# Patient Record
Sex: Female | Born: 1945
Health system: Southern US, Community
[De-identification: ages and names within clinical notes are randomized; demographics above are authoritative.]

## PROBLEM LIST (undated history)

## (undated) DIAGNOSIS — IMO0001 Reserved for inherently not codable concepts without codable children: Secondary | ICD-10-CM

## (undated) DIAGNOSIS — M1990 Unspecified osteoarthritis, unspecified site: Secondary | ICD-10-CM

## (undated) DIAGNOSIS — N83209 Unspecified ovarian cyst, unspecified side: Secondary | ICD-10-CM

## (undated) DIAGNOSIS — K922 Gastrointestinal hemorrhage, unspecified: Secondary | ICD-10-CM

## (undated) DIAGNOSIS — K219 Gastro-esophageal reflux disease without esophagitis: Secondary | ICD-10-CM

## (undated) DIAGNOSIS — Z8711 Personal history of peptic ulcer disease: Secondary | ICD-10-CM

## (undated) DIAGNOSIS — E119 Type 2 diabetes mellitus without complications: Secondary | ICD-10-CM

## (undated) DIAGNOSIS — H269 Unspecified cataract: Secondary | ICD-10-CM

## (undated) DIAGNOSIS — T7840XA Allergy, unspecified, initial encounter: Secondary | ICD-10-CM

## (undated) DIAGNOSIS — Z972 Presence of dental prosthetic device (complete) (partial): Secondary | ICD-10-CM

## (undated) DIAGNOSIS — Z8719 Personal history of other diseases of the digestive system: Secondary | ICD-10-CM

## (undated) DIAGNOSIS — E039 Hypothyroidism, unspecified: Secondary | ICD-10-CM

## (undated) DIAGNOSIS — Z974 Presence of external hearing-aid: Secondary | ICD-10-CM

## (undated) DIAGNOSIS — D509 Iron deficiency anemia, unspecified: Secondary | ICD-10-CM

## (undated) DIAGNOSIS — K08109 Complete loss of teeth, unspecified cause, unspecified class: Secondary | ICD-10-CM

## (undated) DIAGNOSIS — N83201 Unspecified ovarian cyst, right side: Secondary | ICD-10-CM

## (undated) DIAGNOSIS — E785 Hyperlipidemia, unspecified: Secondary | ICD-10-CM

## (undated) DIAGNOSIS — N9489 Other specified conditions associated with female genital organs and menstrual cycle: Secondary | ICD-10-CM

## (undated) DIAGNOSIS — Z531 Procedure and treatment not carried out because of patient's decision for reasons of belief and group pressure: Secondary | ICD-10-CM

## (undated) DIAGNOSIS — I10 Essential (primary) hypertension: Secondary | ICD-10-CM

## (undated) DIAGNOSIS — F329 Major depressive disorder, single episode, unspecified: Secondary | ICD-10-CM

## (undated) DIAGNOSIS — R011 Cardiac murmur, unspecified: Secondary | ICD-10-CM

## (undated) HISTORY — DX: Gastrointestinal hemorrhage, unspecified: K92.2

## (undated) HISTORY — DX: Unspecified ovarian cyst, unspecified side: N83.209

## (undated) HISTORY — PX: OTHER SURGICAL HISTORY: SHX169

## (undated) HISTORY — DX: Type 2 diabetes mellitus without complications: E11.9

## (undated) HISTORY — PX: BREAST BIOPSY: SHX20

## (undated) HISTORY — DX: Essential (primary) hypertension: I10

## (undated) HISTORY — DX: Hyperlipidemia, unspecified: E78.5

## (undated) HISTORY — DX: Allergy, unspecified, initial encounter: T78.40XA

## (undated) HISTORY — PX: TONSILLECTOMY: SUR1361

## (undated) HISTORY — PX: WRIST SURGERY: SHX841

## (undated) HISTORY — DX: Unspecified cataract: H26.9

## (undated) HISTORY — PX: WISDOM TOOTH EXTRACTION: SHX21

## (undated) HISTORY — DX: Cardiac murmur, unspecified: R01.1

## (undated) HISTORY — DX: Iron deficiency anemia, unspecified: D50.9

## (undated) HISTORY — DX: Hypothyroidism, unspecified: E03.9

## (undated) HISTORY — PX: DILATION AND CURETTAGE OF UTERUS: SHX78

---

## 1988-01-12 HISTORY — PX: TUBAL LIGATION: SHX77

## 2001-01-11 HISTORY — PX: KNEE ARTHROSCOPY: SUR90

## 2011-01-12 HISTORY — PX: WRIST FRACTURE SURGERY: SHX121

## 2013-10-23 DIAGNOSIS — E785 Hyperlipidemia, unspecified: Secondary | ICD-10-CM | POA: Insufficient documentation

## 2016-11-17 DIAGNOSIS — M545 Low back pain, unspecified: Secondary | ICD-10-CM | POA: Insufficient documentation

## 2017-01-11 HISTORY — PX: COLONOSCOPY: SHX174

## 2017-05-24 DIAGNOSIS — M542 Cervicalgia: Secondary | ICD-10-CM | POA: Insufficient documentation

## 2018-02-10 DIAGNOSIS — F32A Depression, unspecified: Secondary | ICD-10-CM | POA: Insufficient documentation

## 2018-02-10 DIAGNOSIS — M1990 Unspecified osteoarthritis, unspecified site: Secondary | ICD-10-CM | POA: Insufficient documentation

## 2018-03-22 DIAGNOSIS — N83201 Unspecified ovarian cyst, right side: Secondary | ICD-10-CM | POA: Insufficient documentation

## 2018-12-11 ENCOUNTER — Telehealth: Payer: Self-pay | Admitting: Family Medicine

## 2018-12-14 ENCOUNTER — Ambulatory Visit: Payer: Self-pay | Admitting: Family Medicine

## 2018-12-14 ENCOUNTER — Encounter: Payer: Self-pay | Admitting: Family Medicine

## 2018-12-14 ENCOUNTER — Ambulatory Visit (INDEPENDENT_AMBULATORY_CARE_PROVIDER_SITE_OTHER): Payer: Medicare PPO | Admitting: Family Medicine

## 2018-12-14 ENCOUNTER — Other Ambulatory Visit: Payer: Self-pay

## 2018-12-14 VITALS — BP 130/78 | HR 98 | Ht 62.0 in | Wt 202.0 lb

## 2018-12-14 DIAGNOSIS — F320 Major depressive disorder, single episode, mild: Secondary | ICD-10-CM | POA: Diagnosis not present

## 2018-12-14 DIAGNOSIS — I10 Essential (primary) hypertension: Secondary | ICD-10-CM

## 2018-12-14 DIAGNOSIS — E039 Hypothyroidism, unspecified: Secondary | ICD-10-CM | POA: Diagnosis not present

## 2018-12-14 DIAGNOSIS — F5101 Primary insomnia: Secondary | ICD-10-CM

## 2018-12-14 DIAGNOSIS — IMO0001 Reserved for inherently not codable concepts without codable children: Secondary | ICD-10-CM | POA: Insufficient documentation

## 2018-12-14 DIAGNOSIS — E559 Vitamin D deficiency, unspecified: Secondary | ICD-10-CM

## 2018-12-14 DIAGNOSIS — E7800 Pure hypercholesterolemia, unspecified: Secondary | ICD-10-CM | POA: Insufficient documentation

## 2018-12-14 DIAGNOSIS — E118 Type 2 diabetes mellitus with unspecified complications: Secondary | ICD-10-CM

## 2018-12-14 DIAGNOSIS — Z789 Other specified health status: Secondary | ICD-10-CM

## 2018-12-14 DIAGNOSIS — Z531 Procedure and treatment not carried out because of patient's decision for reasons of belief and group pressure: Secondary | ICD-10-CM | POA: Insufficient documentation

## 2018-12-14 DIAGNOSIS — D509 Iron deficiency anemia, unspecified: Secondary | ICD-10-CM

## 2018-12-14 MED ORDER — TRAZODONE HCL 100 MG PO TABS: 100.0000 mg | ORAL_TABLET | Freq: Every day | ORAL | 1 refills | Status: DC

## 2018-12-14 NOTE — Progress Notes (Signed)
Established Patient Office Visit  Subjective:  Patient ID: Dana Nielsen, female    DOB: [birth year]  Age: 73 y.o. MRN: [id number]  CC:  Chief Complaint  Patient presents with  . Establish Care    HPI [name] presents for establishment of care.  Recently moved into the area in the last month or 2 from [facility].  She lives with her son and his wife.  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  She has donated over 2000 masks that she is made to area hospitals and charities.  Significant past medical history of hypertension, diabetes that is well controlled, elevated cholesterol, hypothyroidism, iron deficiency anemia, and vitamin D deficiency.  Blood pressure has been traditionally well controlled on her current regimen.  She uses the Lasix daily for swelling in her lower extremities.  She eats a banana daily in place of a potassium pill.  Diabetes has been well controlled with the Glucophage and Victoza.  She is trying to come off of the Victoza.  She prefers the trade Synthroid for control of her hypothyroidism.  It is worked better for her.  Hemoglobin has been down to 5 in the past and appears to be iron deficient.  She has had multiple colonoscopies and swallowed the pill camera.  No lesions have been found.  She has refused blood transfusion secondary to her religious convictions.  History of depression associated with her late husband's passing 5 years ago.  She feels like she could not come off of the low-dose Effexor.  She has taken trazodone for years and that is effective for her.  History reviewed. No pertinent past medical history.  History reviewed. No pertinent surgical history.  History reviewed. No pertinent family history.  Social History   Socioeconomic History  . Marital status: Widowed    Spouse name: Not on file  . Number of children: Not on file  . Years of education: Not on file  . Highest education level: Not on file   Occupational History  . Not on file  Social Needs  . Financial resource strain: Not on file  . Food insecurity    Worry: Not on file    Inability: Not on file  . Transportation needs    Medical: Not on file    Non-medical: Not on file  Tobacco Use  . Smoking status: Never Smoker  . Smokeless tobacco: Never Used  Substance and Sexual Activity  . Alcohol use: Not on file  . Drug use: Not on file  . Sexual activity: Not on file  Lifestyle  . Physical activity    Days per week: Not on file    Minutes per session: Not on file  . Stress: Not on file  Relationships  . Social Herbalist on phone: Not on file    Gets together: Not on file    Attends religious service: Not on file    Active member of club or organization: Not on file    Attends meetings of clubs or organizations: Not on file    Relationship status: Not on file  . Intimate partner violence    Fear of current or ex partner: Not on file    Emotionally abused: Not on file    Physically abused: Not on file    Forced sexual activity: Not on file  Other Topics Concern  . Not on file  Social History Narrative  . Not on file  Outpatient Medications Prior to Visit  Medication Sig Dispense Refill  . B Complex Vitamins (B COMPLEX 100 PO) Take 1 tablet by mouth daily.    . Cholecalciferol (VITAMIN D-3) 25 MCG (1000 UT) CAPS Take 1 capsule by mouth daily.    . furosemide (LASIX) 20 MG tablet Take 20 mg by mouth daily.    Marland Kitchen losartan (COZAAR) 25 MG tablet Take 25 mg by mouth daily.    . metFORMIN (GLUCOPHAGE) 1000 MG tablet Take 1,000 mg by mouth 2 (two) times daily with a meal.    . Multiple Vitamins-Minerals (CENTRUM SILVER 50+WOMEN) TABS Take 1 tablet by mouth daily.    . pantoprazole (PROTONIX) 20 MG tablet Take 20 mg by mouth daily.    . simvastatin (ZOCOR) 40 MG tablet Take 40 mg by mouth daily.    Marland Kitchen SYNTHROID 100 MCG tablet Take 100 mcg by mouth daily before breakfast.    . venlafaxine (EFFEXOR) 75 MG  tablet Take 75 mg by mouth 3 (three) times daily with meals.    . traZODone (DESYREL) 100 MG tablet Take 100 mg by mouth at bedtime.     No facility-administered medications prior to visit.     Allergies  Allergen Reactions  . Lidocaine Shortness Of Breath    ROS Review of Systems  Constitutional: Negative for chills, diaphoresis, fatigue, fever and unexpected weight change.  HENT: Negative.   Eyes: Positive for visual disturbance (bilateral cataracts). Negative for photophobia.  Respiratory: Negative.  Negative for chest tightness and shortness of breath.   Cardiovascular: Negative for chest pain and palpitations.  Gastrointestinal: Negative.  Negative for abdominal pain, anal bleeding and blood in stool.  Endocrine: Negative for cold intolerance, heat intolerance, polyphagia and polyuria.  Genitourinary: Negative for difficulty urinating, hematuria and urgency.  Musculoskeletal: Positive for arthralgias (knee pain).  Skin: Negative for pallor and rash.  Allergic/Immunologic: Negative for immunocompromised state.  Neurological: Negative for light-headedness and numbness.  Hematological: Does not bruise/bleed easily.  Psychiatric/Behavioral: Negative.       Objective:    Physical Exam  Constitutional: She is oriented to person, place, and time. She appears well-developed and well-nourished. No distress.  HENT:  Head: Normocephalic and atraumatic.  Right Ear: External ear normal.  Left Ear: External ear normal.  Mouth/Throat: Oropharynx is clear and moist. No oropharyngeal exudate.  Eyes: Pupils are equal, round, and reactive to light. Conjunctivae are normal. Right eye exhibits no discharge. Left eye exhibits no discharge. No scleral icterus.  Neck: Neck supple. No JVD present. No tracheal deviation present. No thyromegaly present.  Cardiovascular: Normal rate, regular rhythm and normal heart sounds.  Pulmonary/Chest: Effort normal and breath sounds normal. No stridor.   Abdominal: Bowel sounds are normal.  Musculoskeletal:        General: No edema.  Lymphadenopathy:    She has no cervical adenopathy.  Neurological: She is alert and oriented to person, place, and time.  Skin: Skin is warm and dry. She is not diaphoretic.  Psychiatric: She has a normal mood and affect. Her behavior is normal.    BP 130/78   Pulse 98   Ht _0  (1.575 m)   Wt 202 lb (91.6 kg)   SpO2 98%   BMI 36.95 kg/m  Wt Readings from Last 3 Encounters:  12/14/18 202 lb (91.6 kg)   BP Readings from Last 3 Encounters:  12/14/18 130/78   Guideline developer:  UpToDate (see UpToDate for funding source) Date Released: June 2014  Health Maintenance Due  Topic Date Due  . HEMOGLOBIN A1C  August 27, 1945  . Hepatitis C Screening  1945/06/01  . FOOT EXAM  01/13/1955  . OPHTHALMOLOGY EXAM  01/13/1955  . TETANUS/TDAP  01/13/1964  . MAMMOGRAM  01/13/1995  . COLONOSCOPY  01/13/1995  . DEXA SCAN  01/12/2010  . PNA vac Low Risk Adult (1 of 2 - PCV13) 01/12/2010    There are no preventive care reminders to display for this patient.  No results found for: TSH No results found for: WBC, HGB, HCT, MCV, PLT No results found for: NA, K, CHLORIDE, CO2, GLUCOSE, BUN, CREATININE, BILITOT, ALKPHOS, AST, ALT, PROT, ALBUMIN, CALCIUM, ANIONGAP, EGFR, GFR No results found for: CHOL No results found for: HDL No results found for: LDLCALC No results found for: TRIG No results found for: CHOLHDL No results found for: HGBA1C    Assessment & Plan:   Problem List Items Addressed This Visit      Cardiovascular and Mediastinum   Essential hypertension - Primary   Relevant Medications   losartan (COZAAR) 25 MG tablet   furosemide (LASIX) 20 MG tablet   simvastatin (ZOCOR) 40 MG tablet   Other Relevant Orders   CBC   Comp Met (CMET)   Urinalysis, Routine w reflex microscopic   Urine Microalbumin w/creat. ratio     Endocrine   Hypothyroidism   Relevant Medications   SYNTHROID 100 MCG  tablet   Other Relevant Orders   TSH   Controlled type 2 diabetes mellitus with complication, without long-term current use of insulin (HCC)   Relevant Medications   metFORMIN (GLUCOPHAGE) 1000 MG tablet   losartan (COZAAR) 25 MG tablet   simvastatin (ZOCOR) 40 MG tablet   Other Relevant Orders   CBC   Comp Met (CMET)   HgB A1c   Urinalysis, Routine w reflex microscopic   Urine Microalbumin w/creat. ratio   Ambulatory referral to Ophthalmology     Other   Primary insomnia   Relevant Medications   traZODone (DESYREL) 100 MG tablet   Patient is Jehovah's Witness   Iron deficiency anemia   Relevant Orders   CBC   Iron, TIBC and Ferritin Panel   Depression, major, single episode, mild (HCC)   Relevant Medications   venlafaxine (EFFEXOR) 75 MG tablet   traZODone (DESYREL) 100 MG tablet   Vitamin D deficiency   Relevant Orders   VITAMIN D 25 Hydroxy (Vit-D Deficiency, Fractures)   Elevated cholesterol   Relevant Medications   losartan (COZAAR) 25 MG tablet   furosemide (LASIX) 20 MG tablet   simvastatin (ZOCOR) 40 MG tablet   Other Relevant Orders   Comp Met (CMET)   Direct LDL   Lipid Profile      Meds ordered this encounter  Medications  . traZODone (DESYREL) 100 MG tablet    Sig: Take 1 tablet (100 mg total) by mouth at bedtime.    Dispense:  90 tablet    Refill:  1    Follow-up: Return in about 3 months (around 03/14/2019).   Will return fasting for above ordered blood work.

## 2018-12-14 NOTE — Patient Instructions (Signed)

## 2018-12-15 ENCOUNTER — Other Ambulatory Visit (INDEPENDENT_AMBULATORY_CARE_PROVIDER_SITE_OTHER): Payer: Medicare PPO

## 2018-12-15 DIAGNOSIS — E7800 Pure hypercholesterolemia, unspecified: Secondary | ICD-10-CM

## 2018-12-15 DIAGNOSIS — E559 Vitamin D deficiency, unspecified: Secondary | ICD-10-CM | POA: Diagnosis not present

## 2018-12-15 DIAGNOSIS — D509 Iron deficiency anemia, unspecified: Secondary | ICD-10-CM

## 2018-12-15 DIAGNOSIS — I10 Essential (primary) hypertension: Secondary | ICD-10-CM

## 2018-12-15 DIAGNOSIS — E039 Hypothyroidism, unspecified: Secondary | ICD-10-CM | POA: Diagnosis not present

## 2018-12-15 DIAGNOSIS — E118 Type 2 diabetes mellitus with unspecified complications: Secondary | ICD-10-CM | POA: Diagnosis not present

## 2018-12-15 LAB — HEMOGLOBIN A1C: Hgb A1c MFr Bld: 6.3 % (ref 4.6–6.5)

## 2018-12-15 LAB — LIPID PANEL
Cholesterol: 127 mg/dL (ref 0–200)
HDL: 35.9 mg/dL — ABNORMAL LOW (ref >39.00)
LDL Cholesterol: 69 mg/dL (ref 0–99)
NonHDL: 91.16
Total CHOL/HDL Ratio: 4
Triglycerides: 110 mg/dL (ref 0.0–149.0)
VLDL: 22 mg/dL (ref 0.0–40.0)

## 2018-12-15 LAB — COMPREHENSIVE METABOLIC PANEL
ALT: 29 U/L (ref 0–35)
AST: 20 U/L (ref 0–37)
Albumin: 4.3 g/dL (ref 3.5–5.2)
Alkaline Phosphatase: 50 U/L (ref 39–117)
BUN: 21 mg/dL (ref 6–23)
CO2: 25 mEq/L (ref 19–32)
Calcium: 9.3 mg/dL (ref 8.4–10.5)
Chloride: 106 mEq/L (ref 96–112)
Creatinine, Ser: 0.84 mg/dL (ref 0.40–1.20)
GFR: 66.29 mL/min (ref >60.00)
Glucose, Bld: 117 mg/dL — ABNORMAL HIGH (ref 70–99)
Potassium: 4 mEq/L (ref 3.5–5.1)
Sodium: 141 mEq/L (ref 135–145)
Total Bilirubin: 0.5 mg/dL (ref 0.2–1.2)
Total Protein: 6.4 g/dL (ref 6.0–8.3)

## 2018-12-15 LAB — CBC
HCT: 34.7 % — ABNORMAL LOW (ref 36.0–46.0)
Hemoglobin: 11.5 g/dL — ABNORMAL LOW (ref 12.0–15.0)
MCHC: 33 g/dL (ref 30.0–36.0)
MCV: 89.5 fl (ref 78.0–100.0)
Platelets: 196 10*3/uL (ref 150.0–400.0)
RBC: 3.88 Mil/uL (ref 3.87–5.11)
RDW: 14.2 % (ref 11.5–15.5)
WBC: 5.8 10*3/uL (ref 4.0–10.5)

## 2018-12-15 LAB — URINALYSIS, ROUTINE W REFLEX MICROSCOPIC
Bilirubin Urine: NEGATIVE
Hgb urine dipstick: NEGATIVE
Ketones, ur: NEGATIVE
Nitrite: NEGATIVE
RBC / HPF: NONE SEEN (ref 0–?)
Specific Gravity, Urine: 1.015 (ref 1.000–1.030)
Total Protein, Urine: NEGATIVE
Urine Glucose: NEGATIVE
Urobilinogen, UA: 0.2 (ref 0.0–1.0)
pH: 6 (ref 5.0–8.0)

## 2018-12-15 LAB — MICROALBUMIN / CREATININE URINE RATIO
Creatinine,U: 110.7 mg/dL
Microalb Creat Ratio: 3 mg/g (ref 0.0–30.0)
Microalb, Ur: 3.4 mg/dL — ABNORMAL HIGH (ref 0.0–1.9)

## 2018-12-15 LAB — LDL CHOLESTEROL, DIRECT: Direct LDL: 76 mg/dL

## 2018-12-15 LAB — TSH: TSH: 3.59 u[IU]/mL (ref 0.35–4.50)

## 2018-12-15 LAB — VITAMIN D 25 HYDROXY (VIT D DEFICIENCY, FRACTURES): VITD: 34.37 ng/mL (ref 30.00–100.00)

## 2018-12-16 LAB — IRON,TIBC AND FERRITIN PANEL
%SAT: 25 % (calc) (ref 16–45)
Ferritin: 368 ng/mL — ABNORMAL HIGH (ref 16–288)
Iron: 78 ug/dL (ref 45–160)
TIBC: 306 mcg/dL (calc) (ref 250–450)

## 2018-12-18 ENCOUNTER — Telehealth: Payer: Self-pay | Admitting: Family Medicine

## 2018-12-18 NOTE — Telephone Encounter (Signed)
Labs are okay. Continue all meds.

## 2018-12-18 NOTE — Telephone Encounter (Signed)
Patient inquiring about lab results states she needs to know today due to her symptoms not improving, please advise

## 2018-12-18 NOTE — Telephone Encounter (Signed)
WK-Plz see pt message/requesting results from labs completed on 12.4.20/plz advise/thx dmf

## 2018-12-18 NOTE — Telephone Encounter (Signed)
Patient calling for lab results from 12/15/2018

## 2018-12-18 NOTE — Telephone Encounter (Signed)
WK- 2nd call about labs from 12.4.20/states that her Sx are not improving/thx dmf

## 2018-12-18 NOTE — Telephone Encounter (Signed)
Pt calling again for lab results. Aware that we will call her as soon as these are resulted - aware Dr Ethelene Hal is in clinic and someone will call her as soon as these are looked at.

## 2018-12-19 ENCOUNTER — Telehealth: Payer: Self-pay | Admitting: Family Medicine

## 2018-12-19 NOTE — Telephone Encounter (Signed)
Called pt and informed her of Dr. Bebe Shaggy message. Pt understood and states she is already taking an iron and multivitamin. When I attempted to schedule pt for her lab visit, we got disconnected. Sent pt a mychart message to schedule lab visit when she can.

## 2018-12-19 NOTE — Telephone Encounter (Signed)
Dr. Ethelene Hal, I called and spoke with pt about her lab result. She is concerned about her hemoglobin level. She noticed it dropped from 13.5 to 11.5. She states in the past this level has dropped as low as in the 5s she wanted to know if this could be repeated this week to assure that it is staying steady in the 11s or not dropping any lower. Also she wanted to know if she could have a referral to a hematologist.

## 2018-12-19 NOTE — Telephone Encounter (Signed)
I LMOVM stating that all labs look good and to continue current regimen/thx dmf

## 2018-12-19 NOTE — Telephone Encounter (Signed)
Lets repeat it a few weeks. Please take iron and a multivitamin.

## 2018-12-20 NOTE — Telephone Encounter (Signed)
Pt responded to [employer]. Closing out this note

## 2019-01-02 ENCOUNTER — Telehealth: Payer: Self-pay | Admitting: Hematology and Oncology

## 2019-01-02 ENCOUNTER — Other Ambulatory Visit: Payer: Self-pay

## 2019-01-02 DIAGNOSIS — D509 Iron deficiency anemia, unspecified: Secondary | ICD-10-CM

## 2019-01-02 NOTE — Telephone Encounter (Signed)
Just the cbc

## 2019-01-02 NOTE — Telephone Encounter (Signed)
Order has been placed.

## 2019-01-02 NOTE — Telephone Encounter (Signed)
Dr. Ethelene Hal pt is coming for that repeat lab tomorrow due to her concern about her hemoglobin level from her cbc. Did you just want to repeat the cbc or did you want to do a different iron lab.

## 2019-01-02 NOTE — Telephone Encounter (Signed)
A new hem appt has been scheduled for Dana Nielsen to see Dr. Lorenso Courier on 12/7 at 1pm. Pt aware to arrive 15 minutes early.

## 2019-01-03 ENCOUNTER — Other Ambulatory Visit (INDEPENDENT_AMBULATORY_CARE_PROVIDER_SITE_OTHER): Payer: Medicare PPO

## 2019-01-03 ENCOUNTER — Other Ambulatory Visit: Payer: Self-pay

## 2019-01-03 DIAGNOSIS — D509 Iron deficiency anemia, unspecified: Secondary | ICD-10-CM

## 2019-01-03 LAB — CBC
HCT: 37.6 % (ref 36.0–46.0)
Hemoglobin: 12.2 g/dL (ref 12.0–15.0)
MCHC: 32.4 g/dL (ref 30.0–36.0)
MCV: 91.1 fl (ref 78.0–100.0)
Platelets: 193 10*3/uL (ref 150.0–400.0)
RBC: 4.13 Mil/uL (ref 3.87–5.11)
RDW: 13.9 % (ref 11.5–15.5)
WBC: 6.3 10*3/uL (ref 4.0–10.5)

## 2019-01-18 ENCOUNTER — Inpatient Hospital Stay: Payer: Medicare HMO | Attending: Hematology and Oncology | Admitting: Hematology and Oncology

## 2019-01-18 ENCOUNTER — Inpatient Hospital Stay: Payer: Medicare HMO

## 2019-01-18 ENCOUNTER — Other Ambulatory Visit: Payer: Self-pay

## 2019-01-18 VITALS — BP 154/82 | HR 83 | Temp 97.4°F | Resp 18 | Ht 62.0 in | Wt 202.6 lb

## 2019-01-18 DIAGNOSIS — E039 Hypothyroidism, unspecified: Secondary | ICD-10-CM | POA: Diagnosis not present

## 2019-01-18 DIAGNOSIS — E119 Type 2 diabetes mellitus without complications: Secondary | ICD-10-CM | POA: Insufficient documentation

## 2019-01-18 DIAGNOSIS — E785 Hyperlipidemia, unspecified: Secondary | ICD-10-CM | POA: Diagnosis not present

## 2019-01-18 DIAGNOSIS — Z79899 Other long term (current) drug therapy: Secondary | ICD-10-CM | POA: Insufficient documentation

## 2019-01-18 DIAGNOSIS — Z7984 Long term (current) use of oral hypoglycemic drugs: Secondary | ICD-10-CM | POA: Diagnosis not present

## 2019-01-18 DIAGNOSIS — D500 Iron deficiency anemia secondary to blood loss (chronic): Secondary | ICD-10-CM

## 2019-01-18 DIAGNOSIS — D509 Iron deficiency anemia, unspecified: Secondary | ICD-10-CM | POA: Diagnosis not present

## 2019-01-18 LAB — CBC WITH DIFFERENTIAL (CANCER CENTER ONLY)
Abs Immature Granulocytes: 0.03 10*3/uL (ref 0.00–0.07)
Basophils Absolute: 0.1 10*3/uL (ref 0.0–0.1)
Basophils Relative: 1 %
Eosinophils Absolute: 0.2 10*3/uL (ref 0.0–0.5)
Eosinophils Relative: 4 %
HCT: 38.9 % (ref 36.0–46.0)
Hemoglobin: 12.5 g/dL (ref 12.0–15.0)
Immature Granulocytes: 1 %
Lymphocytes Relative: 36 %
Lymphs Abs: 2.2 10*3/uL (ref 0.7–4.0)
MCH: 29.8 pg (ref 26.0–34.0)
MCHC: 32.1 g/dL (ref 30.0–36.0)
MCV: 92.6 fL (ref 80.0–100.0)
Monocytes Absolute: 0.6 10*3/uL (ref 0.1–1.0)
Monocytes Relative: 9 %
Neutro Abs: 3.1 10*3/uL (ref 1.7–7.7)
Neutrophils Relative %: 49 %
Platelet Count: 193 10*3/uL (ref 150–400)
RBC: 4.2 MIL/uL (ref 3.87–5.11)
RDW: 13.3 % (ref 11.5–15.5)
WBC Count: 6.2 10*3/uL (ref 4.0–10.5)
nRBC: 0 % (ref 0.0–0.2)

## 2019-01-18 NOTE — Progress Notes (Signed)
[place] [phone]   Fax:([phone] [facility] NOTE  Patient Care Team: Libby Maw, MD as PCP - General (Family Medicine)  Hematological/Oncological History #Iron Deficiency Anemia 1) 2019: presented to [facility] in [facility] with symptoms of anemia. Found to have Hgb 5.0. EGD/colon/capsule reported found no source of bleed 2) 01/18/2019: establish care with Dr. Lorenso Courier   CHIEF COMPLAINTS/PURPOSE OF CONSULTATION:  History of Iron Deficiency anemia   HISTORY OF PRESENTING ILLNESS:  Dana Nielsen 74 y.o. female with medical history significant for hypothyroidism, DM type II, and HLD who presents for evaluation of her history of iron deficiency anemia.  No records are currently available from when patient the patient lived in [place].  All medical history is obtained from the patient herself rather than from concrete medical records.  We are in the process of attempting to obtain these records from [employer] and [employer].  The patient reports that she has a history of iron deficiency anemia.  She notes that she had a severe episode in 2019 when she presented to a local hospital with hemoglobin had dropped to 5.  She underwent 2 colonoscopies and 2 endoscopies over 2-week period and was found to have no clear source of bleeding.  She also endorses that she underwent a capsule endoscopy which showed no signs of a GI bleed.  On discussion today the patient notes that she feels well.  She denies having any shortness of breath, overt signs of bleeding, dark stools, or other signs of GI bleed.  She is currently taking Floradix over-the-counter for iron supplementation.  She notes that she has been having issues with constipation her entire life.  For which she now takes milk of magnesia.  Other medical conditions she have includes hypothyroidism for which she is attempting to get established with an endocrinologist  and type 2 diabetes which is currently under control with diet.  A full 10 point ROS is listed below.  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  She was expressly clear with me that even in life-threatening situations she would avoid the transfusion of blood.  She also notes that she is a never smoker but occasionally drinks alcohol.  Her family history is remarkable for multiple myeloma in her mother lung cancer in her sister and numerous cancer in her father's siblings.  She notes that she has 9 children 2 of which are adopted and they are all healthy.  MEDICAL HISTORY:  Past Medical History:  Diagnosis Date  . Diabetes mellitus type 2, diet-controlled ([place])   . HLD (hyperlipidemia)   . Hypothyroidism   . Iron deficiency anemia     SURGICAL HISTORY: Past Surgical History:  Procedure Laterality Date  . arthroscopic knee    . C sections     x 7  . TONSILLECTOMY    . WISDOM TOOTH EXTRACTION    . WRIST SURGERY      SOCIAL HISTORY: Social History   Socioeconomic History  . Marital status: Widowed    Spouse name: Not on file  . Number of children: 77  . Years of education: Not on file  . Highest education level: Not on file  Occupational History  . Not on file  Tobacco Use  . Smoking status: Never Smoker  . Smokeless tobacco: Never Used  Substance and Sexual Activity  . Alcohol use: Yes  Comment: Rarely  . Drug use: Never  . Sexual activity: Not on file  Other Topics Concern  . Not on file  Social History Narrative  . Not on file   Social Determinants of Health   Financial Resource Strain:   . Difficulty of Paying Living Expenses: Not on file  Food Insecurity:   . Worried About Charity fundraiser in the Last Year: Not on file  . Ran Out of Food in the Last Year: Not on file  Transportation Needs:   . Lack of Transportation (Medical): Not on file  . Lack of Transportation  (Non-Medical): Not on file  Physical Activity:   . Days of Exercise per Week: Not on file  . Minutes of Exercise per Session: Not on file  Stress:   . Feeling of Stress : Not on file  Social Connections:   . Frequency of Communication with Friends and Family: Not on file  . Frequency of Social Gatherings with Friends and Family: Not on file  . Attends Religious Services: Not on file  . Active Member of Clubs or Organizations: Not on file  . Attends Archivist Meetings: Not on file  . Marital Status: Not on file  Intimate Partner Violence:   . Fear of Current or Ex-Partner: Not on file  . Emotionally Abused: Not on file  . Physically Abused: Not on file  . Sexually Abused: Not on file    FAMILY HISTORY: Family History  Problem Relation Age of Onset  . Multiple myeloma Mother   . Heart attack Father   . Lung cancer Sister     ALLERGIES:  is allergic to lidocaine.  MEDICATIONS:  Current Outpatient Medications  Medication Sig Dispense Refill  . liraglutide (VICTOZA) 18 MG/3ML SOPN Inject 1.8 mg into the skin every morning.    . B Complex Vitamins (B COMPLEX 100 PO) Take 1 tablet by mouth daily.    . Cholecalciferol (VITAMIN D-3) 25 MCG (1000 UT) CAPS Take 1 capsule by mouth daily.    . furosemide (LASIX) 20 MG tablet Take 20 mg by mouth daily.    Marland Kitchen losartan (COZAAR) 25 MG tablet Take 25 mg by mouth daily.    . metFORMIN (GLUCOPHAGE) 1000 MG tablet Take 1,000 mg by mouth 2 (two) times daily with a meal.    . Multiple Vitamins-Minerals (CENTRUM SILVER 50+WOMEN) TABS Take 1 tablet by mouth daily.    . pantoprazole (PROTONIX) 20 MG tablet Take 20 mg by mouth daily.    . simvastatin (ZOCOR) 40 MG tablet Take 40 mg by mouth daily.    Marland Kitchen SYNTHROID 100 MCG tablet Take 100 mcg by mouth daily before breakfast.    . traZODone (DESYREL) 100 MG tablet Take 1 tablet (100 mg total) by mouth at bedtime. 90 tablet 1  . venlafaxine (EFFEXOR) 75 MG tablet Take 75 mg by mouth 3 (three)  times daily with meals.     No current facility-administered medications for this visit.    REVIEW OF SYSTEMS:   Constitutional: ( - ) fevers, ( - )  chills , ( - ) night sweats Eyes: ( - ) blurriness of vision, ( - ) double vision, ( - ) watery eyes Ears, nose, mouth, throat, and face: ( - ) mucositis, ( - ) sore throat Respiratory: ( - ) cough, ( - ) dyspnea, ( - ) wheezes Cardiovascular: ( - ) palpitation, ( - ) chest discomfort, ( - ) lower extremity swelling Gastrointestinal:  ( - )  nausea, ( - ) heartburn, ( - ) change in bowel habits Skin: ( - ) abnormal skin rashes Lymphatics: ( - ) new lymphadenopathy, ( - ) easy bruising Neurological: ( - ) numbness, ( - ) tingling, ( - ) new weaknesses Behavioral/Psych: ( - ) mood change, ( - ) new changes  All other systems were reviewed with the patient and are negative.  PHYSICAL EXAMINATION: ECOG PERFORMANCE STATUS: 0 - Asymptomatic  Vitals:   01/18/19 1326  BP: (!) 154/82  Pulse: 83  Resp: 18  Temp: (!) 97.4 F (36.3 C)  SpO2: 98%   Filed Weights   01/18/19 1326  Weight: 202 lb 9.6 oz (91.9 kg)    GENERAL: well appearing elderly Caucasian female in NAD  SKIN: skin color, texture, turgor are normal, no rashes or significant lesions EYES: conjunctiva are pink and non-injected, sclera clear LUNGS: clear to auscultation and percussion with normal breathing effort HEART: regular rate & rhythm and no murmurs and no lower extremity edema ABDOMEN: soft, non-tender, non-distended, normal bowel sounds Musculoskeletal: no cyanosis of digits and no clubbing  PSYCH: alert & oriented x 3, fluent speech NEURO: no focal motor/sensory deficits  LABORATORY DATA:  I have reviewed the data as listed Recent Results (from the past 2160 hour(s))  VITAMIN D 25 Hydroxy (Vit-D Deficiency, Fractures)     Status: None   Collection Time: 12/15/18 10:58 AM  Result Value Ref Range   VITD 34.37 30.00 - 100.00 ng/mL  Urine Microalbumin w/creat.  ratio     Status: Abnormal   Collection Time: 12/15/18 10:58 AM  Result Value Ref Range   Microalb, Ur 3.4 (H) 0.0 - 1.9 mg/dL   Creatinine,U 110.7 mg/dL   Microalb Creat Ratio 3.0 0.0 - 30.0 mg/g  TSH     Status: None   Collection Time: 12/15/18 10:58 AM  Result Value Ref Range   TSH 3.59 0.35 - 4.50 uIU/mL  Urinalysis, Routine w reflex microscopic     Status: Abnormal   Collection Time: 12/15/18 10:58 AM  Result Value Ref Range   Color, Urine YELLOW Yellow;Lt. Yellow;Straw;Dark Yellow;Amber;Green;Red;Brown   APPearance CLEAR Clear;Turbid;Slightly Cloudy;Cloudy   Specific Gravity, Urine 1.015 1.000 - 1.030   pH 6.0 5.0 - 8.0   Total Protein, Urine NEGATIVE Negative   Urine Glucose NEGATIVE Negative   Ketones, ur NEGATIVE Negative   Bilirubin Urine NEGATIVE Negative   Hgb urine dipstick NEGATIVE Negative   Urobilinogen, UA 0.2 0.0 - 1.0   Leukocytes,Ua TRACE (A) Negative   Nitrite NEGATIVE Negative   WBC, UA 0-2/hpf 0-2/hpf   RBC / HPF none seen 0-2/hpf   Squamous Epithelial / LPF Rare(0-4/hpf) Rare(0-4/hpf)  Iron, TIBC and Ferritin Panel     Status: Abnormal   Collection Time: 12/15/18 10:58 AM  Result Value Ref Range   Iron 78 45 - 160 mcg/dL   TIBC 306 250 - 450 mcg/dL (calc)   %SAT 25 16 - 45 % (calc)   Ferritin 368 (H) 16 - 288 ng/mL  Lipid Profile     Status: Abnormal   Collection Time: 12/15/18 10:58 AM  Result Value Ref Range   Cholesterol 127 0 - 200 mg/dL    Comment: ATP III Classification       Desirable:  < 200 mg/dL               Borderline High:  200 - 239 mg/dL          High:  > = 240 mg/dL  Triglycerides 110.0 0.0 - 149.0 mg/dL    Comment: Normal:  <150 mg/dLBorderline High:  150 - 199 mg/dL   HDL 35.90 (L) >39.00 mg/dL   VLDL 22.0 0.0 - 40.0 mg/dL   LDL Cholesterol 69 0 - 99 mg/dL   Total CHOL/HDL Ratio 4     Comment:                Men          Women1/2 Average Risk     3.4          3.3Average Risk          5.0          4.42X Average Risk          9.6           7.13X Average Risk          15.0          11.0                       NonHDL 91.16     Comment: NOTE:  Non-HDL goal should be 30 mg/dL higher than patient's LDL goal (i.e. LDL goal of < 70 mg/dL, would have non-HDL goal of < 100 mg/dL)  HgB A1c     Status: None   Collection Time: 12/15/18 10:58 AM  Result Value Ref Range   Hgb A1c MFr Bld 6.3 4.6 - 6.5 %    Comment: Glycemic Control Guidelines for People with Diabetes:Non Diabetic:  <6%Goal of Therapy: <7%Additional Action Suggested:  >8%   Direct LDL     Status: None   Collection Time: 12/15/18 10:58 AM  Result Value Ref Range   Direct LDL 76.0 mg/dL    Comment: Optimal:  <100 mg/dLNear or Above Optimal:  100-129 mg/dLBorderline High:  130-159 mg/dLHigh:  160-189 mg/dLVery High:  >190 mg/dL  Comp Met (CMET)     Status: Abnormal   Collection Time: 12/15/18 10:58 AM  Result Value Ref Range   Sodium 141 135 - 145 mEq/L   Potassium 4.0 3.5 - 5.1 mEq/L   Chloride 106 96 - 112 mEq/L   CO2 25 19 - 32 mEq/L   Glucose, Bld 117 (H) 70 - 99 mg/dL   BUN 21 6 - 23 mg/dL   Creatinine, Ser 0.84 0.40 - 1.20 mg/dL   Total Bilirubin 0.5 0.2 - 1.2 mg/dL   Alkaline Phosphatase 50 39 - 117 U/L   AST 20 0 - 37 U/L   ALT 29 0 - 35 U/L   Total Protein 6.4 6.0 - 8.3 g/dL   Albumin 4.3 3.5 - 5.2 g/dL   GFR 66.29 >60.00 mL/min   Calcium 9.3 8.4 - 10.5 mg/dL  CBC     Status: Abnormal   Collection Time: 12/15/18 10:58 AM  Result Value Ref Range   WBC 5.8 4.0 - 10.5 K/uL   RBC 3.88 3.87 - 5.11 Mil/uL   Platelets 196.0 150.0 - 400.0 K/uL   Hemoglobin 11.5 (L) 12.0 - 15.0 g/dL   HCT 34.7 (L) 36.0 - 46.0 %   MCV 89.5 78.0 - 100.0 fl   MCHC 33.0 30.0 - 36.0 g/dL   RDW 14.2 11.5 - 15.5 %  CBC     Status: None   Collection Time: 01/03/19  2:12 PM  Result Value Ref Range   WBC 6.3 4.0 - 10.5 K/uL   RBC 4.13 3.87 - 5.11 Mil/uL   Platelets 193.0  150.0 - 400.0 K/uL   Hemoglobin 12.2 12.0 - 15.0 g/dL   HCT 37.6 36.0 - 46.0 %   MCV 91.1 78.0 - 100.0  fl   MCHC 32.4 30.0 - 36.0 g/dL   RDW 13.9 11.5 - 15.5 %  CBC with Differential (Cancer Center Only)     Status: None   Collection Time: 01/18/19  2:48 PM  Result Value Ref Range   WBC Count 6.2 4.0 - 10.5 K/uL   RBC 4.20 3.87 - 5.11 MIL/uL   Hemoglobin 12.5 12.0 - 15.0 g/dL   HCT 38.9 36.0 - 46.0 %   MCV 92.6 80.0 - 100.0 fL   MCH 29.8 26.0 - 34.0 pg   MCHC 32.1 30.0 - 36.0 g/dL   RDW 13.3 11.5 - 15.5 %   Platelet Count 193 150 - 400 K/uL   nRBC 0.0 0.0 - 0.2 %   Neutrophils Relative % 49 %   Neutro Abs 3.1 1.7 - 7.7 K/uL   Lymphocytes Relative 36 %   Lymphs Abs 2.2 0.7 - 4.0 K/uL   Monocytes Relative 9 %   Monocytes Absolute 0.6 0.1 - 1.0 K/uL   Eosinophils Relative 4 %   Eosinophils Absolute 0.2 0.0 - 0.5 K/uL   Basophils Relative 1 %   Basophils Absolute 0.1 0.0 - 0.1 K/uL   Immature Granulocytes 1 %   Abs Immature Granulocytes 0.03 0.00 - 0.07 K/uL    Comment: Performed at [facility], [place] [address] [place], [place] [address]    PATHOLOGY: None to review.   RADIOGRAPHIC STUDIES: None relevant to review.  ASSESSMENT & PLAN Dana Nielsen 74 y.o. female with medical history significant for hypothyroidisim, DM type II, and HLD who presents for evaluation of her history of iron deficiency anemia.  After review of her labs from December discussion with the patient her findings are consistent with a normal and healthy level of hemoglobin and iron.  I have relayed this to the patient who notes that she is concerned about the prior episode of anemia that she had and would like to continue close monitoring to assure that she does not develop this anemia again.    The bulk of her concern comes and the fact that she is a Sales promotion account executive Witness and therefore not be able to receive any blood products in the event of a severe bleed.  As such I think it would be reasonable to assess her labs in approximately 3 months time with a clinic visit in 6 months time.   Although an individual who is otherwise healthy this is more frequent than may be required, the patient notes this would be her preference, to follow closely with a hematologist in the event that she were to develop any blood issues that someone could be familiar with her case.  I do believe at this time this is a reasonable course forward.  #History Iron Deficiency Anemia --the patient currently has no anemia with replete iron stores --the patient notes she is concerned about her prior drop in Hgb without a source and the fact that she is a Jehovah's Witness and could not receive blood products in the event of a severe anemia --as such she has requested routine monitoring of her blood. We agreed that q 3 months checks with an interval 6 month clinic visit would be appropriate. --RTC in 6 months with interval lab check in 3 months.   #Jehovah's Witness --the patient is not to receive blood  products under any circumstances, per her request. We have copied and uploaded her Blood Contract which she carries into her wallet into our system. --in the event the patient is admitted with severe anemia or bleed please make the Hematology service aware so that we may provide bloodless options for support.   Orders Placed This Encounter  Procedures  . CBC with Differential (Cancer Center Only)    Standing Status:   Future    Number of Occurrences:   1    Standing Expiration Date:   01/18/2020  . CBC with Differential (Cancer Center Only)    Standing Status:   Future    Standing Expiration Date:   01/21/2020  . Iron and TIBC    Standing Status:   Future    Standing Expiration Date:   01/21/2020  . Ferritin    Standing Status:   Future    Standing Expiration Date:   01/21/2020    All questions were answered. The patient knows to call the clinic with any problems, questions or concerns.  A total of more than 45 minutes were spent on this encounter and over half of that time was spent on counseling and  coordination of care as outlined above.   Ledell Peoples, MD Department of Hematology/Oncology [place] [facility] Phone: [phone] Pager: [phone] Email: [email or web address]  01/21/2019 4:25 PM

## 2019-01-21 ENCOUNTER — Encounter: Payer: Self-pay | Admitting: Hematology and Oncology

## 2019-01-22 ENCOUNTER — Telehealth: Payer: Self-pay | Admitting: *Deleted

## 2019-01-22 NOTE — Telephone Encounter (Signed)
-----   Message from Orson Slick, MD sent at 01/21/2019  3:58 PM EST ----- Eustaquio Maize,  Please let Mrs. Souter know her Hgb was normal. We will check her again in 3 months time with a repeat clinic visit in 6 months time.   Colan Neptune  ----- Message ----- From: Buel Ream, Lab In [place] Sent: 01/18/2019   2:57 PM EST To: Orson Slick, MD

## 2019-01-22 NOTE — Telephone Encounter (Signed)
TCT patient regarding her lab results from last week.  Spoke with pt.  Informed her that HGB was normal and that we would re-check her labs in 3 months and see her here in the clinic in 6 months.  She voiced understanding. Advised that she can call [phone] with any questions or concerns

## 2019-01-29 ENCOUNTER — Telehealth: Payer: Self-pay | Admitting: Family Medicine

## 2019-01-29 NOTE — Telephone Encounter (Signed)
Patient is calling and wanted to speak to Dr. Ethelene Hal regarding whether or not she should get the Covid 19 Vaccine. CB [phone]

## 2019-01-30 NOTE — Telephone Encounter (Signed)
Pt aware and will schedule to have covid vaccine.

## 2019-01-30 NOTE — Telephone Encounter (Signed)
Lidocaine will not be involved with her vaccine. Okay to take vaccine and please do.

## 2019-01-30 NOTE — Telephone Encounter (Signed)
Spoke with pt who states that she would like to speak with Dr. Ethelene Hal to know what is his thoughts on her having the covid vaccine due to her having a bad reaction to lidocaine in the past she would like to know if there were any extra precaution she needs to do? Please advise.

## 2019-02-14 DIAGNOSIS — H25813 Combined forms of age-related cataract, bilateral: Secondary | ICD-10-CM | POA: Diagnosis not present

## 2019-02-14 DIAGNOSIS — E119 Type 2 diabetes mellitus without complications: Secondary | ICD-10-CM | POA: Diagnosis not present

## 2019-02-14 DIAGNOSIS — H35371 Puckering of macula, right eye: Secondary | ICD-10-CM | POA: Diagnosis not present

## 2019-02-21 ENCOUNTER — Encounter (HOSPITAL_COMMUNITY): Payer: Self-pay | Admitting: Emergency Medicine

## 2019-02-21 ENCOUNTER — Ambulatory Visit (HOSPITAL_COMMUNITY)
Admission: EM | Admit: 2019-02-21 | Discharge: 2019-02-21 | Disposition: A | Discharge status: Home / Self Care | Payer: Medicare HMO | Attending: Urgent Care | Admitting: Urgent Care

## 2019-02-21 ENCOUNTER — Other Ambulatory Visit: Payer: Self-pay

## 2019-02-21 DIAGNOSIS — J3089 Other allergic rhinitis: Secondary | ICD-10-CM

## 2019-02-21 DIAGNOSIS — J0191 Acute recurrent sinusitis, unspecified: Secondary | ICD-10-CM

## 2019-02-21 MED ORDER — PREDNISONE 20 MG PO TABS: 20.0000 mg | ORAL_TABLET | Freq: Every day | ORAL | 0 refills | Status: DC

## 2019-02-21 MED ORDER — AMOXICILLIN 875 MG PO TABS: 875.0000 mg | ORAL_TABLET | Freq: Two times a day (BID) | ORAL | 0 refills | Status: DC

## 2019-02-21 NOTE — ED Triage Notes (Signed)
Headache and runny nose started last week. She got COVID vaccine Friday. Headache and runny nose started prior to vaccine. She now has left ear pain.

## 2019-02-21 NOTE — ED Provider Notes (Signed)
Dana Nielsen   MRN: [id number] DOB: [birth year]  Subjective:   Dana Nielsen is a 74 y.o. female presenting for 2 week hx of runny nose, sinus headaches, now having left ear pain with throat pain of the left side. Headache is superior/temporal. Has had to use APAP with temporary relief. She is coughing from post-nasal drainage. Has had difficulty with allergies, used to take cetirizine but has not started that this year. Denies direct COVID 19 contacts/exposure. Has practiced social distancing strictly.  No current facility-administered medications for this encounter.  Current Outpatient Medications:  .  B Complex Vitamins (B COMPLEX 100 PO), Take 1 tablet by mouth daily., Disp: , Rfl:  .  Cholecalciferol (VITAMIN D-3) 25 MCG (1000 UT) CAPS, Take 1 capsule by mouth daily., Disp: , Rfl:  .  furosemide (LASIX) 20 MG tablet, Take 20 mg by mouth daily., Disp: , Rfl:  .  liraglutide (VICTOZA) 18 MG/3ML SOPN, Inject 1.8 mg into the skin every morning., Disp: , Rfl:  .  losartan (COZAAR) 25 MG tablet, Take 25 mg by mouth daily., Disp: , Rfl:  .  metFORMIN (GLUCOPHAGE) 1000 MG tablet, Take 1,000 mg by mouth 2 (two) times daily with a meal., Disp: , Rfl:  .  Multiple Vitamins-Minerals (CENTRUM SILVER 50+WOMEN) TABS, Take 1 tablet by mouth daily., Disp: , Rfl:  .  pantoprazole (PROTONIX) 20 MG tablet, Take 20 mg by mouth daily., Disp: , Rfl:  .  simvastatin (ZOCOR) 40 MG tablet, Take 40 mg by mouth daily., Disp: , Rfl:  .  SYNTHROID 100 MCG tablet, Take 100 mcg by mouth daily before breakfast., Disp: , Rfl:  .  traZODone (DESYREL) 100 MG tablet, Take 1 tablet (100 mg total) by mouth at bedtime., Disp: 90 tablet, Rfl: 1 .  venlafaxine (EFFEXOR) 75 MG tablet, Take 75 mg by mouth 3 (three) times daily with meals., Disp: , Rfl:    Allergies  Allergen Reactions  . Lidocaine Shortness Of Breath    Past Medical History:  Diagnosis Date  . Diabetes mellitus type 2, diet-controlled ([place])   .  HLD (hyperlipidemia)   . Hypothyroidism   . Iron deficiency anemia      Past Surgical History:  Procedure Laterality Date  . arthroscopic knee    . C sections     x 7  . TONSILLECTOMY    . WISDOM TOOTH EXTRACTION    . WRIST SURGERY      Family History  Problem Relation Age of Onset  . Multiple myeloma Mother   . Heart attack Father   . Lung cancer Sister     Social History   Tobacco Use  . Smoking status: Never Smoker  . Smokeless tobacco: Never Used  Substance Use Topics  . Alcohol use: Yes    Comment: Rarely  . Drug use: Never    ROS Denies chest pain, shob, wheezing.   Objective:   Vitals: BP (!) 150/81   Pulse 80   Temp 98 F (36.7 C) (Oral)   Resp 16   SpO2 97%   Physical Exam Constitutional:      General: She is not in acute distress.    Appearance: Normal appearance. She is well-developed. She is not ill-appearing, toxic-appearing or diaphoretic.  HENT:     Head: Normocephalic and atraumatic.     Right Ear: Tympanic membrane and ear canal normal. No drainage or tenderness. No middle ear effusion. Tympanic membrane is not erythematous.     Left Ear: Tympanic  membrane and ear canal normal. No drainage or tenderness.  No middle ear effusion. Tympanic membrane is not erythematous.     Nose: Congestion present. No rhinorrhea.     Mouth/Throat:     Mouth: Mucous membranes are moist. No oral lesions.     Pharynx: No pharyngeal swelling, oropharyngeal exudate, posterior oropharyngeal erythema or uvula swelling.     Tonsils: No tonsillar exudate or tonsillar abscesses.     Comments: Thick streaks of pnd overlying pharynx. Eyes:     General: No scleral icterus.    Extraocular Movements: Extraocular movements intact.     Right eye: Normal extraocular motion.     Left eye: Normal extraocular motion.     Conjunctiva/sclera: Conjunctivae normal.     Pupils: Pupils are equal, round, and reactive to light.  Cardiovascular:     Rate and Rhythm: Normal rate  and regular rhythm.     Pulses: Normal pulses.     Heart sounds: Normal heart sounds. No murmur. No friction rub. No gallop.   Pulmonary:     Effort: Pulmonary effort is normal. No respiratory distress.     Breath sounds: Normal breath sounds. No stridor. No wheezing, rhonchi or rales.  Musculoskeletal:     Cervical back: Normal range of motion and neck supple.  Lymphadenopathy:     Cervical: No cervical adenopathy.  Skin:    General: Skin is warm and dry.     Findings: No rash.  Neurological:     General: No focal deficit present.     Mental Status: She is alert and oriented to person, place, and time.     Cranial Nerves: No cranial nerve deficit.     Motor: No weakness.     Coordination: Coordination abnormal (ambulates with cane).  Psychiatric:        Mood and Affect: Mood normal.        Behavior: Behavior normal.        Thought Content: Thought content normal.        Judgment: Judgment normal.      Assessment and Plan :   1. Acute recurrent sinusitis, unspecified location   2. Allergic rhinitis due to other allergic trigger, unspecified seasonality     Will cover for sinusitis with amoxicillin. Restart Zyrtec. If no improvement in 2-3 days use prednisone course to address underlying allergic rhinitis.  Low risk for COVID-19, patient prefers to defer testing.  Counseled patient on potential for adverse effects with medications prescribed/recommended today, ER and return-to-clinic precautions discussed, patient verbalized understanding.   Dana Nielsen, [place] 02/21/19 1720

## 2019-02-21 NOTE — Discharge Instructions (Signed)
If you have no improvement with amoxicillin in 2-3 days, then please start the low dose prednisone to help augment your treatment.

## 2019-02-26 ENCOUNTER — Encounter (HOSPITAL_COMMUNITY): Payer: Self-pay

## 2019-02-26 ENCOUNTER — Ambulatory Visit (HOSPITAL_COMMUNITY)
Admission: EM | Admit: 2019-02-26 | Discharge: 2019-02-26 | Disposition: A | Discharge status: Home / Self Care | Payer: Medicare HMO | Attending: Family Medicine | Admitting: Family Medicine

## 2019-02-26 ENCOUNTER — Telehealth: Payer: Self-pay

## 2019-02-26 ENCOUNTER — Other Ambulatory Visit: Payer: Self-pay

## 2019-02-26 DIAGNOSIS — Z862 Personal history of diseases of the blood and blood-forming organs and certain disorders involving the immune mechanism: Secondary | ICD-10-CM | POA: Diagnosis not present

## 2019-02-26 LAB — CBC
HCT: 35.7 % — ABNORMAL LOW (ref 36.0–46.0)
Hemoglobin: 11.5 g/dL — ABNORMAL LOW (ref 12.0–15.0)
MCH: 29.4 pg (ref 26.0–34.0)
MCHC: 32.2 g/dL (ref 30.0–36.0)
MCV: 91.3 fL (ref 80.0–100.0)
Platelets: 241 10*3/uL (ref 150–400)
RBC: 3.91 MIL/uL (ref 3.87–5.11)
RDW: 14 % (ref 11.5–15.5)
WBC: 8.1 10*3/uL (ref 4.0–10.5)
nRBC: 0 % (ref 0.0–0.2)

## 2019-02-26 NOTE — Telephone Encounter (Signed)
Received call from patient requesting labs in order to know her Hgb level. Patient reports having a history of blood loss and believes she is currently anemic because of the s/sx she is currently having such as very very dark stool and severe leg cramps. She reports that she takes oral iron pills but this is not listed on her medication list. She insists that her stool is darker than normal. Patient is requesting lab work this week.  Please advise.

## 2019-02-26 NOTE — Discharge Instructions (Addendum)
Call your doctor tomorrow  Results for KIERAH, FRUTOS (MRN [id number]) as of 02/26/2019 18:31  Ref. Range 01/18/2019 14:48 02/26/2019 17:52  WBC Latest Ref Range: 4.0 - 10.5 K/uL 6.2 8.1  RBC Latest Ref Range: 3.87 - 5.11 MIL/uL 4.20 3.91  Hemoglobin Latest Ref Range: 12.0 - 15.0 g/dL 12.5 11.5 (L)  HCT Latest Ref Range: 36.0 - 46.0 % 38.9 35.7 (L)  MCV Latest Ref Range: 80.0 - 100.0 fL 92.6 91.3  MCH Latest Ref Range: 26.0 - 34.0 pg 29.8 29.4  MCHC Latest Ref Range: 30.0 - 36.0 g/dL 32.1 32.2  RDW Latest Ref Range: 11.5 - 15.5 % 13.3 14.0  Platelets Latest Ref Range: 150 - 400 K/uL 193 241  nRBC Latest Ref Range: 0.0 - 0.2 % 0.0 0.0

## 2019-02-26 NOTE — Telephone Encounter (Signed)
TCT patient regarding Lab appointment for this week, called to advise that a scheduler would be calling her for appointment for tomorrow. Patient states that she can't wait that long and is going to an urgent care this afternoon.  Dr. Lorenso Courier made aware. Will follow up with patient on Wednesday 2/17.

## 2019-02-26 NOTE — ED Triage Notes (Signed)
Pt state she needs blood work for her anemia. Pt states she's been dizzy and has dark stools this has been going on sense last Wednesday.

## 2019-02-26 NOTE — ED Provider Notes (Signed)
[facility]    CSN: [id number] Arrival date & time: 02/26/19  1630      History   Chief Complaint Chief Complaint  Patient presents with  . anemic / blood work    HPI Dana Nielsen is a 74 y.o. female.   HPI   Patient states she has a history of iron deficiency anemia.  She states that she is had hemoglobins as low as 5 that required hospitalizations.  She states that colonoscopies, endoscopies, and a capsule study have failed to identify the cause of her bleeding.  She states that it is theorized that she has a vascular malformation. She states that she has had darker stool than usual for the last 5 days.  Not black, not bloody.  She also states that when she woke up this morning she stood up and felt slightly lightheaded.  She associates the symptoms as possibly from recurrence of her anemia.  She is here requesting a blood count. She is well controlled diabetes.  Well-controlled hypothyroidism.  Well-controlled blood pressure.  She is compliant with her medication.  Compliant with her doctors visits.  Past Medical History:  Diagnosis Date  . Diabetes mellitus type 2, diet-controlled ([place])   . HLD (hyperlipidemia)   . Hypothyroidism   . Iron deficiency anemia     Patient Active Problem List   Diagnosis Date Noted  . Primary insomnia 12/14/2018  . Patient is Jehovah's Witness 12/14/2018  . Iron deficiency anemia 12/14/2018  . Hypothyroidism 12/14/2018  . Depression, major, single episode, mild ([place]) 12/14/2018  . Controlled type 2 diabetes mellitus with complication, without long-term current use of insulin ([place]) 12/14/2018  . Essential hypertension 12/14/2018  . Vitamin D deficiency 12/14/2018  . Elevated cholesterol 12/14/2018    Past Surgical History:  Procedure Laterality Date  . arthroscopic knee    . C sections     x 7  . TONSILLECTOMY    . WISDOM TOOTH EXTRACTION    . WRIST SURGERY      OB History   No obstetric history on file.       Home Medications    Prior to Admission medications   Medication Sig Start Date End Date Taking? Authorizing Provider  B Complex Vitamins (B COMPLEX 100 PO) Take 1 tablet by mouth daily.    [provider]  Cholecalciferol (VITAMIN D-3) 25 MCG (1000 UT) CAPS Take 1 capsule by mouth daily.    [provider]  furosemide (LASIX) 20 MG tablet Take 20 mg by mouth daily.    [provider]  liraglutide (VICTOZA) 18 MG/3ML SOPN Inject 1.8 mg into the skin every morning.    [provider]  losartan (COZAAR) 25 MG tablet Take 25 mg by mouth daily.    [provider]  metFORMIN (GLUCOPHAGE) 1000 MG tablet Take 1,000 mg by mouth 2 (two) times daily with a meal.    [provider]  Multiple Vitamins-Minerals (CENTRUM SILVER 50+WOMEN) TABS Take 1 tablet by mouth daily.    [provider]  pantoprazole (PROTONIX) 20 MG tablet Take 20 mg by mouth daily.    [provider]  predniSONE (DELTASONE) 20 MG tablet Take 1 tablet (20 mg total) by mouth daily with breakfast. 02/21/19   Jaynee Eagles, PA-C  simvastatin (ZOCOR) 40 MG tablet Take 40 mg by mouth daily.    [provider]  SYNTHROID 100 MCG tablet Take 100 mcg by mouth daily before breakfast.    [provider]  traZODone (DESYREL) 100 MG tablet Take 1 tablet (100 mg total) by mouth at bedtime. 12/14/18   Libby Maw, MD  venlafaxine (EFFEXOR) 75 MG tablet Take 75 mg by mouth 3 (three) times daily with meals.    [provider]    Family History Family History  Problem Relation Age of Onset  . Multiple myeloma Mother   . Heart attack Father   . Lung cancer Sister     Social History Social History   Tobacco Use  . Smoking status: Never Smoker  . Smokeless tobacco: Never Used  Substance Use Topics  . Alcohol use: Yes    Comment: Rarely  . Drug use: Never     Allergies   Lidocaine   Review of Systems Review of Systems   Gastrointestinal: Positive for constipation.       Dark stools, occasional constipation  Neurological: Positive for dizziness and light-headedness. Negative for headaches.     Physical Exam Triage Vital Signs ED Triage Vitals  Enc Vitals Group     BP 02/26/19 1659 (!) 142/77     Pulse Rate 02/26/19 1659 99     Resp 02/26/19 1659 16     Temp 02/26/19 1659 98.2 F (36.8 C)     Temp Source 02/26/19 1659 Oral     SpO2 --      Weight 02/26/19 1659 195 lb (88.5 kg)     Height --      Head Circumference --      Peak Flow --      Pain Score 02/26/19 1834 0     Pain Loc --      Pain Edu? --      Excl. in [place]? --    No data found.  Updated Vital Signs BP (!) 142/77 (BP Location: Right Arm)   Pulse 99   Temp 98.2 F (36.8 C) (Oral)   Resp 16   Wt 88.5 kg   BMI 35.67 kg/m      Physical Exam Constitutional:      General: She is not in acute distress.    Appearance: She is well-developed.  HENT:     Head: Normocephalic and atraumatic.     Mouth/Throat:     Comments: Mask in place Eyes:     Conjunctiva/sclera: Conjunctivae normal.     Pupils: Pupils are equal, round, and reactive to light.  Cardiovascular:     Rate and Rhythm: Normal rate and regular rhythm.     Heart sounds: Normal heart sounds.  Pulmonary:     Effort: Pulmonary effort is normal. No respiratory distress.  Musculoskeletal:        General: Normal range of motion.     Cervical back: Normal range of motion.  Skin:    General: Skin is warm and dry.     Coloration: Skin is not pale.  Neurological:     Mental Status: She is alert.  Psychiatric:        Behavior: Behavior normal.        UC Treatments / Results  Labs (all labs ordered are listed, but only abnormal results are displayed) Labs Reviewed  CBC - Abnormal; Notable for the following components:      Result Value   Hemoglobin 11.5 (*)    HCT 35.7 (*)    All other components within normal limits    EKG   Radiology No results  found.  Procedures Procedures (including critical care time)  Medications Ordered in UC Medications -  No data to display  Initial Impression / Assessment and Plan / UC Course  I have reviewed the triage vital signs and the nursing notes.  Pertinent labs & imaging results that were available during my care of the patient were reviewed by me and considered in my medical decision making (see chart for details).     Patient's hemoglobin has dropped from 12.5-11.5 in the last month.  I advised her to follow-up with her PCP tomorrow. Final Clinical Impressions(s) / UC Diagnoses   Final diagnoses:  History of anemia     Discharge Instructions      Call your doctor tomorrow  Results for Dana, Nielsen (MRN [id number]) as of 02/26/2019 18:31  Ref. Range 01/18/2019 14:48 02/26/2019 17:52  WBC Latest Ref Range: 4.0 - 10.5 K/uL 6.2 8.1  RBC Latest Ref Range: 3.87 - 5.11 MIL/uL 4.20 3.91  Hemoglobin Latest Ref Range: 12.0 - 15.0 g/dL 12.5 11.5 (L)  HCT Latest Ref Range: 36.0 - 46.0 % 38.9 35.7 (L)  MCV Latest Ref Range: 80.0 - 100.0 fL 92.6 91.3  MCH Latest Ref Range: 26.0 - 34.0 pg 29.8 29.4  MCHC Latest Ref Range: 30.0 - 36.0 g/dL 32.1 32.2  RDW Latest Ref Range: 11.5 - 15.5 % 13.3 14.0  Platelets Latest Ref Range: 150 - 400 K/uL 193 241  nRBC Latest Ref Range: 0.0 - 0.2 % 0.0 0.0     ED Prescriptions    None     PDMP not reviewed this encounter.   Raylene Everts, MD 02/26/19 [phone]

## 2019-02-27 ENCOUNTER — Telehealth: Payer: Self-pay

## 2019-02-27 NOTE — Telephone Encounter (Signed)
TC from Pt stating her HGB results were 11.5 Pt. Stated she felt ok and wanted to know when she should check her HGB again Per Dr. Lorenso Courier left Vm message to Pt to inform her that if she feels like she is having too many dark stools and would like to get her blood work done just give a call and we will schedule a lab appointment for her. But as of right now her next lab  appointment is scheduled for 04/19/19 at 215 p.

## 2019-03-02 ENCOUNTER — Ambulatory Visit: Payer: Medicare HMO | Admitting: Family Medicine

## 2019-03-05 ENCOUNTER — Ambulatory Visit (INDEPENDENT_AMBULATORY_CARE_PROVIDER_SITE_OTHER): Payer: Medicare HMO | Admitting: Family Medicine

## 2019-03-05 ENCOUNTER — Other Ambulatory Visit: Payer: Self-pay

## 2019-03-05 ENCOUNTER — Encounter: Payer: Self-pay | Admitting: Family Medicine

## 2019-03-05 VITALS — BP 122/70 | HR 85 | Temp 96.0°F | Ht 62.0 in | Wt 203.4 lb

## 2019-03-05 DIAGNOSIS — K921 Melena: Secondary | ICD-10-CM | POA: Diagnosis not present

## 2019-03-05 LAB — CBC
HCT: 31.5 % — ABNORMAL LOW (ref 36.0–46.0)
Hemoglobin: 10.2 g/dL — ABNORMAL LOW (ref 12.0–15.0)
MCHC: 32.3 g/dL (ref 30.0–36.0)
MCV: 90.1 fl (ref 78.0–100.0)
Platelets: 237 10*3/uL (ref 150.0–400.0)
RBC: 3.49 Mil/uL — ABNORMAL LOW (ref 3.87–5.11)
RDW: 14.6 % (ref 11.5–15.5)
WBC: 6.1 10*3/uL (ref 4.0–10.5)

## 2019-03-05 NOTE — Addendum Note (Signed)
Addended by: Jon Billings on: 03/05/2019 04:57 PM   Modules accepted: Orders

## 2019-03-05 NOTE — Progress Notes (Addendum)
Established Patient Office Visit  Subjective:  Patient ID: Dana Nielsen, female    DOB: Jan 30, 1945  Age: 74 y.o. MRN: [id number]  CC:  Chief Complaint  Patient presents with  . Acute Visit    c/o of dark stools x 2 weeks.     HPI Dana Nielsen presents for evaluation of recent melena.  Patient denies hematochezia.  Longstanding history of iron deficiency anemia.  Dyspepsia is controlled with Protonix.  Status post extensive GI work-up with multiple colonoscopies camera endoscopy.  She is unable to take iron secondary to its constipating side effect.  She denies lightheadedness or dizziness.  She has scheduled follow-up with hematology next week.  Past Medical History:  Diagnosis Date  . Diabetes mellitus type 2, diet-controlled ([place])   . HLD (hyperlipidemia)   . Hypothyroidism   . Iron deficiency anemia     Past Surgical History:  Procedure Laterality Date  . arthroscopic knee    . C sections     x 7  . TONSILLECTOMY    . WISDOM TOOTH EXTRACTION    . WRIST SURGERY      Family History  Problem Relation Age of Onset  . Multiple myeloma Mother   . Heart attack Father   . Lung cancer Sister     Social History   Socioeconomic History  . Marital status: Widowed    Spouse name: Not on file  . Number of children: 55  . Years of education: Not on file  . Highest education level: Not on file  Occupational History  . Not on file  Tobacco Use  . Smoking status: Never Smoker  . Smokeless tobacco: Never Used  Substance and Sexual Activity  . Alcohol use: Yes    Comment: Rarely  . Drug use: Never  . Sexual activity: Not on file  Other Topics Concern  . Not on file  Social History Narrative  . Not on file   Social Determinants of Health   Financial Resource Strain:   . Difficulty of Paying Living Expenses: Not on file  Food Insecurity:   . Worried About Charity fundraiser in the Last Year: Not on file  . Ran Out of Food in the Last Year: Not on file    Transportation Needs:   . Lack of Transportation (Medical): Not on file  . Lack of Transportation (Non-Medical): Not on file  Physical Activity:   . Days of Exercise per Week: Not on file  . Minutes of Exercise per Session: Not on file  Stress:   . Feeling of Stress : Not on file  Social Connections:   . Frequency of Communication with Friends and Family: Not on file  . Frequency of Social Gatherings with Friends and Family: Not on file  . Attends Religious Services: Not on file  . Active Member of Clubs or Organizations: Not on file  . Attends Archivist Meetings: Not on file  . Marital Status: Not on file  Intimate Partner Violence:   . Fear of Current or Ex-Partner: Not on file  . Emotionally Abused: Not on file  . Physically Abused: Not on file  . Sexually Abused: Not on file    Outpatient Medications Prior to Visit  Medication Sig Dispense Refill  . furosemide (LASIX) 20 MG tablet Take 20 mg by mouth daily.    Marland Kitchen liraglutide (VICTOZA) 18 MG/3ML SOPN Inject 1.8 mg into the skin every morning.    Marland Kitchen losartan (COZAAR) 25 MG tablet Take  25 mg by mouth daily.    . metFORMIN (GLUCOPHAGE) 1000 MG tablet Take 1,000 mg by mouth 2 (two) times daily with a meal.    . pantoprazole (PROTONIX) 20 MG tablet Take 20 mg by mouth daily.    . simvastatin (ZOCOR) 40 MG tablet Take 40 mg by mouth daily.    Marland Kitchen SYNTHROID 100 MCG tablet Take 100 mcg by mouth daily before breakfast.    . traZODone (DESYREL) 100 MG tablet Take 1 tablet (100 mg total) by mouth at bedtime. 90 tablet 1  . venlafaxine (EFFEXOR) 75 MG tablet Take 75 mg by mouth 3 (three) times daily with meals.    . B Complex Vitamins (B COMPLEX 100 PO) Take 1 tablet by mouth daily.    . Cholecalciferol (VITAMIN D-3) 25 MCG (1000 UT) CAPS Take 1 capsule by mouth daily.    . Multiple Vitamins-Minerals (CENTRUM SILVER 50+WOMEN) TABS Take 1 tablet by mouth daily.    . predniSONE (DELTASONE) 20 MG tablet Take 1 tablet (20 mg total) by  mouth daily with breakfast. (Patient not taking: Reported on 03/05/2019) 7 tablet 0   No facility-administered medications prior to visit.    Allergies  Allergen Reactions  . Lidocaine Shortness Of Breath    ROS Review of Systems  Constitutional: Negative.   HENT: Negative.   Eyes: Negative for photophobia and visual disturbance.  Respiratory: Negative.   Cardiovascular: Negative.   Gastrointestinal: Positive for constipation. Negative for abdominal pain, anal bleeding, blood in stool, nausea and vomiting.  Endocrine: Negative for polyphagia and polyuria.  Genitourinary: Negative.  Negative for hematuria.  Allergic/Immunologic: Negative for immunocompromised state.  Neurological: Negative for dizziness, light-headedness and numbness.      Objective:    Physical Exam  Constitutional: She is oriented to person, place, and time. She appears well-developed and well-nourished. No distress.  HENT:  Head: Normocephalic and atraumatic.  Right Ear: External ear normal.  Left Ear: External ear normal.  Eyes: Conjunctivae are normal. Right eye exhibits no discharge. Left eye exhibits no discharge. No scleral icterus.  Neck: No JVD present. No tracheal deviation present.  Cardiovascular: Normal rate, regular rhythm and normal heart sounds.  Pulmonary/Chest: Effort normal and breath sounds normal. No stridor.  Genitourinary: Rectum:     Guaiac result positive.     No rectal mass, anal fissure, tenderness or abnormal anal tone.   Neurological: She is alert and oriented to person, place, and time.  Skin: Skin is warm and dry. She is not diaphoretic.  Psychiatric: She has a normal mood and affect. Her behavior is normal.    BP 122/70   Pulse 85   Temp (!) 96 F (35.6 C) (Tympanic)   Ht '5\' 2"'  (1.575 m)   Wt 203 lb 6.4 oz (92.3 kg)   SpO2 99%   BMI 37.20 kg/m  Wt Readings from Last 3 Encounters:  03/05/19 203 lb 6.4 oz (92.3 kg)  02/26/19 195 lb (88.5 kg)  01/18/19 202 lb 9.6  oz (91.9 kg)     Health Maintenance Due  Topic Date Due  . Hepatitis C Screening  Apr 13, 1945  . FOOT EXAM  01/13/1955  . OPHTHALMOLOGY EXAM  01/13/1955  . TETANUS/TDAP  01/13/1964  . MAMMOGRAM  01/13/1995  . COLONOSCOPY  01/13/1995  . DEXA SCAN  01/12/2010  . PNA vac Low Risk Adult (1 of 2 - PCV13) 01/12/2010    There are no preventive care reminders to display for this patient.  Lab Results  Component Value Date   TSH 3.59 12/15/2018   Lab Results  Component Value Date   WBC 6.1 03/05/2019   HGB 10.2 (L) 03/05/2019   HCT 31.5 (L) 03/05/2019   MCV 90.1 03/05/2019   PLT 237.0 03/05/2019   Lab Results  Component Value Date   NA 141 12/15/2018   K 4.0 12/15/2018   CO2 25 12/15/2018   GLUCOSE 117 (H) 12/15/2018   BUN 21 12/15/2018   CREATININE 0.84 12/15/2018   BILITOT 0.5 12/15/2018   ALKPHOS 50 12/15/2018   AST 20 12/15/2018   ALT 29 12/15/2018   PROT 6.4 12/15/2018   ALBUMIN 4.3 12/15/2018   CALCIUM 9.3 12/15/2018   GFR 66.29 12/15/2018   Lab Results  Component Value Date   CHOL 127 12/15/2018   Lab Results  Component Value Date   HDL 35.90 (L) 12/15/2018   Lab Results  Component Value Date   LDLCALC 69 12/15/2018   Lab Results  Component Value Date   TRIG 110.0 12/15/2018   Lab Results  Component Value Date   CHOLHDL 4 12/15/2018   Lab Results  Component Value Date   HGBA1C 6.3 12/15/2018      Assessment & Plan:   Problem List Items Addressed This Visit      Digestive   Melena - Primary   Relevant Orders   CBC (Completed)   Iron, TIBC and Ferritin Panel   Ambulatory referral to Hematology      No orders of the defined types were placed in this encounter.   Follow-up: Return Plan for Follow up with hematology next week.Libby Maw, MD

## 2019-03-05 NOTE — Patient Instructions (Signed)

## 2019-03-06 ENCOUNTER — Telehealth: Payer: Self-pay | Admitting: Hematology and Oncology

## 2019-03-06 ENCOUNTER — Encounter (HOSPITAL_COMMUNITY): Payer: Self-pay | Admitting: *Deleted

## 2019-03-06 ENCOUNTER — Telehealth: Payer: Self-pay | Admitting: Family Medicine

## 2019-03-06 ENCOUNTER — Inpatient Hospital Stay (HOSPITAL_COMMUNITY)
Admission: EM | Admit: 2019-03-06 | Discharge: 2019-03-09 | DRG: 377 | Disposition: A | Discharge status: Home / Self Care | Payer: Medicare HMO | Attending: Internal Medicine | Admitting: Internal Medicine

## 2019-03-06 DIAGNOSIS — K824 Cholesterolosis of gallbladder: Secondary | ICD-10-CM | POA: Diagnosis not present

## 2019-03-06 DIAGNOSIS — Z79899 Other long term (current) drug therapy: Secondary | ICD-10-CM

## 2019-03-06 DIAGNOSIS — D62 Acute posthemorrhagic anemia: Secondary | ICD-10-CM | POA: Diagnosis present

## 2019-03-06 DIAGNOSIS — D500 Iron deficiency anemia secondary to blood loss (chronic): Secondary | ICD-10-CM | POA: Diagnosis not present

## 2019-03-06 DIAGNOSIS — I10 Essential (primary) hypertension: Secondary | ICD-10-CM | POA: Diagnosis present

## 2019-03-06 DIAGNOSIS — K922 Gastrointestinal hemorrhage, unspecified: Secondary | ICD-10-CM | POA: Diagnosis not present

## 2019-03-06 DIAGNOSIS — K449 Diaphragmatic hernia without obstruction or gangrene: Secondary | ICD-10-CM

## 2019-03-06 DIAGNOSIS — Z789 Other specified health status: Secondary | ICD-10-CM | POA: Diagnosis not present

## 2019-03-06 DIAGNOSIS — Z20822 Contact with and (suspected) exposure to covid-19: Secondary | ICD-10-CM | POA: Diagnosis present

## 2019-03-06 DIAGNOSIS — Z8249 Family history of ischemic heart disease and other diseases of the circulatory system: Secondary | ICD-10-CM | POA: Diagnosis not present

## 2019-03-06 DIAGNOSIS — Z807 Family history of other malignant neoplasms of lymphoid, hematopoietic and related tissues: Secondary | ICD-10-CM | POA: Diagnosis not present

## 2019-03-06 DIAGNOSIS — K2990 Gastroduodenitis, unspecified, without bleeding: Secondary | ICD-10-CM

## 2019-03-06 DIAGNOSIS — Z884 Allergy status to anesthetic agent status: Secondary | ICD-10-CM

## 2019-03-06 DIAGNOSIS — K3189 Other diseases of stomach and duodenum: Secondary | ICD-10-CM | POA: Diagnosis not present

## 2019-03-06 DIAGNOSIS — K259 Gastric ulcer, unspecified as acute or chronic, without hemorrhage or perforation: Secondary | ICD-10-CM | POA: Diagnosis not present

## 2019-03-06 DIAGNOSIS — K921 Melena: Secondary | ICD-10-CM

## 2019-03-06 DIAGNOSIS — F329 Major depressive disorder, single episode, unspecified: Secondary | ICD-10-CM | POA: Diagnosis present

## 2019-03-06 DIAGNOSIS — K254 Chronic or unspecified gastric ulcer with hemorrhage: Secondary | ICD-10-CM | POA: Diagnosis present

## 2019-03-06 DIAGNOSIS — R160 Hepatomegaly, not elsewhere classified: Secondary | ICD-10-CM

## 2019-03-06 DIAGNOSIS — E118 Type 2 diabetes mellitus with unspecified complications: Secondary | ICD-10-CM | POA: Diagnosis present

## 2019-03-06 DIAGNOSIS — IMO0001 Reserved for inherently not codable concepts without codable children: Secondary | ICD-10-CM | POA: Diagnosis present

## 2019-03-06 DIAGNOSIS — K3182 Dieulafoy lesion (hemorrhagic) of stomach and duodenum: Secondary | ICD-10-CM | POA: Diagnosis present

## 2019-03-06 DIAGNOSIS — E039 Hypothyroidism, unspecified: Secondary | ICD-10-CM | POA: Diagnosis present

## 2019-03-06 DIAGNOSIS — K2970 Gastritis, unspecified, without bleeding: Secondary | ICD-10-CM | POA: Diagnosis not present

## 2019-03-06 DIAGNOSIS — K219 Gastro-esophageal reflux disease without esophagitis: Secondary | ICD-10-CM | POA: Diagnosis present

## 2019-03-06 DIAGNOSIS — Z801 Family history of malignant neoplasm of trachea, bronchus and lung: Secondary | ICD-10-CM

## 2019-03-06 DIAGNOSIS — Z7984 Long term (current) use of oral hypoglycemic drugs: Secondary | ICD-10-CM | POA: Diagnosis not present

## 2019-03-06 DIAGNOSIS — K2991 Gastroduodenitis, unspecified, with bleeding: Secondary | ICD-10-CM | POA: Diagnosis present

## 2019-03-06 DIAGNOSIS — E785 Hyperlipidemia, unspecified: Secondary | ICD-10-CM | POA: Diagnosis present

## 2019-03-06 DIAGNOSIS — Z531 Procedure and treatment not carried out because of patient's decision for reasons of belief and group pressure: Secondary | ICD-10-CM | POA: Diagnosis present

## 2019-03-06 DIAGNOSIS — D649 Anemia, unspecified: Secondary | ICD-10-CM | POA: Diagnosis present

## 2019-03-06 DIAGNOSIS — D509 Iron deficiency anemia, unspecified: Secondary | ICD-10-CM | POA: Diagnosis not present

## 2019-03-06 DIAGNOSIS — Z79890 Hormone replacement therapy (postmenopausal): Secondary | ICD-10-CM

## 2019-03-06 LAB — IRON,TIBC AND FERRITIN PANEL
%SAT: 14 % (calc) — ABNORMAL LOW (ref 16–45)
Ferritin: 236 ng/mL (ref 16–288)
Iron: 50 ug/dL (ref 45–160)
TIBC: 348 mcg/dL (calc) (ref 250–450)

## 2019-03-06 NOTE — Telephone Encounter (Signed)
Received a call from Dana Nielsen reporting she has had a drop in her Hgb over the last month. She initially called our clinic for a Hgb check on 02/26/2019 and we offered her a check the next day. She went to Urgent care and had it drawn that evening instead, which showed Hgb 11.5 (down from 12.5 on 01/18/2019). We recommended continuing to follow her Hgb at that time.  On 03/05/2019 the patient went to her PCP and had Hgb drawn again. At that time found to have Hgb 10.2. She endorses having dark stools with no overt red blood in the stool.   Given this rapid drop of 1.3 grams and her status as a Jehovah's Witness (NO BLOOD PRODUCTS, re-emphasized today) I have recommend she come into the ED to be admitted for GI evaluation, monitoring of the Hgb, and consideration of alternative methods to raise the Hgb (EPO + Iron).   Recommendations: --please admit to general medicine service --please consult GI for consideration of EGD vs colonoscopy --please order CBC, CMP, Iron panel/ferritin and Erythropoietin level --Our service will consider EPO, IV Iron, and tranexamic acid once we have the opportunity to evaluate her.   Please do not hesitate to call or page me at [phone] with questions or concerns regarding the care of the patient.   Ledell Peoples, MD Department of Hematology/Oncology [place] [facility] Phone: [phone] Pager: [phone] Email: [email or web address]

## 2019-03-06 NOTE — ED Provider Notes (Signed)
[facility] Emergency Department Provider Note MRN:  [id number]  Arrival date & time: 03/07/19     Chief Complaint   Low Hgb 10.2   History of Present Illness   Dana Nielsen is a 74 y.o. year-old female with a history of diabetes, GI bleeding presenting to the ED with chief complaint of low hemoglobin.  Patient explains that her hemoglobin has been downtrending for the past 2 weeks.  Has noted dark black stool.  Sent here by her hematologist for admission.  Denies abdominal pain, no fever, no chest pain, shortness of breath, no lightheadedness.  Patient is a Restaurant manager, fast food.  Review of Systems  A complete 10 system review of systems was obtained and all systems are negative except as noted in the HPI and PMH.   Patient's Health History    Past Medical History:  Diagnosis Date  . Diabetes mellitus type 2, diet-controlled ([place])   . HLD (hyperlipidemia)   . Hypothyroidism   . Iron deficiency anemia     Past Surgical History:  Procedure Laterality Date  . arthroscopic knee    . C sections     x 7  . TONSILLECTOMY    . WISDOM TOOTH EXTRACTION    . WRIST SURGERY      Family History  Problem Relation Age of Onset  . Multiple myeloma Mother   . Heart attack Father   . Lung cancer Sister     Social History   Socioeconomic History  . Marital status: Widowed    Spouse name: Not on file  . Number of children: 37  . Years of education: Not on file  . Highest education level: Not on file  Occupational History  . Not on file  Tobacco Use  . Smoking status: Never Smoker  . Smokeless tobacco: Never Used  Substance and Sexual Activity  . Alcohol use: Yes    Comment: Rarely  . Drug use: Never  . Sexual activity: Not on file  Other Topics Concern  . Not on file  Social History Narrative  . Not on file   Social Determinants of Health   Financial Resource Strain:   . Difficulty of Paying Living Expenses: Not on file  Food Insecurity:   .  Worried About Charity fundraiser in the Last Year: Not on file  . Ran Out of Food in the Last Year: Not on file  Transportation Needs:   . Lack of Transportation (Medical): Not on file  . Lack of Transportation (Non-Medical): Not on file  Physical Activity:   . Days of Exercise per Week: Not on file  . Minutes of Exercise per Session: Not on file  Stress:   . Feeling of Stress : Not on file  Social Connections:   . Frequency of Communication with Friends and Family: Not on file  . Frequency of Social Gatherings with Friends and Family: Not on file  . Attends Religious Services: Not on file  . Active Member of Clubs or Organizations: Not on file  . Attends Archivist Meetings: Not on file  . Marital Status: Not on file  Intimate Partner Violence:   . Fear of Current or Ex-Partner: Not on file  . Emotionally Abused: Not on file  . Physically Abused: Not on file  . Sexually Abused: Not on file     Physical Exam   Vitals:   03/06/19 1818  BP: (!) 151/78  Pulse: 93  Resp: 20  Temp: 98.4 F (  36.9 C)  SpO2: 99%    CONSTITUTIONAL: Well-appearing, NAD NEURO:  Alert and oriented x 3, no focal deficits EYES:  eyes equal and reactive ENT/NECK:  no LAD, no JVD CARDIO: Regular rate, well-perfused, normal S1 and S2 PULM:  CTAB no wheezing or rhonchi GI/GU:  normal bowel sounds, non-distended, non-tender MSK/SPINE:  No gross deformities, no edema SKIN:  no rash, atraumatic PSYCH:  Appropriate speech and behavior  *Additional and/or pertinent findings included in MDM below  Diagnostic and Interventional Summary    EKG Interpretation  Date/Time:    Ventricular Rate:    PR Interval:    QRS Duration:   QT Interval:    QTC Calculation:   R Axis:     Text Interpretation:        Cardiac Monitoring Interpretation:  Labs Reviewed  CBC  PROTIME-INR  APTT  COMPREHENSIVE METABOLIC PANEL  VITAMIN X10  FOLATE  RETICULOCYTES  TYPE AND SCREEN    No orders to  display    Medications - No data to display   Procedures  /  Critical Care Procedures  ED Course and Medical Decision Making  I have reviewed the triage vital signs, the nursing notes, and pertinent available records from the EMR.  Pertinent labs & imaging results that were available during my care of the patient were reviewed by me and considered in my medical decision making (see below for details).     Melena, Hemoccult test at Dr. Libby Maw office was positive, hemoglobin downtrending over the past 2 weeks.  To be admitted to hospitalist service for GI evaluation.    Barth Kirks. Sedonia Small, MD [place] [email or web address]  Final Clinical Impressions(s) / ED Diagnoses     ICD-10-CM   1. Melena  K92.1     ED Discharge Orders    None       Discharge Instructions Discussed with and Provided to Patient:   Discharge Instructions   None       Maudie Flakes, MD 03/07/19 [phone]

## 2019-03-06 NOTE — Telephone Encounter (Addendum)
Patient is calling and wanted to see if her blood test was back. Also patient is requesting that an urgent referral be sent to see an hematologist in [place]. Fax number is [phone] CB is [phone]

## 2019-03-06 NOTE — Telephone Encounter (Signed)
Spoke with patient who was ordinally calling to see if she could be referred to hematology in [place]. While on the phone with patient she informed me that she had just gotten a message from her son and was told she would be seen at [facility] for Hgb decreasing.

## 2019-03-06 NOTE — ED Triage Notes (Signed)
Pt states her PCP sent her due to Hgb 10.2 "down 2 pts in a week

## 2019-03-07 ENCOUNTER — Encounter (HOSPITAL_COMMUNITY): Payer: Self-pay | Admitting: Family Medicine

## 2019-03-07 ENCOUNTER — Other Ambulatory Visit: Payer: Self-pay

## 2019-03-07 DIAGNOSIS — Z789 Other specified health status: Secondary | ICD-10-CM

## 2019-03-07 DIAGNOSIS — I10 Essential (primary) hypertension: Secondary | ICD-10-CM

## 2019-03-07 DIAGNOSIS — D649 Anemia, unspecified: Secondary | ICD-10-CM | POA: Diagnosis present

## 2019-03-07 DIAGNOSIS — K922 Gastrointestinal hemorrhage, unspecified: Secondary | ICD-10-CM | POA: Diagnosis present

## 2019-03-07 DIAGNOSIS — E118 Type 2 diabetes mellitus with unspecified complications: Secondary | ICD-10-CM

## 2019-03-07 DIAGNOSIS — D500 Iron deficiency anemia secondary to blood loss (chronic): Secondary | ICD-10-CM | POA: Diagnosis present

## 2019-03-07 LAB — COMPREHENSIVE METABOLIC PANEL
ALT: 22 U/L (ref 0–44)
AST: 19 U/L (ref 15–41)
Albumin: 4.3 g/dL (ref 3.5–5.0)
Alkaline Phosphatase: 53 U/L (ref 38–126)
Anion gap: 8 (ref 5–15)
BUN: 20 mg/dL (ref 8–23)
CO2: 27 mmol/L (ref 22–32)
Calcium: 9.6 mg/dL (ref 8.9–10.3)
Chloride: 106 mmol/L (ref 98–111)
Creatinine, Ser: 0.82 mg/dL (ref 0.44–1.00)
GFR calc Af Amer: >60 mL/min (ref >60)
GFR calc non Af Amer: >60 mL/min (ref >60)
Glucose, Bld: 141 mg/dL — ABNORMAL HIGH (ref 70–99)
Potassium: 3.7 mmol/L (ref 3.5–5.1)
Sodium: 141 mmol/L (ref 135–145)
Total Bilirubin: 0.3 mg/dL (ref 0.3–1.2)
Total Protein: 7.3 g/dL (ref 6.5–8.1)

## 2019-03-07 LAB — GLUCOSE, CAPILLARY
Glucose-Capillary: 142 mg/dL — ABNORMAL HIGH (ref 70–99)
Glucose-Capillary: 102 mg/dL — ABNORMAL HIGH (ref 70–99)
Glucose-Capillary: 106 mg/dL — ABNORMAL HIGH (ref 70–99)
Glucose-Capillary: 103 mg/dL — ABNORMAL HIGH (ref 70–99)
Glucose-Capillary: 137 mg/dL — ABNORMAL HIGH (ref 70–99)
Glucose-Capillary: 106 mg/dL — ABNORMAL HIGH (ref 70–99)

## 2019-03-07 LAB — TYPE AND SCREEN
ABO/RH(D): A POS
Antibody Screen: NEGATIVE

## 2019-03-07 LAB — RETICULOCYTES
Immature Retic Fract: 23.6 % — ABNORMAL HIGH (ref 2.3–15.9)
RBC.: 3.44 MIL/uL — ABNORMAL LOW (ref 3.87–5.11)
Retic Count, Absolute: 108.4 10*3/uL (ref 19.0–186.0)
Retic Ct Pct: 3.2 % — ABNORMAL HIGH (ref 0.4–3.1)

## 2019-03-07 LAB — HEMOGLOBIN AND HEMATOCRIT, BLOOD
HCT: 30.6 % — ABNORMAL LOW (ref 36.0–46.0)
HCT: 34.9 % — ABNORMAL LOW (ref 36.0–46.0)
Hemoglobin: 9.6 g/dL — ABNORMAL LOW (ref 12.0–15.0)
Hemoglobin: 10.6 g/dL — ABNORMAL LOW (ref 12.0–15.0)

## 2019-03-07 LAB — CBC
HCT: 32.1 % — ABNORMAL LOW (ref 36.0–46.0)
Hemoglobin: 10 g/dL — ABNORMAL LOW (ref 12.0–15.0)
MCH: 29.4 pg (ref 26.0–34.0)
MCHC: 31.2 g/dL (ref 30.0–36.0)
MCV: 94.4 fL (ref 80.0–100.0)
Platelets: 244 10*3/uL (ref 150–400)
RBC: 3.4 MIL/uL — ABNORMAL LOW (ref 3.87–5.11)
RDW: 14.5 % (ref 11.5–15.5)
WBC: 6.9 10*3/uL (ref 4.0–10.5)
nRBC: 0 % (ref 0.0–0.2)

## 2019-03-07 LAB — PROTIME-INR
INR: 1 (ref 0.8–1.2)
Prothrombin Time: 12.6 seconds (ref 11.4–15.2)

## 2019-03-07 LAB — ABO/RH: ABO/RH(D): A POS

## 2019-03-07 LAB — APTT: aPTT: 27 seconds (ref 24–36)

## 2019-03-07 LAB — SARS CORONAVIRUS 2 (TAT 6-24 HRS): SARS Coronavirus 2: NEGATIVE

## 2019-03-07 LAB — FOLATE: Folate: 13.2 ng/mL (ref >5.9)

## 2019-03-07 LAB — NO BLOOD PRODUCTS

## 2019-03-07 LAB — VITAMIN B12: Vitamin B-12: 450 pg/mL (ref 180–914)

## 2019-03-07 MED ORDER — SODIUM CHLORIDE 0.9 % IV SOLN
250.0000 mL | INTRAVENOUS | Status: DC | PRN
  Administered 2019-03-07: 250 mL via INTRAVENOUS

## 2019-03-07 MED ORDER — PANTOPRAZOLE SODIUM 40 MG IV SOLR
40.0000 mg | INTRAVENOUS | Status: DC
  Administered 2019-03-07 – 2019-03-08 (×2): 40 mg via INTRAVENOUS
  Filled 2019-03-07 (×2): qty 40

## 2019-03-07 MED ORDER — INSULIN ASPART 100 UNIT/ML ~~LOC~~ SOLN
0.0000 [IU] | SUBCUTANEOUS | Status: DC
  Administered 2019-03-08 (×4): 1 [IU] via SUBCUTANEOUS
  Filled 2019-03-07: qty 0.09

## 2019-03-07 MED ORDER — SODIUM CHLORIDE 0.9% FLUSH: 3.0000 mL | INTRAVENOUS | Status: DC | PRN

## 2019-03-07 MED ORDER — SODIUM CHLORIDE 0.9 % IV SOLN
510.0000 mg | Freq: Once | INTRAVENOUS | Status: AC
  Administered 2019-03-07: 510 mg via INTRAVENOUS
  Filled 2019-03-07: qty 510

## 2019-03-07 MED ORDER — SODIUM CHLORIDE 0.9% FLUSH
3.0000 mL | Freq: Two times a day (BID) | INTRAVENOUS | Status: DC
  Administered 2019-03-07 – 2019-03-09 (×5): 3 mL via INTRAVENOUS

## 2019-03-07 MED ORDER — TRAZODONE HCL 50 MG PO TABS
100.0000 mg | ORAL_TABLET | Freq: Once | ORAL | Status: AC
  Administered 2019-03-07: 100 mg via ORAL
  Filled 2019-03-07: qty 2

## 2019-03-07 NOTE — Progress Notes (Signed)
PROGRESS NOTE    [name]  GEX:[id number] DOB: July 15, 1945 DOA: 03/06/2019 PCP: Libby Maw, MD    Brief Narrative:  Dana Nielsen is a 74 year old Caucasian female [place] who presented to the ED with fatigue and dark/black appearing stools over the past 2 weeks.  She reports a sensation of warmth/burning sensation in her abdomen localized in epigastric region following meals.  Previous history of GI bleeding, underwent extensive GI evaluation in [facility] without any significant findings.  Patient denies any regular use of aspirin, NSAIDs; and not on anticoagulations or antiplatelets.  Was previously followed by hematology, has had prior iron infusions; last infusion reported August 2019.  Assessment & Plan:   Principal Problem:   Blood loss anemia Active Problems:   Patient is Jehovah's Witness   Controlled type 2 diabetes mellitus with complication, without long-term current use of insulin (HCC)   Essential hypertension   Anemia Approximately 1.5 g drop hemoglobin as outpatient.  Unclear etiology, high suspicion for AVMs.  Complicated by patient's history of being a Jehovah's Witness.  Reports dark tarry stools over the past 2 weeks.  Denies NSAID, antiplatelet, anticoagulant use. --[facility] following, appreciate assistance --Hgb 10.0 (11.5 two weeks ago) --N.p.o. after midnight --Plan for EGD and small bowel enteroscopy on 03/08/2019  Essential hypertension BP 129/69, controlled.  On furosemide 20 mg p.o. daily, losartan 25 mg p.o. daily at home. --Hold home antihypertensives for now  Type 2 diabetes mellitus Hemoglobin A1c 6.3% December 2020.  On Metformin at home and Victoza --Hold oral hypoglycemics --Insulin sliding scale for coverage --CBGs QAC/HS  Hypothyroidism --Continue Synthroid 100 mcg p.o. daily  HLD: Hold statin, will continue on discharge  Depression: Continue venlafaxine 75 mg p.o. daily  GERD: Continue  PPI   DVT prophylaxis: SCDs Code Status: Full code Family Communication: Discussed with patient at bedside Disposition Plan:      Patient is from: Home     Anticipated Disposition:  Home     Barriers to discharge or conditions that needs to be met prior to discharge: Completion of EGD and capsule endoscopy, signing off from gastroenterology  Consultants:   [facility]  Procedures:   None  Antimicrobials:   None   Subjective: Patient seen and examined at bedside this morning, in good spirits.  Does not denies any further bowel movement since hospitalization.  No specific complaints this morning.  Discussed with GI at bedside, plan for EGD with capsule endoscopy tomorrow.  Patient denies headache, no fever/chills/night sweats, no nausea cefonicid/diarrhea, no chest pain, no palpitations, no shortness of breath, no current abdominal pain.  No acute events overnight per nursing staff.  Objective: Vitals:   03/06/19 1816 03/06/19 1818 03/07/19 0200 03/07/19 0420  BP:  (!) 151/78 135/69 129/69  Pulse:  93 89 86  Resp:  _0 Temp:  98.4 F (36.9 C)  97.8 F (36.6 C)  TempSrc:  Oral  Oral  SpO2:  99% 99% 98%  Weight: 90.7 kg     Height: _1  (1.575 m)      No intake or output data in the 24 hours ending 03/07/19 1405 Filed Weights   03/06/19 1816  Weight: 90.7 kg    Examination:  General exam: Appears calm and comfortable  Respiratory system: Clear to auscultation. Respiratory effort normal.  Oxygenating well on room air. Cardiovascular system: S1 & S2 heard, RRR. No JVD, murmurs, rubs, gallops or clicks. No pedal edema. Gastrointestinal system: Abdomen is nondistended, soft  and nontender. No organomegaly or masses felt. Normal bowel sounds heard. Central nervous system: Alert and oriented. No focal neurological deficits. Extremities: Symmetric 5 x 5 power. Skin: No rashes, lesions or ulcers Psychiatry: Judgement and insight appear normal. Mood & affect  appropriate.     Data Reviewed: I have personally reviewed following labs and imaging studies  CBC: Recent Labs  Lab 03/05/19 1330 03/06/19 2354 03/07/19 1137  WBC 6.1 6.9  --   HGB 10.2* 10.0* 9.6*  HCT 31.5* 32.1* 30.6*  MCV 90.1 94.4  --   PLT 237.0 244  --    Basic Metabolic Panel: Recent Labs  Lab 03/06/19 2354  NA 141  K 3.7  CL 106  CO2 27  GLUCOSE 141*  BUN 20  CREATININE 0.82  CALCIUM 9.6   GFR: Estimated Creatinine Clearance: 63 mL/min (by C-G formula based on SCr of 0.82 mg/dL). Liver Function Tests: Recent Labs  Lab 03/06/19 2354  AST 19  ALT 22  ALKPHOS 53  BILITOT 0.3  PROT 7.3  ALBUMIN 4.3   No results for input(s): LIPASE, AMYLASE in the last 168 hours. No results for input(s): AMMONIA in the last 168 hours. Coagulation Profile: Recent Labs  Lab 03/06/19 2354  INR 1.0   Cardiac Enzymes: No results for input(s): CKTOTAL, CKMB, CKMBINDEX, TROPONINI in the last 168 hours. BNP (last 3 results) No results for input(s): PROBNP in the last 8760 hours. HbA1C: No results for input(s): HGBA1C in the last 72 hours. CBG: Recent Labs  Lab 03/07/19 0425 03/07/19 0714 03/07/19 1208  GLUCAP 142* 102* 106*   Lipid Profile: No results for input(s): CHOL, HDL, LDLCALC, TRIG, CHOLHDL, LDLDIRECT in the last 72 hours. Thyroid Function Tests: No results for input(s): TSH, T4TOTAL, FREET4, T3FREE, THYROIDAB in the last 72 hours. Anemia Panel: Recent Labs    03/05/19 1330 03/06/19 2354  VITAMINB12  --  450  FOLATE  --  13.2  FERRITIN 236  --   TIBC 348  --   IRON 50  --   RETICCTPCT  --  3.2*   Sepsis Labs: No results for input(s): PROCALCITON, LATICACIDVEN in the last 168 hours.  Recent Results (from the past 240 hour(s))  SARS CORONAVIRUS 2 (TAT 6-24 HRS) Nasopharyngeal Nasopharyngeal Swab     Status: None   Collection Time: 03/07/19  2:15 AM   Specimen: Nasopharyngeal Swab  Result Value Ref Range Status   SARS Coronavirus 2 NEGATIVE  NEGATIVE Final    Comment: (NOTE) SARS-CoV-2 target nucleic acids are NOT DETECTED. The SARS-CoV-2 RNA is generally detectable in upper and lower respiratory specimens during the acute phase of infection. Negative results do not preclude SARS-CoV-2 infection, do not rule out co-infections with other pathogens, and should not be used as the sole basis for treatment or other patient management decisions. Negative results must be combined with clinical observations, patient history, and epidemiological information. The expected result is Negative. Fact Sheet for Patients: SugarRoll.be Fact Sheet for Healthcare Providers: https://www.woods-mathews.com/ This test is not yet approved or cleared by the [place] FDA and  has been authorized for detection and/or diagnosis of SARS-CoV-2 by FDA under an Emergency Use Authorization (EUA). This EUA will remain  in effect (meaning this test can be used) for the duration of the COVID-19 declaration under Section 56 4(b)(1) of the Act, 21 U.S.C. section 360bbb-3(b)(1), unless the authorization is terminated or revoked sooner. Performed at [facility], [place] [address], [place], [place] [address]  Radiology Studies: No results found.      Scheduled Meds: . insulin aspart  0-9 Units Subcutaneous Q4H  . pantoprazole (PROTONIX) IV  40 mg Intravenous Q24H  . sodium chloride flush  3 mL Intravenous Q12H   Continuous Infusions: . sodium chloride       LOS: 0 days    Time spent: 34 minutes spent on chart review, discussion with nursing staff, consultants, updating family and interview/physical exam; more than 50% of that time was spent in counseling and/or coordination of care.    Burnett Lieber J [place] DO [facility] Available via Epic secure chat 7am-7pm After these hours, please refer to coverage provider listed on [email or web address] 03/07/2019, 2:05 PM

## 2019-03-07 NOTE — ED Provider Notes (Signed)
Plan at signout to f/u on labs, admit Pt is a Raymond Gurney witness, refuses blood products D/w dr Hilarie Fredrickson for GI who will see in hospital   Ripley Fraise, MD 03/07/19 [phone]

## 2019-03-07 NOTE — Consult Note (Signed)
[facility] [phone]   Fax:([phone] [place] NOTE  Patient Care Team: Libby Maw, MD as PCP - General (Family Medicine)  Hematological/Oncological History #Iron Deficiency Anemia 1) 2019: presented to [facility] in [facility] with symptoms of anemia. Found to have Hgb 5.0. EGD/colon/capsule reported found no source of bleed 2) 01/18/2019: establish care with Dr. Lorenso Courier   CHIEF COMPLAINTS/PURPOSE OF CONSULTATION:  "Melenic stools and Hgb drop "  HISTORY OF PRESENTING ILLNESS:  Dana Nielsen 74 y.o. female with medical history significant for iron deficiency anemia 2/2 to a GI bleed of unclear etiology who recently established with our clinic.  When she initially established with our clinic on 01/18/2019 the patient was found to have white blood cell count of 6.2, hemoglobin of 12.5, and a platelet count of 193.  Her MCV was normal at the time at 92.6 and iron studies previously drawn on 12/15/2018 showed a an iron saturation of 25%, ferritin of 368, total iron binding best of 306, and a serum iron of 78.  At that time we agreed upon lab draws every 3 months to continue to monitor her hemoglobin.    On 02/26/2019 the patient noted that she was having dark tarry stools and became concerned.  She requested a lab draw in our clinic which we offered to schedule the next day on 02/27/2019 however she was deeply concerned and had them drawn locally at an urgent care.  Hemoglobin at that time was 11.5.  We recommended continued monitoring.  The patient subsequently saw her primary care provider 1 week later and had a hemoglobin drawn of 10.2.  Due to concern for this drop in hemoglobin she called our clinic requesting further recommendations.  Given the rapid drop of 12.5 down to 10.2 in a 6-week period of time we did recommend inpatient admission for expedited GI evaluation and administration of IV iron with further work-up.  Of note the patient is  Jehovah's Witness and declines any and all blood products.  As such I had her admitted out of an abundance of caution so that we could work-up the GI bleed, monitor hemoglobin levels, and administer IV iron as quickly as possible.  On exam today Dana Nielsen notes that she feels well.  She notes that she is having some burning sensation in her abdomen that has been associated with her dark tarry stools.  She notes that they have also been maroon in color but that she has not seen any frank red blood in her stool.  She also notes that she has not had bleeding from other sources including nosebleeds, GYN bleeding, or bruising.  She notes that she has been eating an otherwise healthy diet consisting of meats and vegetables with no restrictions.  She currently denies any frank abdominal pain or episodes of nausea or vomiting.  A full 10 point ROS is listed below.  MEDICAL HISTORY:  Past Medical History:  Diagnosis Date  . Diabetes mellitus type 2, diet-controlled ([place])   . HLD (hyperlipidemia)   . Hypothyroidism   . Iron deficiency anemia     SURGICAL HISTORY: Past Surgical History:  Procedure Laterality Date  . arthroscopic knee    . C sections     x 7  . TONSILLECTOMY    . WISDOM TOOTH EXTRACTION    . WRIST SURGERY      SOCIAL HISTORY: Social History   Socioeconomic History  . Marital status: Widowed    Spouse name: Not  on file  . Number of children: 37  . Years of education: Not on file  . Highest education level: Not on file  Occupational History  . Not on file  Tobacco Use  . Smoking status: Never Smoker  . Smokeless tobacco: Never Used  Substance and Sexual Activity  . Alcohol use: Yes    Comment: Rarely  . Drug use: Never  . Sexual activity: Not on file  Other Topics Concern  . Not on file  Social History Narrative  . Not on file   Social Determinants of Health   Financial Resource Strain:   . Difficulty of Paying Living Expenses: Not on file  Food Insecurity:     . Worried About Charity fundraiser in the Last Year: Not on file  . Ran Out of Food in the Last Year: Not on file  Transportation Needs:   . Lack of Transportation (Medical): Not on file  . Lack of Transportation (Non-Medical): Not on file  Physical Activity:   . Days of Exercise per Week: Not on file  . Minutes of Exercise per Session: Not on file  Stress:   . Feeling of Stress : Not on file  Social Connections:   . Frequency of Communication with Friends and Family: Not on file  . Frequency of Social Gatherings with Friends and Family: Not on file  . Attends Religious Services: Not on file  . Active Member of Clubs or Organizations: Not on file  . Attends Archivist Meetings: Not on file  . Marital Status: Not on file  Intimate Partner Violence:   . Fear of Current or Ex-Partner: Not on file  . Emotionally Abused: Not on file  . Physically Abused: Not on file  . Sexually Abused: Not on file    FAMILY HISTORY: Family History  Problem Relation Age of Onset  . Multiple myeloma Mother   . Heart attack Father   . Lung cancer Sister     ALLERGIES:  is allergic to lidocaine.  MEDICATIONS:  Current Facility-Administered Medications  Medication Dose Route Frequency Provider Last Rate Last Admin  . 0.9 %  sodium chloride infusion  250 mL Intravenous PRN Opyd, Ilene Qua, MD      . ferumoxytol (FERAHEME) 510 mg in sodium chloride 0.9 % 100 mL IVPB  510 mg Intravenous Once Narda Rutherford T IV, MD      . insulin aspart (novoLOG) injection 0-9 Units  0-9 Units Subcutaneous Q4H Opyd, Ilene Qua, MD      . pantoprazole (PROTONIX) injection 40 mg  40 mg Intravenous Q24H Esterwood, Amy S, PA-C   40 mg at 03/07/19 1329  . sodium chloride flush (NS) 0.9 % injection 3 mL  3 mL Intravenous Q12H Opyd, Ilene Qua, MD   3 mL at 03/07/19 1044  . sodium chloride flush (NS) 0.9 % injection 3 mL  3 mL Intravenous PRN Opyd, Ilene Qua, MD        REVIEW OF SYSTEMS:   Constitutional: ( - )  fevers, ( - )  chills , ( - ) night sweats Eyes: ( - ) blurriness of vision, ( - ) double vision, ( - ) watery eyes Ears, nose, mouth, throat, and face: ( - ) mucositis, ( - ) sore throat Respiratory: ( - ) cough, ( - ) dyspnea, ( - ) wheezes Cardiovascular: ( - ) palpitation, ( - ) chest discomfort, ( - ) lower extremity swelling Gastrointestinal:  ( - ) nausea, ( - )  heartburn, ( - ) change in bowel habits Skin: ( - ) abnormal skin rashes Lymphatics: ( - ) new lymphadenopathy, ( - ) easy bruising Neurological: ( - ) numbness, ( - ) tingling, ( - ) new weaknesses Behavioral/Psych: ( - ) mood change, ( - ) new changes  All other systems were reviewed with the patient and are negative.  PHYSICAL EXAMINATION: ECOG PERFORMANCE STATUS: 1 - Symptomatic but completely ambulatory  Vitals:   03/07/19 0200 03/07/19 0420  BP: 135/69 129/69  Pulse: 89 86  Resp: 13 18  Temp:  97.8 F (36.6 C)  SpO2: 99% 98%   Filed Weights   03/06/19 1816  Weight: 200 lb (90.7 kg)    GENERAL: well appearing elderly Caucasian female in NAD  SKIN: skin color, texture, turgor are normal, no rashes or significant lesions EYES: conjunctiva are pink and non-injected, sclera clear LUNGS: clear to auscultation and percussion with normal breathing effort HEART: regular rate & rhythm and no murmurs and no lower extremity edema ABDOMEN: soft, non-tender, non-distended, normal bowel sounds Musculoskeletal: no cyanosis of digits and no clubbing  PSYCH: alert & oriented x 3, fluent speech NEURO: no focal motor/sensory deficits  LABORATORY DATA:  I have reviewed the data as listed CBC Latest Ref Rng & Units 03/07/2019 03/06/2019 03/05/2019  WBC 4.0 - 10.5 K/uL - 6.9 6.1  Hemoglobin 12.0 - 15.0 g/dL 9.6(L) 10.0(L) 10.2(L)  Hematocrit 36.0 - 46.0 % 30.6(L) 32.1(L) 31.5(L)  Platelets 150 - 400 K/uL - 244 237.0    CMP Latest Ref Rng & Units 03/06/2019 12/15/2018  Glucose 70 - 99 mg/dL 141(H) 117(H)  BUN 8 - 23 mg/dL 20  21  Creatinine 0.44 - 1.00 mg/dL 0.82 0.84  Sodium 135 - 145 mmol/L 141 141  Potassium 3.5 - 5.1 mmol/L 3.7 4.0  Chloride 98 - 111 mmol/L 106 106  CO2 22 - 32 mmol/L 27 25  Calcium 8.9 - 10.3 mg/dL 9.6 9.3  Total Protein 6.5 - 8.1 g/dL 7.3 6.4  Total Bilirubin 0.3 - 1.2 mg/dL 0.3 0.5  Alkaline Phos 38 - 126 U/L 53 50  AST 15 - 41 U/L 19 20  ALT 0 - 44 U/L 22 29   PATHOLOGY: None relevant to review   RADIOGRAPHIC STUDIES: None relevant to review.  No results found.  ASSESSMENT & PLAN Dana Nielsen 74 y.o. female with medical history significant for iron deficiency anemia 2/2 to a GI bleed of unclear etiology who recently established with our clinic.  After evaluation of the labs discussion with the patient her findings are most consistent with an iron deficiency anemia secondary to a GI bleed.  Given the steady pace a decline in her hemoglobin I recommended that she be admitted for expedited evaluation to attempt to identify the GI bleed source.  Additionally I am concerned that we are limited in our treatment options given her status as a Jehovah's Witness.  Therefore admitting her for close monitoring of the hemoglobin, GI evaluation, and IV iron therapy seemed an appropriate strategy moving forward.  We appreciate the assistance of GI and working at the GI bleed.  Our role in this is to provide her with adequate resources for hematopoiesis to assure that she is restoring blood faster than it being lost.  We will initially attempt IV iron therapy to raise her blood levels.  Our hope is that she will have a robust response to the iron and replete her hemoglobin faster and is being depleted.  In  the event that this is not effective we can consider the addition of EPO therapy.  Also in the event that GI is unable to identify a source we could consider therapy with tranexamic acid as a means of stalling and slowing mucocutaneous bleeding.  As of now the patient scheduled for EGD tomorrow and  we are hopeful that this will reveal and control the source of bleeding.  #Iron Deficiency Anemia from Likely GI Bleed #Blood Management in Jehovah's Witness --appreciate the prompt evaluation by Gastroenterology to evaluate for a source of bleeding.  --will replete iron with a dose of Feraheme 561m IV x 1 dose. Can administer a second dose in the outpatient setting once she is discharged --no clear indication for EPO therapy at this time. Will attempt to increase levels with IV iron alone at this time. If this is not successful we can consider EPO therapy -- if no source of GI bleeding can be identified and levels continue to drop we will consider starting tranexamic acid for suspected mucocutaneous bleed.  --please be judicious in ordering bloodwork to spare unnecessary blood drawing. --Hematology will continue to follow.    All questions were answered. The patient knows to call the clinic with any problems, questions or concerns.  A total of more than 55 minutes were spent on this encounter and over half of that time was spent on counseling and coordination of care as outlined above.   JLedell Peoples MD Department of Hematology/Oncology [facility]on[facility] CenterPhone: [phone]: [phone]: [email or web address]  03/07/2019 6:07 PM

## 2019-03-07 NOTE — H&P (View-Only) (Signed)
Consultation  Referring Provider: [facility] hospital[facility]/Dr. [place]  primary Care Physician:  Libby Maw, MD Primary Gastroenterologist: None/unassigned.  Reason for Consultation: Melena and anemia  HPI: Dana Nielsen is a 74 y.o. female, who was admitted last evening through the emergency room, after she was found to have had a drop in her hemoglobin over the past 2 weeks from 11.5 down to 10.2. Patient says she has been noticing dark black appearing stools over the past couple of weeks and the last episode was 2 days ago. She has prior history of obscure GI bleeding and has undergone extensive prior GI evaluation in [place].  She just relocated to the [place] area a few months ago. 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  She says her last iron infusion was in August 2019. She believes she last underwent GI evaluation in 2019 as well.  Prior to that she had had work-up the year previous.  By patient report, it is believed that she has AVMs though his ones unable to be proven on her endoscopic studies or capsule endoscopy. She does not take any regular aspirin or NSAIDs.  She says she had been very active recently and wonders if she did too much physically and somehow that stirred up bleeding.  He is not on any blood thinners. She has no complaints of abdominal pain, nausea vomiting heartburn, changes in bowel habits etc. Her weight has been stable over the past couple of years.  Interestingly she had noticed sort of a warm or burning sensation in her abdomen over the past couple of days.  Hemoglobin yesterday 10, pending today Serum iron 50/TIBC 348/iron sat 14   Past Medical History:  Diagnosis Date  . Diabetes mellitus type 2, diet-controlled ([place])   . HLD (hyperlipidemia)   .  Hypothyroidism   . Iron deficiency anemia     Past Surgical History:  Procedure Laterality Date  . arthroscopic knee    . C sections     x 7  . TONSILLECTOMY    . WISDOM TOOTH EXTRACTION    . WRIST SURGERY      Prior to Admission medications   Medication Sig Start Date End Date Taking? Authorizing Provider  furosemide (LASIX) 20 MG tablet Take 20 mg by mouth daily.   Yes [provider]  liraglutide (VICTOZA) 18 MG/3ML SOPN Inject 1.8 mg into the skin every morning.   Yes [provider]  losartan (COZAAR) 25 MG tablet Take 25 mg by mouth daily.   Yes [provider]  metFORMIN (GLUCOPHAGE) 1000 MG tablet Take 1,000 mg by mouth 2 (two) times daily with a meal.   Yes [provider]  pantoprazole (PROTONIX) 20 MG tablet Take 20 mg by mouth daily.   Yes [provider]  simvastatin (ZOCOR) 40 MG tablet Take 40 mg by mouth daily.   Yes [provider]  SYNTHROID 100 MCG tablet Take 100 mcg by mouth daily before breakfast.   Yes [provider]  traZODone (DESYREL) 100 MG tablet Take 1 tablet (100 mg total) by mouth at bedtime. 12/14/18  Yes Libby Maw, MD  venlafaxine [facility]) 75 MG tablet Take 75 mg by mouth daily.    Yes [provider]  predniSONE (DELTASONE) 20 MG tablet Take 1 tablet (20 mg total) by  mouth daily with breakfast. Patient not taking: Reported on 03/05/2019 02/21/19   Jaynee Eagles, PA-C    Current Facility-Administered Medications  Medication Dose Route Frequency Provider Last Rate Last Admin  . 0.9 %  sodium chloride infusion  250 mL Intravenous PRN Opyd, Ilene Qua, MD      . insulin aspart (novoLOG) injection 0-9 Units  0-9 Units Subcutaneous Q4H Opyd, Ilene Qua, MD      . pantoprazole (PROTONIX) injection 40 mg  40 mg Intravenous Q24H Esterwood, Amy S, PA-C      . sodium chloride flush (NS) 0.9 % injection 3 mL  3 mL Intravenous Q12H Opyd, Ilene Qua, MD   3 mL at 03/07/19 1044  .  sodium chloride flush (NS) 0.9 % injection 3 mL  3 mL Intravenous PRN Opyd, Ilene Qua, MD        Allergies as of 03/06/2019 - Review Complete 03/06/2019  Allergen Reaction Noted  . Lidocaine Shortness Of Breath 12/14/2018    Family History  Problem Relation Age of Onset  . Multiple myeloma Mother   . Heart attack Father   . Lung cancer Sister     Social History   Socioeconomic History  . Marital status: Widowed    Spouse name: Not on file  . Number of children: 67  . Years of education: Not on file  . Highest education level: Not on file  Occupational History  . Not on file  Tobacco Use  . Smoking status: Never Smoker  . Smokeless tobacco: Never Used  Substance and Sexual Activity  . Alcohol use: Yes    Comment: Rarely  . Drug use: Never  . Sexual activity: Not on file  Other Topics Concern  . Not on file  Social History Narrative  . Not on file   Social Determinants of Health   Financial Resource Strain:   . Difficulty of Paying Living Expenses: Not on file  Food Insecurity:   . Worried About Charity fundraiser in the Last Year: Not on file  . Ran Out of Food in the Last Year: Not on file  Transportation Needs:   . Lack of Transportation (Medical): Not on file  . Lack of Transportation (Non-Medical): Not on file  Physical Activity:   . Days of Exercise per Week: Not on file  . Minutes of Exercise per Session: Not on file  Stress:   . Feeling of Stress : Not on file  Social Connections:   . Frequency of Communication with Friends and Family: Not on file  . Frequency of Social Gatherings with Friends and Family: Not on file  . Attends Religious Services: Not on file  . Active Member of Clubs or Organizations: Not on file  . Attends Archivist Meetings: Not on file  . Marital Status: Not on file  Intimate Partner Violence:   . Fear of Current or Ex-Partner: Not on file  . Emotionally Abused: Not on file  . Physically Abused: Not on file  .  Sexually Abused: Not on file    Review of Systems: Pertinent positive and negative review of systems were noted in the above HPI section.  All other review of systems was otherwise negative.  Physical Exam: Vital signs in last 24 hours: Temp:  [97.8 F (36.6 C)-98.4 F (36.9 C)] 97.8 F (36.6 C) (02/24 0420) Pulse Rate:  [86-93] 86 (02/24 0420) Resp:  [13-20] 18 (02/24 0420) BP: (129-151)/(69-78) 129/69 (02/24 0420) SpO2:  [98 %-99 %] 98 % (  02/24 0420) Weight:  [90.7 kg] 90.7 kg (02/23 1816) Last BM Date: 03/06/19 General:   Alert,  Well-developed, well-nourished, elderly white female pleasant and cooperative in NAD Head:  Normocephalic and atraumatic. Eyes:  Sclera clear, no icterus.   Conjunctiva pink. Ears:  Normal auditory acuity. Nose:  No deformity, discharge,  or lesions. Mouth:  No deformity or lesions.   Neck:  Supple; no masses or thyromegaly. Lungs:  Clear throughout to auscultation.   No wheezes, crackles, or rhonchi. Heart:  Regular rate and rhythm; no murmurs, clicks, rubs,  or gallops. Abdomen:  Soft, obese, nontender nondistended, liver is enlarged, BS active,nonpalp mass or hsm.   Rectal:  Deferred  Msk:  Symmetrical without gross deformities. . Pulses:  Normal pulses noted. Extremities:  Without clubbing or edema. Neurologic:  Alert and  oriented x4;  grossly normal neurologically. Skin:  Intact without significant lesions or rashes.. Psych:  Alert and cooperative. Normal mood and affect.  Intake/Output from previous day: No intake/output data recorded. Intake/Output this shift: No intake/output data recorded.  Lab Results: Recent Labs    03/05/19 1330 03/06/19 2354  WBC 6.1 6.9  HGB 10.2* 10.0*  HCT 31.5* 32.1*  PLT 237.0 244   BMET Recent Labs    03/06/19 2354  NA 141  K 3.7  CL 106  CO2 27  GLUCOSE 141*  BUN 20  CREATININE 0.82  CALCIUM 9.6   LFT Recent Labs    03/06/19 2354  PROT 7.3  ALBUMIN 4.3  AST 19  ALT 22  ALKPHOS 53    BILITOT 0.3   PT/INR Recent Labs    03/06/19 2354  LABPROT 12.6  INR 1.0   Hepatitis Panel No results for input(s): HEPBSAG, HCVAB, HEPAIGM, HEPBIGM in the last 72 hours.        IMPRESSION:  #59 74 year old white female with previous history of obscure GI bleeding and prior GI work-up in 2019 in [facility] which by patient report was unremarkable including EGD colonoscopy and capsule endoscopy. Patient developed recurrent melena over the past 2 weeks and was noted to have a drop in her hemoglobin of about 1.5 g as an outpatient.  Etiology of current blood loss not clear, suspect AVMs.  Patient is a Sales promotion account executive Witness  #2  Adult onset diabetes mellitus 3.  Hypertension 4.  Hypothyroidism  PLAN: Regular diet today, n.p.o. after midnight IV Protonix Serial hemoglobins Patient will be scheduled for EGD and small bowel enteroscopy with Dr. Bryan Lemma for tomorrow morning.  Procedure was discussed in detail with patient including indications risks and benefits and she is agreeable to proceed. If enteroscopy is unrevealing, will proceed with capsule endoscopy. Thank you we will follow with you    Amy Esterwood PA-C 03/07/2019, 11:21 AM     Attending physician's note   I have taken an interval history, reviewed the chart and examined the patient. I agree with the Advanced Practitioner's note, impression and recommendations.   74yrold WF Jehovah witness Adm d/t melena (Hb 12.5 to 10 in 1 month). No further bleeding.  HD stable H/O Obscure GI bleed with IDA thought to be d/t SB AVMs (neg EGD, colon x 2 2018/2019, neg CE, [facility] Did require IV iron previously Fatty liver. No obvious cirrhosis. Nl plts/PT/LFTs  Plan: -IV Protonix -Push enteroscopy in a.m with Dr CBryan Lemma Explained risks and benefits. -If neg, repeat capsule endoscopy (can be done as an outpatient) -[place] -[place]-  Avoid  nonsteroidals.    Carmell Austria, MD Velora Heckler Fabienne Bruns [phone].

## 2019-03-07 NOTE — H&P (Signed)
History and Physical    Arlicia Paquette XKG:[id number] DOB: [birth year] DOA: 03/06/2019  PCP: Libby Maw, MD   Patient coming from: Home   Chief Complaint: Dark maroon stools, low Hgb   HPI: Dana Nielsen is a 74 y.o. female with medical history significant for hypertension, depression, type 2 diabetes mellitus, hypothyroidism, and history of GI bleeding, now presenting to the emergency department at the direction of her hematologist for evaluation of worsening anemia.  Patient is a Restaurant manager, fast food and has noted dark maroon stool for the past 2 weeks, has had her blood counts monitored in the outpatient setting, and there has been a drop from 11.5 two weeks ago down to 10.2 yesterday.  She denies any abdominal pain, does not take aspirin or any NSAID.  She takes a daily Protonix.  She describes her bowel movements as dark maroon sludge for the past 2 weeks.  She reports having similar symptoms in 2019, was hospitalized in [place] at that time, reports hemoglobin as low as 5, and states that she had upper and lower endoscopy as well as capsule study with no source identified.  Patient has not experienced any chest pain, shortness of breath, or lightheadedness with the current episode.  Her hematologist recommended that she present to the ED for admission and further evaluation and management of this as an inpatient.  ED Course: Upon arrival to the ED, patient is found to be afebrile, saturating well on room air, and with normal heart rate and stable blood pressure.  Chemistry panel is unremarkable and CBC notable for hemoglobin of 10.0.  Gastroenterology was consulted by the ED physician, Covid PCR screening test has not yet resulted, anemia panel and erythropoietin levels have been ordered, and hospitalist consulted for admission.  Review of Systems:  All other systems reviewed and apart from HPI, are negative.  Past Medical History:  Diagnosis Date  . Diabetes mellitus type  2, diet-controlled ([place])   . HLD (hyperlipidemia)   . Hypothyroidism   . Iron deficiency anemia     Past Surgical History:  Procedure Laterality Date  . arthroscopic knee    . C sections     x 7  . TONSILLECTOMY    . WISDOM TOOTH EXTRACTION    . WRIST SURGERY       reports that she has never smoked. She has never used smokeless tobacco. She reports current alcohol use. She reports that she does not use drugs.  Allergies  Allergen Reactions  . Lidocaine Shortness Of Breath    Family History  Problem Relation Age of Onset  . Multiple myeloma Mother   . Heart attack Father   . Lung cancer Sister      Prior to Admission medications   Medication Sig Start Date End Date Taking? Authorizing Provider  furosemide (LASIX) 20 MG tablet Take 20 mg by mouth daily.   Yes [provider]  liraglutide (VICTOZA) 18 MG/3ML SOPN Inject 1.8 mg into the skin every morning.   Yes [provider]  losartan (COZAAR) 25 MG tablet Take 25 mg by mouth daily.   Yes [provider]  metFORMIN (GLUCOPHAGE) 1000 MG tablet Take 1,000 mg by mouth 2 (two) times daily with a meal.   Yes [provider]  pantoprazole (PROTONIX) 20 MG tablet Take 20 mg by mouth daily.   Yes [provider]  simvastatin (ZOCOR) 40 MG tablet Take 40 mg by mouth daily.   Yes [provider]  SYNTHROID 100 MCG  tablet Take 100 mcg by mouth daily before breakfast.   Yes [provider]  traZODone (DESYREL) 100 MG tablet Take 1 tablet (100 mg total) by mouth at bedtime. 12/14/18  Yes Libby Maw, MD  venlafaxine [facility]) 75 MG tablet Take 75 mg by mouth daily.    Yes [provider]  predniSONE (DELTASONE) 20 MG tablet Take 1 tablet (20 mg total) by mouth daily with breakfast. Patient not taking: Reported on 03/05/2019 02/21/19   Jaynee Eagles, PA-C    Physical Exam: Vitals:   03/06/19 1816 03/06/19 1818 03/07/19 0200  BP:  (!) 151/78 135/69  Pulse:   93 89  Resp:  20 13  Temp:  98.4 F (36.9 C)   TempSrc:  Oral   SpO2:  99% 99%  Weight: 90.7 kg    Height: '5\' 2"'  (1.575 m)       Constitutional: NAD, calm  Eyes: PERTLA, lids and conjunctivae normal ENMT: Mucous membranes are moist. Posterior pharynx clear of any exudate or lesions.   Neck: normal, supple, no masses, no thyromegaly Respiratory: clear to auscultation bilaterally, no wheezing, no crackles. No accessory muscle use.  Cardiovascular: S1 & S2 heard, regular rate and rhythm. No extremity edema. No significant JVD. Abdomen: No distension, no tenderness, soft. Bowel sounds active.  Musculoskeletal: no clubbing / cyanosis. No joint deformity upper and lower extremities.   Skin: no significant rashes, lesions, ulcers. Warm, dry, well-perfused. Neurologic: No facial asymmetry. Sensation intact. Moving all extremities.  Psychiatric: Alert and oriented x 3. Pleasant and cooperative.    Labs and Imaging on Admission: I have personally reviewed following labs and imaging studies  CBC: Recent Labs  Lab 03/05/19 1330 03/06/19 2354  WBC 6.1 6.9  HGB 10.2* 10.0*  HCT 31.5* 32.1*  MCV 90.1 94.4  PLT 237.0 503   Basic Metabolic Panel: Recent Labs  Lab 03/06/19 2354  NA 141  K 3.7  CL 106  CO2 27  GLUCOSE 141*  BUN 20  CREATININE 0.82  CALCIUM 9.6   GFR: Estimated Creatinine Clearance: 63 mL/min (by C-G formula based on SCr of 0.82 mg/dL). Liver Function Tests: Recent Labs  Lab 03/06/19 2354  AST 19  ALT 22  ALKPHOS 53  BILITOT 0.3  PROT 7.3  ALBUMIN 4.3   No results for input(s): LIPASE, AMYLASE in the last 168 hours. No results for input(s): AMMONIA in the last 168 hours. Coagulation Profile: Recent Labs  Lab 03/06/19 2354  INR 1.0   Cardiac Enzymes: No results for input(s): CKTOTAL, CKMB, CKMBINDEX, TROPONINI in the last 168 hours. BNP (last 3 results) No results for input(s): PROBNP in the last 8760 hours. HbA1C: No results for input(s):  HGBA1C in the last 72 hours. CBG: No results for input(s): GLUCAP in the last 168 hours. Lipid Profile: No results for input(s): CHOL, HDL, LDLCALC, TRIG, CHOLHDL, LDLDIRECT in the last 72 hours. Thyroid Function Tests: No results for input(s): TSH, T4TOTAL, FREET4, T3FREE, THYROIDAB in the last 72 hours. Anemia Panel: Recent Labs    03/05/19 1330 03/06/19 2354  VITAMINB12  --  450  FOLATE  --  13.2  FERRITIN 236  --   TIBC 348  --   IRON 50  --   RETICCTPCT  --  3.2*   Urine analysis:    Component Value Date/Time   COLORURINE YELLOW 12/15/2018 1058   APPEARANCEUR CLEAR 12/15/2018 1058   LABSPEC 1.015 12/15/2018 1058   PHURINE 6.0 12/15/2018 1058   GLUCOSEU NEGATIVE 12/15/2018  [place] 12/15/2018 [place] 12/15/2018 [place] 12/15/2018 1058   UROBILINOGEN 0.2 12/15/2018 1058   NITRITE NEGATIVE 12/15/2018 1058   LEUKOCYTESUR TRACE (A) 12/15/2018 1058   Sepsis Labs: '@LABRCNTIP' (procalcitonin:4,lacticidven:4) )No results found for this or any previous visit (from the past 240 hour(s)).   Radiological Exams on Admission: No results found.  Assessment/Plan   1. Blood-loss anemia; GI bleeding  - Presents with 2 weeks of BMs described as "dark maroon sludge" without abdominal pain, and has had Hgb drop from 11.5 to 10.0 over this interval  - She reports similar episode in 2019 and states that EGD, colonoscopy, and capsule study were performed with no source of bleeding identified  - She is asymptomatic on admission  - GI consulted by ED physician and hematology following per notes  - Anemia panel and erythropoietin levels pending, will keep NPO for possible endoscopy    2. Hypertension  - BP at goal    3. Type II DM  - A1c was 6.3% in December 2020  - Managed with metformin at home  - She is npo for possible endoscopy, will monitor CBGs and use a low-intensity SSI with Novolog if needed     DVT prophylaxis: SCDs    Code Status: Full  Family Communication: Discussed with patient  Disposition Plan: Will likely return home after GI and heme-onc consultations if bleeding source identified and counts remain stable.  Consults called: GI consulted by ED physician  Admission status: Observation     Vianne Bulls, MD [facility] Pager: See [email or web address]  If 7AM-7PM, please contact the daytime attending [email or web address]  03/07/2019, 2:33 AM

## 2019-03-07 NOTE — Consult Note (Addendum)
Consultation  Referring Provider: [facility] hospitalist/Dr. [place]  primary Care Physician:  Libby Maw, MD Primary Gastroenterologist: None/unassigned.  Reason for Consultation: Melena and anemia  HPI: Dana Nielsen is a 74 y.o. female, who was admitted last evening through the emergency room, after she was found to have had a drop in her hemoglobin over the past 2 weeks from 11.5 down to 10.2. Patient says she has been noticing dark black appearing stools over the past couple of weeks and the last episode was 2 days ago. She has prior history of obscure GI bleeding and has undergone extensive prior GI evaluation in [place].  She just relocated to the [place] area a few months ago. 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  She says her last iron infusion was in August 2019. She believes she last underwent GI evaluation in 2019 as well.  Prior to that she had had work-up the year previous.  By patient report, it is believed that she has AVMs though his ones unable to be proven on her endoscopic studies or capsule endoscopy. She does not take any regular aspirin or NSAIDs.  She says she had been very active recently and wonders if she did too much physically and somehow that stirred up bleeding.  He is not on any blood thinners. She has no complaints of abdominal pain, nausea vomiting heartburn, changes in bowel habits etc. Her weight has been stable over the past couple of years.  Interestingly she had noticed sort of a warm or burning sensation in her abdomen over the past couple of days.  Hemoglobin yesterday 10, pending today Serum iron 50/TIBC 348/iron sat 14   Past Medical History:  Diagnosis Date  . Diabetes mellitus type 2, diet-controlled (B and E)   . HLD (hyperlipidemia)   .  Hypothyroidism   . Iron deficiency anemia     Past Surgical History:  Procedure Laterality Date  . arthroscopic knee    . C sections     x 7  . TONSILLECTOMY    . WISDOM TOOTH EXTRACTION    . WRIST SURGERY      Prior to Admission medications   Medication Sig Start Date End Date Taking? Authorizing Provider  furosemide (LASIX) 20 MG tablet Take 20 mg by mouth daily.   Yes [provider]  liraglutide (VICTOZA) 18 MG/3ML SOPN Inject 1.8 mg into the skin every morning.   Yes [provider]  losartan (COZAAR) 25 MG tablet Take 25 mg by mouth daily.   Yes [provider]  metFORMIN (GLUCOPHAGE) 1000 MG tablet Take 1,000 mg by mouth 2 (two) times daily with a meal.   Yes [provider]  pantoprazole (PROTONIX) 20 MG tablet Take 20 mg by mouth daily.   Yes [provider]  simvastatin (ZOCOR) 40 MG tablet Take 40 mg by mouth daily.   Yes [provider]  SYNTHROID 100 MCG tablet Take 100 mcg by mouth daily before breakfast.   Yes [provider]  traZODone (DESYREL) 100 MG tablet Take 1 tablet (100 mg total) by mouth at bedtime. 12/14/18  Yes Libby Maw, MD  venlafaxine [facility]) 75 MG tablet Take 75 mg by mouth daily.    Yes [provider]  predniSONE (DELTASONE) 20 MG tablet Take 1 tablet (20 mg total) by  mouth daily with breakfast. Patient not taking: Reported on 03/05/2019 02/21/19   Jaynee Eagles, PA-C    Current Facility-Administered Medications  Medication Dose Route Frequency Provider Last Rate Last Admin  . 0.9 %  sodium chloride infusion  250 mL Intravenous PRN Opyd, Ilene Qua, MD      . insulin aspart (novoLOG) injection 0-9 Units  0-9 Units Subcutaneous Q4H Opyd, Ilene Qua, MD      . pantoprazole (PROTONIX) injection 40 mg  40 mg Intravenous Q24H Esterwood, Amy S, PA-C      . sodium chloride flush (NS) 0.9 % injection 3 mL  3 mL Intravenous Q12H Opyd, Ilene Qua, MD   3 mL at 03/07/19 1044  .  sodium chloride flush (NS) 0.9 % injection 3 mL  3 mL Intravenous PRN Opyd, Ilene Qua, MD        Allergies as of 03/06/2019 - Review Complete 03/06/2019  Allergen Reaction Noted  . Lidocaine Shortness Of Breath 12/14/2018    Family History  Problem Relation Age of Onset  . Multiple myeloma Mother   . Heart attack Father   . Lung cancer Sister     Social History   Socioeconomic History  . Marital status: Widowed    Spouse name: Not on file  . Number of children: 70  . Years of education: Not on file  . Highest education level: Not on file  Occupational History  . Not on file  Tobacco Use  . Smoking status: Never Smoker  . Smokeless tobacco: Never Used  Substance and Sexual Activity  . Alcohol use: Yes    Comment: Rarely  . Drug use: Never  . Sexual activity: Not on file  Other Topics Concern  . Not on file  Social History Narrative  . Not on file   Social Determinants of Health   Financial Resource Strain:   . Difficulty of Paying Living Expenses: Not on file  Food Insecurity:   . Worried About Charity fundraiser in the Last Year: Not on file  . Ran Out of Food in the Last Year: Not on file  Transportation Needs:   . Lack of Transportation (Medical): Not on file  . Lack of Transportation (Non-Medical): Not on file  Physical Activity:   . Days of Exercise per Week: Not on file  . Minutes of Exercise per Session: Not on file  Stress:   . Feeling of Stress : Not on file  Social Connections:   . Frequency of Communication with Friends and Family: Not on file  . Frequency of Social Gatherings with Friends and Family: Not on file  . Attends Religious Services: Not on file  . Active Member of Clubs or Organizations: Not on file  . Attends Archivist Meetings: Not on file  . Marital Status: Not on file  Intimate Partner Violence:   . Fear of Current or Ex-Partner: Not on file  . Emotionally Abused: Not on file  . Physically Abused: Not on file  .  Sexually Abused: Not on file    Review of Systems: Pertinent positive and negative review of systems were noted in the above HPI section.  All other review of systems was otherwise negative.  Physical Exam: Vital signs in last 24 hours: Temp:  [97.8 F (36.6 C)-98.4 F (36.9 C)] 97.8 F (36.6 C) (02/24 0420) Pulse Rate:  [86-93] 86 (02/24 0420) Resp:  [13-20] 18 (02/24 0420) BP: (129-151)/(69-78) 129/69 (02/24 0420) SpO2:  [98 %-99 %] 98 % (  02/24 0420) Weight:  [90.7 kg] 90.7 kg (02/23 1816) Last BM Date: 03/06/19 General:   Alert,  Well-developed, well-nourished, elderly white female pleasant and cooperative in NAD Head:  Normocephalic and atraumatic. Eyes:  Sclera clear, no icterus.   Conjunctiva pink. Ears:  Normal auditory acuity. Nose:  No deformity, discharge,  or lesions. Mouth:  No deformity or lesions.   Neck:  Supple; no masses or thyromegaly. Lungs:  Clear throughout to auscultation.   No wheezes, crackles, or rhonchi. Heart:  Regular rate and rhythm; no murmurs, clicks, rubs,  or gallops. Abdomen:  Soft, obese, nontender nondistended, liver is enlarged, BS active,nonpalp mass or hsm.   Rectal:  Deferred  Msk:  Symmetrical without gross deformities. . Pulses:  Normal pulses noted. Extremities:  Without clubbing or edema. Neurologic:  Alert and  oriented x4;  grossly normal neurologically. Skin:  Intact without significant lesions or rashes.. Psych:  Alert and cooperative. Normal mood and affect.  Intake/Output from previous day: No intake/output data recorded. Intake/Output this shift: No intake/output data recorded.  Lab Results: Recent Labs    03/05/19 1330 03/06/19 2354  WBC 6.1 6.9  HGB 10.2* 10.0*  HCT 31.5* 32.1*  PLT 237.0 244   BMET Recent Labs    03/06/19 2354  NA 141  K 3.7  CL 106  CO2 27  GLUCOSE 141*  BUN 20  CREATININE 0.82  CALCIUM 9.6   LFT Recent Labs    03/06/19 2354  PROT 7.3  ALBUMIN 4.3  AST 19  ALT 22  ALKPHOS 53   BILITOT 0.3   PT/INR Recent Labs    03/06/19 2354  LABPROT 12.6  INR 1.0   Hepatitis Panel No results for input(s): HEPBSAG, HCVAB, HEPAIGM, HEPBIGM in the last 72 hours.        IMPRESSION:  #43 74 year old white female with previous history of obscure GI bleeding and prior GI work-up in 2019 in [facility] which by patient report was unremarkable including EGD colonoscopy and capsule endoscopy. Patient developed recurrent melena over the past 2 weeks and was noted to have a drop in her hemoglobin of about 1.5 g as an outpatient.  Etiology of current blood loss not clear, suspect AVMs.  Patient is a Sales promotion account executive Witness  #2  Adult onset diabetes mellitus 3.  Hypertension 4.  Hypothyroidism  PLAN: Regular diet today, n.p.o. after midnight IV Protonix Serial hemoglobins Patient will be scheduled for EGD and small bowel enteroscopy with Dr. Bryan Lemma for tomorrow morning.  Procedure was discussed in detail with patient including indications risks and benefits and she is agreeable to proceed. If enteroscopy is unrevealing, will proceed with capsule endoscopy. Thank you we will follow with you    Amy Esterwood PA-C 03/07/2019, 11:21 AM     Attending physician's note   I have taken an interval history, reviewed the chart and examined the patient. I agree with the Advanced Practitioner's note, impression and recommendations.   74yrold WF Jehovah witness Adm d/t melena (Hb 12.5 to 10 in 1 month). No further bleeding.  HD stable H/O Obscure GI bleed with IDA thought to be d/t SB AVMs (neg EGD, colon x 2 2018/2019, neg [facility] Did require IV iron previously Fatty liver. No obvious cirrhosis. Nl plts/PT/LFTs  Plan: -IV Protonix -Push enteroscopy in a.m with Dr CBryan Lemma Explained risks and benefits. -If neg, repeat capsule endoscopy (can be done as an outpatient) -[place] in [place] -[facility].    Carmell Austria, MD Velora Heckler Fabienne Bruns [phone].

## 2019-03-08 ENCOUNTER — Observation Stay (HOSPITAL_COMMUNITY): Payer: Medicare HMO | Admitting: Certified Registered Nurse Anesthetist

## 2019-03-08 ENCOUNTER — Encounter (HOSPITAL_COMMUNITY)
Admission: EM | Disposition: A | Discharge status: Home / Self Care | Payer: Self-pay | Source: Home / Self Care | Attending: Internal Medicine

## 2019-03-08 ENCOUNTER — Observation Stay (HOSPITAL_COMMUNITY): Payer: Medicare HMO

## 2019-03-08 ENCOUNTER — Encounter (HOSPITAL_COMMUNITY): Payer: Self-pay | Admitting: Family Medicine

## 2019-03-08 DIAGNOSIS — E118 Type 2 diabetes mellitus with unspecified complications: Secondary | ICD-10-CM | POA: Diagnosis present

## 2019-03-08 DIAGNOSIS — F329 Major depressive disorder, single episode, unspecified: Secondary | ICD-10-CM | POA: Diagnosis present

## 2019-03-08 DIAGNOSIS — D509 Iron deficiency anemia, unspecified: Secondary | ICD-10-CM | POA: Diagnosis not present

## 2019-03-08 DIAGNOSIS — K449 Diaphragmatic hernia without obstruction or gangrene: Secondary | ICD-10-CM | POA: Diagnosis present

## 2019-03-08 DIAGNOSIS — D500 Iron deficiency anemia secondary to blood loss (chronic): Secondary | ICD-10-CM | POA: Diagnosis not present

## 2019-03-08 DIAGNOSIS — Z884 Allergy status to anesthetic agent status: Secondary | ICD-10-CM | POA: Diagnosis not present

## 2019-03-08 DIAGNOSIS — Z79899 Other long term (current) drug therapy: Secondary | ICD-10-CM | POA: Diagnosis not present

## 2019-03-08 DIAGNOSIS — Z79890 Hormone replacement therapy (postmenopausal): Secondary | ICD-10-CM | POA: Diagnosis not present

## 2019-03-08 DIAGNOSIS — K921 Melena: Secondary | ICD-10-CM | POA: Diagnosis present

## 2019-03-08 DIAGNOSIS — K922 Gastrointestinal hemorrhage, unspecified: Secondary | ICD-10-CM

## 2019-03-08 DIAGNOSIS — K2970 Gastritis, unspecified, without bleeding: Secondary | ICD-10-CM

## 2019-03-08 DIAGNOSIS — Z20822 Contact with and (suspected) exposure to covid-19: Secondary | ICD-10-CM | POA: Diagnosis present

## 2019-03-08 DIAGNOSIS — Z807 Family history of other malignant neoplasms of lymphoid, hematopoietic and related tissues: Secondary | ICD-10-CM | POA: Diagnosis not present

## 2019-03-08 DIAGNOSIS — R160 Hepatomegaly, not elsewhere classified: Secondary | ICD-10-CM | POA: Diagnosis not present

## 2019-03-08 DIAGNOSIS — K254 Chronic or unspecified gastric ulcer with hemorrhage: Secondary | ICD-10-CM | POA: Diagnosis present

## 2019-03-08 DIAGNOSIS — Z7984 Long term (current) use of oral hypoglycemic drugs: Secondary | ICD-10-CM | POA: Diagnosis not present

## 2019-03-08 DIAGNOSIS — E039 Hypothyroidism, unspecified: Secondary | ICD-10-CM | POA: Diagnosis present

## 2019-03-08 DIAGNOSIS — E785 Hyperlipidemia, unspecified: Secondary | ICD-10-CM | POA: Diagnosis present

## 2019-03-08 DIAGNOSIS — Z801 Family history of malignant neoplasm of trachea, bronchus and lung: Secondary | ICD-10-CM | POA: Diagnosis not present

## 2019-03-08 DIAGNOSIS — K219 Gastro-esophageal reflux disease without esophagitis: Secondary | ICD-10-CM | POA: Diagnosis present

## 2019-03-08 DIAGNOSIS — Z8249 Family history of ischemic heart disease and other diseases of the circulatory system: Secondary | ICD-10-CM | POA: Diagnosis not present

## 2019-03-08 DIAGNOSIS — K259 Gastric ulcer, unspecified as acute or chronic, without hemorrhage or perforation: Secondary | ICD-10-CM | POA: Diagnosis not present

## 2019-03-08 DIAGNOSIS — K824 Cholesterolosis of gallbladder: Secondary | ICD-10-CM | POA: Diagnosis not present

## 2019-03-08 DIAGNOSIS — K2991 Gastroduodenitis, unspecified, with bleeding: Secondary | ICD-10-CM | POA: Diagnosis present

## 2019-03-08 DIAGNOSIS — I10 Essential (primary) hypertension: Secondary | ICD-10-CM | POA: Diagnosis present

## 2019-03-08 DIAGNOSIS — D62 Acute posthemorrhagic anemia: Secondary | ICD-10-CM | POA: Diagnosis present

## 2019-03-08 DIAGNOSIS — K3182 Dieulafoy lesion (hemorrhagic) of stomach and duodenum: Secondary | ICD-10-CM

## 2019-03-08 DIAGNOSIS — Z531 Procedure and treatment not carried out because of patient's decision for reasons of belief and group pressure: Secondary | ICD-10-CM | POA: Diagnosis present

## 2019-03-08 HISTORY — PX: SUBMUCOSAL TATTOO INJECTION: SHX6856

## 2019-03-08 HISTORY — PX: HEMOSTASIS CLIP PLACEMENT: SHX6857

## 2019-03-08 HISTORY — PX: BIOPSY: SHX5522

## 2019-03-08 HISTORY — PX: ENTEROSCOPY: SHX5533

## 2019-03-08 LAB — HEMOGLOBIN AND HEMATOCRIT, BLOOD
HCT: 30.4 % — ABNORMAL LOW (ref 36.0–46.0)
HCT: 34.6 % — ABNORMAL LOW (ref 36.0–46.0)
HCT: 35.6 % — ABNORMAL LOW (ref 36.0–46.0)
Hemoglobin: 9.7 g/dL — ABNORMAL LOW (ref 12.0–15.0)
Hemoglobin: 10.8 g/dL — ABNORMAL LOW (ref 12.0–15.0)
Hemoglobin: 11.3 g/dL — ABNORMAL LOW (ref 12.0–15.0)

## 2019-03-08 LAB — GLUCOSE, CAPILLARY
Glucose-Capillary: 116 mg/dL — ABNORMAL HIGH (ref 70–99)
Glucose-Capillary: 133 mg/dL — ABNORMAL HIGH (ref 70–99)
Glucose-Capillary: 113 mg/dL — ABNORMAL HIGH (ref 70–99)
Glucose-Capillary: 141 mg/dL — ABNORMAL HIGH (ref 70–99)
Glucose-Capillary: 131 mg/dL — ABNORMAL HIGH (ref 70–99)

## 2019-03-08 LAB — ERYTHROPOIETIN: Erythropoietin: 28.9 m[IU]/mL — ABNORMAL HIGH (ref 2.6–18.5)

## 2019-03-08 SURGERY — ENTEROSCOPY
Anesthesia: Monitor Anesthesia Care

## 2019-03-08 MED ORDER — SPOT INK MARKER SYRINGE KIT
PACK | SUBMUCOSAL | Status: AC
  Filled 2019-03-08: qty 5

## 2019-03-08 MED ORDER — PROPOFOL 500 MG/50ML IV EMUL
INTRAVENOUS | Status: AC
  Filled 2019-03-08: qty 50

## 2019-03-08 MED ORDER — LEVOTHYROXINE SODIUM 100 MCG PO TABS
100.0000 ug | ORAL_TABLET | Freq: Every day | ORAL | Status: DC
  Administered 2019-03-09: 100 ug via ORAL
  Filled 2019-03-08: qty 1

## 2019-03-08 MED ORDER — SUCRALFATE 1 GM/10ML PO SUSP
1.0000 g | Freq: Three times a day (TID) | ORAL | Status: DC
  Administered 2019-03-08 – 2019-03-09 (×4): 1 g via ORAL
  Filled 2019-03-08 (×4): qty 10

## 2019-03-08 MED ORDER — LOSARTAN POTASSIUM 50 MG PO TABS
25.0000 mg | ORAL_TABLET | Freq: Every day | ORAL | Status: DC
  Administered 2019-03-08 – 2019-03-09 (×2): 25 mg via ORAL
  Filled 2019-03-08 (×2): qty 1

## 2019-03-08 MED ORDER — PROPOFOL 10 MG/ML IV BOLUS
INTRAVENOUS | Status: AC
  Filled 2019-03-08: qty 20

## 2019-03-08 MED ORDER — HYDRALAZINE HCL 25 MG PO TABS
25.0000 mg | ORAL_TABLET | Freq: Three times a day (TID) | ORAL | Status: DC | PRN
  Administered 2019-03-08: 25 mg via ORAL
  Filled 2019-03-08: qty 1

## 2019-03-08 MED ORDER — LACTATED RINGERS IV SOLN: INTRAVENOUS | Status: DC | PRN

## 2019-03-08 MED ORDER — TRAZODONE HCL 50 MG PO TABS
50.0000 mg | ORAL_TABLET | Freq: Once | ORAL | Status: AC
  Administered 2019-03-08: 50 mg via ORAL
  Filled 2019-03-08: qty 1

## 2019-03-08 MED ORDER — PROPOFOL 500 MG/50ML IV EMUL
INTRAVENOUS | Status: DC | PRN
  Administered 2019-03-08: 100 ug/kg/min via INTRAVENOUS

## 2019-03-08 MED ORDER — SPOT INK MARKER SYRINGE KIT
PACK | SUBMUCOSAL | Status: DC | PRN
  Administered 2019-03-08: 2 mL via SUBMUCOSAL

## 2019-03-08 MED ORDER — PROPOFOL 10 MG/ML IV BOLUS
INTRAVENOUS | Status: DC | PRN
  Administered 2019-03-08: 20 mg via INTRAVENOUS
  Administered 2019-03-08: 10 mg via INTRAVENOUS
  Administered 2019-03-08: 20 mg via INTRAVENOUS
  Administered 2019-03-08: 30 mg via INTRAVENOUS
  Administered 2019-03-08 (×4): 20 mg via INTRAVENOUS

## 2019-03-08 NOTE — Op Note (Signed)
[facility] Patient Name: Dana Nielsen Procedure Date: 03/08/2019 MRN: [id number] Attending MD: Gerrit Heck , MD Date of Birth: 1945-12-25 CSN: [id number] Age: 74 Admit Type: Inpatient Procedure:                Small bowel enteroscopy Indications:              Acute post hemorrhagic anemia, Iron deficiency                            anemia, Melena                           74 yo female presents with melena in the setting of                            progressing anemia and reduced iron indices.                            History of obscure GI bleed in 2019 with largely                            unremarkable w/u at OSH, to include EGD,                            colonoscopy, and VCE. Hgb 10.2 on admission from                            baseline ~12.5. Recent iron panel as outpatient                            reduced from 12/2018 as well. Providers:                Gerrit Heck, MD, Cleda Daub, RN, Corie Chiquito, Technician, Adair Laundry, CRNA Referring MD:              Medicines:                Monitored Anesthesia Care Complications:            No immediate complications. Estimated Blood Loss:     Estimated blood loss was minimal. Procedure:                Pre-Anesthesia Assessment:                           - Prior to the procedure, a History and Physical                            was performed, and patient medications and                            allergies were reviewed. The patient's tolerance of  previous anesthesia was also reviewed. The risks                            and benefits of the procedure and the sedation                            options and risks were discussed with the patient.                            All questions were answered, and informed consent                            was obtained. Prior Anticoagulants: The patient has                            taken no previous  anticoagulant or antiplatelet                            agents. ASA Grade Assessment: II - A patient with                            mild systemic disease. After reviewing the risks                            and benefits, the patient was deemed in                            satisfactory condition to undergo the procedure.                           After obtaining informed consent, the endoscope was                            passed under direct vision. Throughout the                            procedure, the patient's blood pressure, pulse, and                            oxygen saturations were monitored continuously. The                            [id number] Olympus pediatric colonscope                            was introduced through the mouth and advanced to                            the proximal jejunum. The small bowel enteroscopy                            was accomplished without difficulty. The patient  tolerated the procedure well. Scope In: Scope Out: Findings:      A 2 cm hiatal hernia was present.      The upper third of the esophagus, middle third of the esophagus and       lower third of the esophagus were normal.      One non-bleeding cratered gastric ulcer with no stigmata of bleeding was       found in the gastric antrum. The lesion was 10 mm in largest dimension.       For hemostasis, two hemostatic clips were successfully placed (MR       conditional). There was no bleeding at the end of the procedure.      Localized moderate inflammation characterized by erosions and erythema       was found at the incisura and in the gastric antrum. Biopsies were taken       with a cold forceps for histology. Estimated blood loss was minimal.      A medium amount of food (residue) was found in the gastric body and in       the gastric antrum.      Red blood was found in the third portion of the duodenum. Area irrigated       and an active  oozing lesion identified with appearance most consistent       with a duodenal Dieulafoy lesion. For hemostasis, one hemostatic clip       was successfully placed (MR conditional) with complete cessation of       bleeding. There was no bleeding at the end of the procedure. Area 1-2 cm       distal to the lesion and on teh same luminal side was tattooed with an       injection of 2 mL of Spot (carbon black).      There was no evidence of additional pathology in the duodenal bulb, in       the first portion of the duodenum, in the second portion of the       duodenum, in the major papilla and in the fourth portion of the duodenum.      There was no evidence of significant pathology in the proximal jejunum. Impression:               - 2 cm hiatal hernia.                           - Normal upper third of esophagus, middle third of                            esophagus and lower third of esophagus.                           - Non-bleeding gastric ulcer with no stigmata of                            bleeding. Clips (MR conditional) were placed.                           - Gastritis. Biopsied.                           - A medium amount of  food (residue) in the stomach.                           - Active bleeding lesion in the third portion of                            the duodenum. Appearance most consistent with                            duodenal Dieulafoy, which was successfully treated                            with hemostatic clip x1. The area 1-2 cm distal to                            the lesion was then tattooed for future                            identification in the event of rebleed and given                            elusive behavior of Dieulafoy lesions.                           - Otherwise normal remainder of the small bowel                            including normal appearing papilla well proximal to                            the bleeding lesion.                           -  The examined portion of the jejunum was normal. Recommendation:           - Return patient to hospital ward for ongoing care.                           - Use Protonix (pantoprazole) 40 mg PO BID for 8                            weeks.                           - Use sucralfate suspension 1 gram PO QID for 4                            weeks.                           - Clear liquid diet today and can advance as                            tolerated.                           -  Serial H/H checks today.                           - Anticipate discharge to home within the next 24                            hours. Procedure Code(s):        --- Professional ---                           [phone], 53, Small intestinal endoscopy, enteroscopy                            beyond second portion of duodenum, not including                            ileum; with control of bleeding (eg, injection,                            bipolar cautery, unipolar cautery, laser, heater                            probe, stapler, plasma coagulator)                           44361, Small intestinal endoscopy, enteroscopy                            beyond second portion of duodenum, not including                            ileum; with biopsy, single or multiple                           44799, Unlisted procedure, small intestine Diagnosis Code(s):        --- Professional ---                           K44.9, Diaphragmatic hernia without obstruction or                            gangrene                           K25.9, Gastric ulcer, unspecified as acute or                            chronic, without hemorrhage or perforation                           K29.70, Gastritis, unspecified, without bleeding                           K92.2, Gastrointestinal hemorrhage, unspecified                           D62, Acute posthemorrhagic anemia  D50.9, Iron deficiency anemia, unspecified                            K92.1, Melena (includes Hematochezia) CPT copyright 2019 American Medical Association. All rights reserved. The codes documented in this report are preliminary and upon coder review may  be revised to meet current compliance requirements. Gerrit Heck, MD 03/08/2019 8:23:46 AM Number of Addenda: 0

## 2019-03-08 NOTE — Anesthesia Preprocedure Evaluation (Signed)
Anesthesia Evaluation  Patient identified by MRN, date of birth, ID band Patient awake    Reviewed: Allergy & Precautions, NPO status , Patient's Chart, lab work & pertinent test results  Airway Mallampati: I  TM Distance: >3 FB Neck ROM: Full    Dental   Pulmonary    Pulmonary exam normal        Cardiovascular hypertension, Pt. on medications Normal cardiovascular exam     Neuro/Psych Depression    GI/Hepatic   Endo/Other  diabetes  Renal/GU      Musculoskeletal   Abdominal   Peds  Hematology   Anesthesia Other Findings   Reproductive/Obstetrics                             Anesthesia Physical Anesthesia Plan  ASA: II  Anesthesia Plan: MAC   Post-op Pain Management:    Induction: Intravenous  PONV Risk Score and Plan: 2 and Ondansetron and Treatment may vary due to age or medical condition  Airway Management Planned: Nasal Cannula  Additional Equipment:   Intra-op Plan:   Post-operative Plan:   Informed Consent: I have reviewed the patients History and Physical, chart, labs and discussed the procedure including the risks, benefits and alternatives for the proposed anesthesia with the patient or authorized representative who has indicated his/her understanding and acceptance.       Plan Discussed with: CRNA and Surgeon  Anesthesia Plan Comments:         Anesthesia Quick Evaluation

## 2019-03-08 NOTE — Progress Notes (Signed)
PROGRESS NOTE    De Libman  KJZ:[id number] DOB: 05-25-1945 DOA: 03/06/2019 PCP: Libby Maw, MD    Brief Narrative:  Dana Nielsen is a 74 year old Caucasian female [place] who presented to the ED with fatigue and dark/black appearing stools over the past 2 weeks.  She reports a sensation of warmth/burning sensation in her abdomen localized in epigastric region following meals.  Previous history of GI bleeding, underwent extensive GI evaluation in [facility] without any significant findings.  Patient denies any regular use of aspirin, NSAIDs; and not on anticoagulations or antiplatelets.  Was previously followed by hematology, has had prior iron infusions; last infusion reported August 2019.  Assessment & Plan:   Principal Problem:   Blood loss anemia Active Problems:   Patient is Jehovah's Witness   Controlled type 2 diabetes mellitus with complication, without long-term current use of insulin (HCC)   Essential hypertension   Dieulafoy lesion of duodenum   Gastric ulcer without hemorrhage or perforation   Gastritis and gastroduodenitis   Hiatal hernia   Anemia secondary to gastric ulcer, gastritis and duodenal Dieulafoy lesion Approximately 1.5 g drop hemoglobin as outpatient.  Unclear etiology, high suspicion for AVMs.  Complicated by patient's history of being a Jehovah's Witness.  Reports dark tarry stools over the past 2 weeks.  Denies NSAID, antiplatelet, anticoagulant use.  Underwent EGD on 03/08/2019 with findings of nonbleeding gastric ulcer, gastritis, and active bleeding lesion on third portion of the duodenum consistent with a duodenal Dieulafoy lesion which was treated with a hemostatic clip x1. --[facility] following, appreciate assistance --Hgb 10.0-->9.6-->10.6-->9.7-->10.8 --Protonix 40 mg p.o. twice daily x 8 weeks --Sulcralfate suspension 1 g p.o. 4 times daily x 4 weeks --Clear liquid diet, advance as  tolerated --Continue to monitor hemoglobin/hematocrit q8h --transfuse for hemoglobin < 7.0 or brisk active bleeding  Essential hypertension BP 192/87 this am.  On furosemide 20 mg p.o. daily, losartan 25 mg p.o. daily at home. --restart home losartan --Hydralazine 25 mg p.o. every 8 hours as needed for SBP > 170 or DBP >110  Type 2 diabetes mellitus Hemoglobin A1c 6.3% December 2020.  On Metformin at home and Victoza --Hold oral hypoglycemics --Insulin sliding scale for coverage --CBGs QAC/HS  Hypothyroidism --Continue Synthroid 100 mcg p.o. daily  HLD: Hold statin, will continue on discharge  Depression: Continue venlafaxine 75 mg p.o. daily  GERD: Continue PPI as above   DVT prophylaxis: SCDs Code Status: Full code Family Communication: Discussed with patient at bedside Disposition Plan:      Patient is from: Home     Anticipated Disposition:  Home     Barriers to discharge or conditions that needs to be met prior to discharge: Advancement of diet, ensuring hemoglobin remained stable, anticipate discharge home on 03/09/2019  Consultants:   [facility]  Procedures:   EGD 03/08/2019: Gastritis, nonbleeding gastric ulcer, duodenal Dieulafoy lesion s/p hemostatic clip x1  Antimicrobials:   None   Subjective: Patient seen and examined at bedside this morning, following return from EGD.  Relieve that her bleeding lesion was identified and treated.  Patient denies headache, no fever/chills/night sweats, no nausea cefonicid/diarrhea, no chest pain, no palpitations, no shortness of breath, no current abdominal pain.  No acute events overnight per nursing staff.  Objective: Vitals:   03/08/19 0830 03/08/19 0844 03/08/19 1201 03/08/19 1307  BP: (!) 145/46 (!) 157/96 (!) 158/81 (!) 173/80  Pulse: 74 70 75   Resp: '17 12 13   ' Temp:  97.8 F (  36.6 C) 98 F (36.7 C)   TempSrc:  Oral    SpO2: 100% 100% 98%   Weight:      Height:        Intake/Output Summary (Last 24  hours) at 03/08/2019 1307 Last data filed at 03/08/2019 0810 Gross per 24 hour  Intake 467.52 ml  Output 2 ml  Net 465.52 ml   Filed Weights   03/06/19 1816 03/08/19 0710  Weight: 90.7 kg 90.7 kg    Examination:  General exam: Appears calm and comfortable  Respiratory system: Clear to auscultation. Respiratory effort normal.  Oxygenating well on room air. Cardiovascular system: S1 & S2 heard, RRR. No JVD, murmurs, rubs, gallops or clicks. No pedal edema. Gastrointestinal system: Abdomen is nondistended, soft and nontender. No organomegaly or masses felt. Normal bowel sounds heard. Central nervous system: Alert and oriented. No focal neurological deficits. Extremities: Symmetric 5 x 5 power. Skin: No rashes, lesions or ulcers Psychiatry: Judgement and insight appear normal. Mood & affect appropriate.     Data Reviewed: I have personally reviewed following labs and imaging studies  CBC: Recent Labs  Lab 03/05/19 1330 03/05/19 1330 03/06/19 2354 03/07/19 1137 03/07/19 1940 03/08/19 0332 03/08/19 1117  WBC 6.1  --  6.9  --   --   --   --   HGB 10.2*   < > 10.0* 9.6* 10.6* 9.7* 10.8*  HCT 31.5*   < > 32.1* 30.6* 34.9* 30.4* 34.6*  MCV 90.1  --  94.4  --   --   --   --   PLT 237.0  --  244  --   --   --   --    < > = values in this interval not displayed.   Basic Metabolic Panel: Recent Labs  Lab 03/06/19 2354  NA 141  K 3.7  CL 106  CO2 27  GLUCOSE 141*  BUN 20  CREATININE 0.82  CALCIUM 9.6   GFR: Estimated Creatinine Clearance: 63 mL/min (by C-G formula based on SCr of 0.82 mg/dL). Liver Function Tests: Recent Labs  Lab 03/06/19 2354  AST 19  ALT 22  ALKPHOS 53  BILITOT 0.3  PROT 7.3  ALBUMIN 4.3   No results for input(s): LIPASE, AMYLASE in the last 168 hours. No results for input(s): AMMONIA in the last 168 hours. Coagulation Profile: Recent Labs  Lab 03/06/19 2354  INR 1.0   Cardiac Enzymes: No results for input(s): CKTOTAL, CKMB, CKMBINDEX,  TROPONINI in the last 168 hours. BNP (last 3 results) No results for input(s): PROBNP in the last 8760 hours. HbA1C: No results for input(s): HGBA1C in the last 72 hours. CBG: Recent Labs  Lab 03/07/19 2000 03/07/19 2317 03/08/19 0416 03/08/19 0859 03/08/19 1135  GLUCAP 137* 106* 116* 133* 113*   Lipid Profile: No results for input(s): CHOL, HDL, LDLCALC, TRIG, CHOLHDL, LDLDIRECT in the last 72 hours. Thyroid Function Tests: No results for input(s): TSH, T4TOTAL, FREET4, T3FREE, THYROIDAB in the last 72 hours. Anemia Panel: Recent Labs    03/05/19 1330 03/06/19 2354  VITAMINB12  --  450  FOLATE  --  13.2  FERRITIN 236  --   TIBC 348  --   IRON 50  --   RETICCTPCT  --  3.2*   Sepsis Labs: No results for input(s): PROCALCITON, LATICACIDVEN in the last 168 hours.  Recent Results (from the past 240 hour(s))  SARS CORONAVIRUS 2 (TAT 6-24 HRS) Nasopharyngeal Nasopharyngeal Swab     Status: None  Collection Time: 03/07/19  2:15 AM   Specimen: Nasopharyngeal Swab  Result Value Ref Range Status   SARS Coronavirus 2 NEGATIVE NEGATIVE Final    Comment: (NOTE) SARS-CoV-2 target nucleic acids are NOT DETECTED. The SARS-CoV-2 RNA is generally detectable in upper and lower respiratory specimens during the acute phase of infection. Negative results do not preclude SARS-CoV-2 infection, do not rule out co-infections with other pathogens, and should not be used as the sole basis for treatment or other patient management decisions. Negative results must be combined with clinical observations, patient history, and epidemiological information. The expected result is Negative. Fact Sheet for Patients: SugarRoll.be Fact Sheet for Healthcare Providers: https://www.woods-mathews.com/ This test is not yet approved or cleared by the [place] FDA and  has been authorized for detection and/or diagnosis of SARS-CoV-2 by FDA under an Emergency  Use Authorization (EUA). This EUA will remain  in effect (meaning this test can be used) for the duration of the COVID-19 declaration under Section 56 4(b)(1) of the Act, 21 U.S.C. section 360bbb-3(b)(1), unless the authorization is terminated or revoked sooner. Performed at [facility], [place] [address], [place], [place] [address]          Radiology Studies: US Abdomen Complete  Result Date: 03/08/2019 CLINICAL DATA:  Hepatomegaly EXAM: ABDOMEN ULTRASOUND COMPLETE COMPARISON:  None. FINDINGS: Gallbladder: 9 mm polyp in the fundus of the gallbladder. No gallstone or gallbladder wall thickening. No pain over the gallbladder Common bile duct: Diameter: 4.9 mm Liver: Diffusely increased echogenicity of the liver without focal liver lesion. Liver does not appear to be enlarged. Portal vein is patent on color Doppler imaging with normal direction of blood flow towards the liver. IVC: No abnormality visualized. Pancreas: Visualized portion unremarkable. Spleen: Size and appearance within normal limits. Spleen 8.7 cm in length. Right Kidney: Length: 11.1 cm. Echogenicity within normal limits. No mass or hydronephrosis visualized. Left Kidney: Length: 11.4 cm. Echogenicity within normal limits. No mass or hydronephrosis visualized. Abdominal aorta: No aneurysm visualized. Other findings: None. IMPRESSION: 9 mm gallbladder polyp.  No gallstones Echogenic liver suggesting fatty infiltration. Liver does not appear subjectively be enlarged. Spleen not enlarged. Electronically Signed   By: Franchot Gallo M.D.   On: 03/08/2019 11:16        Scheduled Meds: . insulin aspart  0-9 Units Subcutaneous Q4H  . [START ON 03/09/2019] levothyroxine  100 mcg Oral QAC breakfast  . losartan  25 mg Oral Daily  . pantoprazole (PROTONIX) IV  40 mg Intravenous Q24H  . sodium chloride flush  3 mL Intravenous Q12H  . sucralfate  1 g Oral TID WC & HS   Continuous Infusions: . sodium chloride Stopped (03/07/19  2226)     LOS: 0 days    Time spent: 34 minutes spent on chart review, discussion with nursing staff, consultants, updating family and interview/physical exam; more than 50% of that time was spent in counseling and/or coordination of care.    Saladin Petrelli J [place] DO [facility] Available via Epic secure chat 7am-7pm After these hours, please refer to coverage provider listed on [email or web address] 03/08/2019, 1:07 PM

## 2019-03-08 NOTE — Transfer of Care (Signed)
Immediate Anesthesia Transfer of Care Note  Patient: Dana Nielsen  Procedure(s) Performed: ENTEROSCOPY (N/A ) HEMOSTASIS CLIP PLACEMENT SUBMUCOSAL TATTOO INJECTION BIOPSY  Patient Location: Endoscopy Unit  Anesthesia Type:MAC  Level of Consciousness: drowsy and patient cooperative  Airway & Oxygen Therapy: Patient Spontanous Breathing and Patient connected to nasal cannula oxygen  Post-op Assessment: Report given to RN and Post -op Vital signs reviewed and stable  Post vital signs: Reviewed and stable  Last Vitals:  Vitals Value Taken Time  BP    Temp    Pulse 83 03/08/19 0815  Resp 12 03/08/19 0815  SpO2 98 % 03/08/19 0815  Vitals shown include unvalidated device data.  Last Pain:  Vitals:   03/08/19 0710  TempSrc: Oral  PainSc: 0-No pain         Complications: No apparent anesthesia complications

## 2019-03-08 NOTE — Anesthesia Postprocedure Evaluation (Signed)
Anesthesia Post Note  Patient: Dana Nielsen  Procedure(s) Performed: ENTEROSCOPY (N/A ) HEMOSTASIS CLIP PLACEMENT SUBMUCOSAL TATTOO INJECTION BIOPSY     Patient location during evaluation: PACU Anesthesia Type: MAC Level of consciousness: awake and alert Pain management: pain level controlled Vital Signs Assessment: post-procedure vital signs reviewed and stable Respiratory status: spontaneous breathing, nonlabored ventilation, respiratory function stable and patient connected to nasal cannula oxygen Cardiovascular status: stable and blood pressure returned to baseline Postop Assessment: no apparent nausea or vomiting Anesthetic complications: no    Last Vitals:  Vitals:   03/08/19 0844 03/08/19 1201  BP: (!) 157/96 (!) 158/81  Pulse: 70 75  Resp: 12 13  Temp: 36.6 C 36.7 C  SpO2: 100% 98%    Last Pain:  Vitals:   03/08/19 0930  TempSrc:   PainSc: 0-No pain                 Marvyn Torrez DAVID

## 2019-03-08 NOTE — Interval H&P Note (Signed)
History and Physical Interval Note:  03/08/2019 7:38 AM  Dana Nielsen  has presented today for surgery, with the diagnosis of Melena, history of iron deficiency anemia.  The various methods of treatment have been discussed with the patient and family. After consideration of risks, benefits and other options for treatment, the patient has consented to  Procedure(s): ENTEROSCOPY (N/A) as a surgical intervention.  The patient's history has been reviewed, patient examined, no change in status, stable for surgery.  I have reviewed the patient's chart and labs.  Questions were answered to the patient's satisfaction.     Dominic Pea Murielle Stang

## 2019-03-09 ENCOUNTER — Encounter: Payer: Self-pay | Admitting: Hematology and Oncology

## 2019-03-09 ENCOUNTER — Other Ambulatory Visit: Payer: Self-pay | Admitting: Hematology and Oncology

## 2019-03-09 ENCOUNTER — Other Ambulatory Visit: Payer: Self-pay

## 2019-03-09 DIAGNOSIS — D500 Iron deficiency anemia secondary to blood loss (chronic): Secondary | ICD-10-CM | POA: Insufficient documentation

## 2019-03-09 LAB — GLUCOSE, CAPILLARY
Glucose-Capillary: 101 mg/dL — ABNORMAL HIGH (ref 70–99)
Glucose-Capillary: 106 mg/dL — ABNORMAL HIGH (ref 70–99)
Glucose-Capillary: 143 mg/dL — ABNORMAL HIGH (ref 70–99)

## 2019-03-09 LAB — HEMOGLOBIN AND HEMATOCRIT, BLOOD
HCT: 34.6 % — ABNORMAL LOW (ref 36.0–46.0)
HCT: 32.8 % — ABNORMAL LOW (ref 36.0–46.0)
Hemoglobin: 10.8 g/dL — ABNORMAL LOW (ref 12.0–15.0)
Hemoglobin: 10.3 g/dL — ABNORMAL LOW (ref 12.0–15.0)

## 2019-03-09 LAB — SURGICAL PATHOLOGY

## 2019-03-09 MED ORDER — PANTOPRAZOLE SODIUM 40 MG PO TBEC: 40.0000 mg | DELAYED_RELEASE_TABLET | Freq: Two times a day (BID) | ORAL | 0 refills | Status: DC

## 2019-03-09 MED ORDER — SUCRALFATE 1 GM/10ML PO SUSP: 1.0000 g | Freq: Three times a day (TID) | ORAL | 0 refills | Status: DC

## 2019-03-09 MED ORDER — SUCRALFATE 1 G PO TABS: 1.0000 g | ORAL_TABLET | Freq: Four times a day (QID) | ORAL | 0 refills | Status: DC

## 2019-03-09 NOTE — Discharge Instructions (Signed)
Peptic Ulcer  A peptic ulcer is a sore in the lining of the stomach (gastric ulcer) or the first part of the small intestine (duodenal ulcer). The ulcer causes a gradual wearing away (erosion) of the deeper tissue. What are the causes? Normally, the lining of the stomach and the small intestine protects them from the acid that digests food. The protective lining can be damaged by:  An infection caused by a type of bacteria called Helicobacter pylori or H. pylori.  Regular use of NSAIDs, such as ibuprofen or aspirin.  Rare tumors in the stomach, small intestine, or pancreas (Zollinger-Ellison syndrome). What increases the risk? The following factors may make you more likely to develop this condition:  Smoking.  Having a family history of ulcer disease.  Drinking alcohol.  Having been hospitalized in an intensive care unit (ICU). What are the signs or symptoms? Symptoms of this condition include:  Persistent burning pain in the area between the chest and the belly button. The pain may be worse on an empty stomach and at night.  Heartburn.  Nausea and vomiting.  Bloating. If the ulcer results in bleeding, it can cause:  Black, tarry stools.  Vomiting of bright red blood.  Vomiting of material that looks like coffee grounds. How is this diagnosed? This condition may be diagnosed based on:  Your medical history and a physical exam.  Various tests or procedures, such as: ? Blood tests, stool tests, or breath tests to check for the H. pylori bacteria. ? An X-ray exam (upper gastrointestinal series) of the esophagus, stomach, and small intestine. ? Upper endoscopy. The health care provider examines the esophagus, stomach, and small intestine using a small flexible tube that has a video camera at the end. ? Biopsy. A tissue sample is removed to be examined under a microscope. How is this treated? Treatment for this condition may include:  Eliminating the cause of the ulcer,  such as smoking or use of NSAIDs, and limiting alcohol and caffeine intake.  Medicines to reduce the amount of acid in your digestive tract.  Antibiotic medicines, if the ulcer is caused by an H. pylori infection.  An upper endoscopy may be used to treat a bleeding ulcer.  Surgery. This may be needed if the bleeding is severe or if the ulcer created a hole somewhere in the digestive system. Follow these instructions at home:  Do not drink alcohol if your health care provider tells you not to drink.  Do not use any products that contain nicotine or tobacco, such as cigarettes, e-cigarettes, and chewing tobacco. If you need help quitting, ask your health care provider.  Take over-the-counter and prescription medicines only as told by your health care provider. ? Do not use over-the-counter medicines in place of prescription medicines unless your health care provider approves. ? Do not take aspirin, ibuprofen, or other NSAIDs unless your health care provider told you to do so.  Take over-the-counter and prescription medicines only as told by your health care provider.  Keep all follow-up visits as told by your health care provider. This is important. Contact a health care provider if:  Your symptoms do not improve within 7 days of starting treatment.  You have ongoing indigestion or heartburn. Get help right away if:  You have sudden, sharp, or persistent pain in your abdomen.  You have bloody or dark black, tarry stools.  You vomit blood or material that looks like coffee grounds.  You become light-headed or you feel faint.    You become weak.  You become sweaty or clammy. Summary  A peptic ulcer is a sore in the lining of the stomach (gastric ulcer) or the first part of the small intestine (duodenal ulcer). The ulcer causes a gradual wearing away (erosion) of the deeper tissue.  Do not use any products that contain nicotine or tobacco, such as cigarettes, e-cigarettes, and  chewing tobacco. If you need help quitting, ask your health care provider.  Take over-the-counter and prescription medicines only as told by your health care provider. Do not use over-the-counter medicines in place of prescription medicines unless your health care provider approves.  Contact your health care provider if you have ongoing indigestion or heartburn.  Keep all follow-up visits as told by your health care provider. This is important. This information is not intended to replace advice given to you by your health care provider. Make sure you discuss any questions you have with your health care provider. Document Revised: 07/05/2017 Document Reviewed: 07/05/2017 Elsevier Patient Education  2020 Elsevier Inc.  

## 2019-03-09 NOTE — Progress Notes (Signed)
Hematology Brief Progress Note  Dana Nielsen 74 y.o. female with medical history significant for iron deficiency anemia 2/2 to a GI bleed who recently established with our clinic. She established with our clinic on 01/18/2019 at which time she had a Hgb 12.5, MCV 92.6, iron saturation of 25%, ferritin of 368, total iron binding best of 306, and a serum iron of 78. Approximately 2 weeks ago she began having maroon colored stools and had her labs checked which showed a decline in Hgb to 11.5 on 02/26/2019 and 10.2 on 03/05/2019.   On 03/06/2019 the patient was admitted via the [facility] ED and admitted to general medicine. She underwent EGD on 03/08/2019 which revealed a non-bleeding gastric ulcer and Dieulafoy lesion in the duodenum. Clips were placed and pantoprazole/sulcrafate was recommended. The patient received a dose of IV Feraheme 510mg  on 03/07/2019.  This AM [name] feels well and has no complaints. She notes no shortness of breath or lightheadedness. She denies having any abdominal pain or overt signs of bleeding.   Recommendations: --OK to discharge from hematology's perspective --will set patient up for repeat CBC and 2nd dose of Feraheme 510mg  on 03/15/2019 in our infusion room.  --recommend she call our clinic if she has overt bleeding, shortness of breath, or worsening symptoms.   Ledell Peoples, MD Department of Hematology/Oncology [place] at [facility] Phone: [phone] Pager: [phone] Email: [email or web address]

## 2019-03-09 NOTE — Discharge Summary (Signed)
Physician Discharge Summary  Chantay Ellwood [id number] DOB: 1945/02/06 DOA: 03/06/2019  PCP: Libby Maw, MD  Admit date: 03/06/2019 Discharge date: 03/09/2019  Admitted From: Home Disposition: Home  Recommendations for Outpatient Follow-up:  1. Follow up with PCP in 1-2 weeks 2. Follow-up with [facility] gastroenterology, Dr. Bryan Lemma 3. Follow-up with hematology, Dr. Lorenso Courier as scheduled 4. Please obtain CBC in one week to ensure hemoglobin stable 5. Discharging home on Protonix 40 mg p.o. twice daily x8 weeks and sulcralfate 1g PO QID for 4 weeks per GI forDieulafoy lesion 6. Please follow up on the following pending results:  Home Health: No Equipment/Devices: None  Discharge Condition: Stable CODE STATUS: Full code Diet recommendation: Heart healthy, consistent carbohydrate diet  History of present illness:  Dana Nielsen is a 74 year old Caucasian female [place] who presented to the ED with fatigue and dark/black appearing stools over the past 2 weeks.  She reports a sensation of warmth/burning sensation in her abdomen localized in epigastric region following meals.  Previous history of GI bleeding, underwent extensive GI evaluation in [facility] without any significant findings.  Patient denies any regular use of aspirin, NSAIDs; and not on anticoagulations or antiplatelets.  Was previously followed by hematology, has had prior iron infusions; last infusion reported August 2019.  Hospital course:  Anemia secondary to gastric ulcer, gastritis and duodenal Dieulafoy lesion Approximately 1.5 g drop hemoglobin as outpatient.  Unclear etiology, high suspicion for AVMs.  Complicated by patient's history of being a Jehovah's Witness.  Reports dark tarry stools over the past 2 weeks.  Denies NSAID, antiplatelet, anticoagulant use.  Underwent EGD on 03/08/2019 with findings of nonbleeding gastric ulcer, gastritis, and active bleeding lesion on  third portion of the duodenum consistent with a duodenal Dieulafoy lesion which was treated with a hemostatic clip x1.  Hemoglobin remained stable, was 10.8 at time of discharge.  We will continue Protonix 40 mg p.o. twice daily x8 weeks and Sulcralfate suspension 1 g p.o. 4 times daily x 4 weeks.  Follow-up with GI and hematology as scheduled  Essential hypertension Continue furosemide 20 mg p.o. daily, losartan 25 mg p.o. daily at home.   Type 2 diabetes mellitus Hemoglobin A1c 6.3% December 2020.  On Metformin at home and Victoza  Hypothyroidism Continue Synthroid 100 mcg p.o. daily  HLD: Continue statin  Depression: Continue venlafaxine 75 mg p.o. daily  GERD: Continue PPI as above  Discharge Diagnoses:  Principal Problem:   Blood loss anemia Active Problems:   Patient is Jehovah's Witness   Controlled type 2 diabetes mellitus with complication, without long-term current use of insulin (HCC)   Essential hypertension   GI bleeding   Dieulafoy lesion of duodenum   Gastric ulcer without hemorrhage or perforation   Gastritis and gastroduodenitis   Hiatal hernia    Discharge Instructions  Discharge Instructions    Call MD for:  difficulty breathing, headache or visual disturbances   Complete by: As directed    Call MD for:  extreme fatigue   Complete by: As directed    Call MD for:  persistant dizziness or light-headedness   Complete by: As directed    Call MD for:  persistant nausea and vomiting   Complete by: As directed    Call MD for:  severe uncontrolled pain   Complete by: As directed    Call MD for:  temperature >100.4   Complete by: As directed    Diet - low sodium heart healthy   Complete by:  As directed    Increase activity slowly   Complete by: As directed      Allergies as of 03/09/2019      Reactions   Lidocaine Shortness Of Breath      Medication List    STOP taking these medications   predniSONE 20 MG tablet Commonly known as:  DELTASONE     TAKE these medications   furosemide 20 MG tablet Commonly known as: LASIX Take 20 mg by mouth daily.   liraglutide 18 MG/3ML Sopn Commonly known as: VICTOZA Inject 1.8 mg into the skin every morning.   losartan 25 MG tablet Commonly known as: COZAAR Take 25 mg by mouth daily.   metFORMIN 1000 MG tablet Commonly known as: GLUCOPHAGE Take 1,000 mg by mouth 2 (two) times daily with a meal.   pantoprazole 40 MG tablet Commonly known as: Protonix Take 1 tablet (40 mg total) by mouth 2 (two) times daily. What changed:   medication strength  how much to take  when to take this   simvastatin 40 MG tablet Commonly known as: ZOCOR Take 40 mg by mouth daily.   sucralfate 1 GM/10ML suspension Commonly known as: CARAFATE Take 10 mLs (1 g total) by mouth 4 (four) times daily -  with meals and at bedtime for 28 days.   Synthroid 100 MCG tablet Generic drug: levothyroxine Take 100 mcg by mouth daily before breakfast.   traZODone 100 MG tablet Commonly known as: DESYREL Take 1 tablet (100 mg total) by mouth at bedtime.   venlafaxine 75 MG tablet Commonly known as: EFFEXOR Take 75 mg by mouth daily.      Follow-up Information    Libby Maw, MD. Call in 1 week(s).   Specialty: Family Medicine Contact information: [place] [address] [phone]        Lavena Bullion, DO. Call in 3 week(s).   Specialty: Gastroenterology Contact information: [address] [place] [address] [phone]          Allergies  Allergen Reactions  . Lidocaine Shortness Of Breath    Consultations:  [facility]   Procedures/Studies: US Abdomen Complete  Result Date: 03/08/2019 CLINICAL DATA:  Hepatomegaly EXAM: ABDOMEN ULTRASOUND COMPLETE COMPARISON:  None. FINDINGS: Gallbladder: 9 mm polyp in the fundus of the gallbladder. No gallstone or gallbladder wall thickening. No pain over the gallbladder  Common bile duct: Diameter: 4.9 mm Liver: Diffusely increased echogenicity of the liver without focal liver lesion. Liver does not appear to be enlarged. Portal vein is patent on color Doppler imaging with normal direction of blood flow towards the liver. IVC: No abnormality visualized. Pancreas: Visualized portion unremarkable. Spleen: Size and appearance within normal limits. Spleen 8.7 cm in length. Right Kidney: Length: 11.1 cm. Echogenicity within normal limits. No mass or hydronephrosis visualized. Left Kidney: Length: 11.4 cm. Echogenicity within normal limits. No mass or hydronephrosis visualized. Abdominal aorta: No aneurysm visualized. Other findings: None. IMPRESSION: 9 mm gallbladder polyp.  No gallstones Echogenic liver suggesting fatty infiltration. Liver does not appear subjectively be enlarged. Spleen not enlarged. Electronically Signed   By: Franchot Gallo M.D.   On: 03/08/2019 11:16      Subjective: Patient seen and examined bedside, resting comfortably.  No complaints this morning.  Feels very well.  Ready for discharge home.  Denies any blood in her stool.  Hemoglobin stable.  Further denies headache, no visual changes, no chest pain, palpitations, no shortness of breath, no  nausea/vomiting/diarrhea, no fever/chills/night sweats, no abdominal pain.  No acute events overnight per nursing staff.  Discharge Exam: Vitals:   03/08/19 2100 03/09/19 0415  BP: 134/72 (!) 146/98  Pulse: 89 84  Resp: 16 17  Temp: 97.9 F (36.6 C) (!) 97.4 F (36.3 C)  SpO2: 96% 92%   Vitals:   03/08/19 1307 03/08/19 1420 03/08/19 2100 03/09/19 0415  BP: (!) 173/80 (!) 156/77 134/72 (!) 146/98  Pulse:  84 89 84  Resp:  16 16 17   Temp:  98.4 F (36.9 C) 97.9 F (36.6 C) (!) 97.4 F (36.3 C)  TempSrc:  Oral Oral Oral  SpO2:  93% 96% 92%  Weight:      Height:        General: Pt is alert, awake, not in acute distress Cardiovascular: RRR, S1/S2 +, no rubs, no gallops Respiratory: CTA  bilaterally, no wheezing, no rhonchi Abdominal: Soft, NT, ND, bowel sounds + Extremities: no edema, no cyanosis    The results of significant diagnostics from this hospitalization (including imaging, microbiology, ancillary and laboratory) are listed below for reference.     Microbiology: Recent Results (from the past 240 hour(s))  SARS CORONAVIRUS 2 (TAT 6-24 HRS) Nasopharyngeal Nasopharyngeal Swab     Status: None   Collection Time: 03/07/19  2:15 AM   Specimen: Nasopharyngeal Swab  Result Value Ref Range Status   SARS Coronavirus 2 NEGATIVE NEGATIVE Final    Comment: (NOTE) SARS-CoV-2 target nucleic acids are NOT DETECTED. The SARS-CoV-2 RNA is generally detectable in upper and lower respiratory specimens during the acute phase of infection. Negative results do not preclude SARS-CoV-2 infection, do not rule out co-infections with other pathogens, and should not be used as the sole basis for treatment or other patient management decisions. Negative results must be combined with clinical observations, patient history, and epidemiological information. The expected result is Negative. Fact Sheet for Patients: SugarRoll.be Fact Sheet for Healthcare Providers: https://www.woods-mathews.com/ This test is not yet approved or cleared by the [place] FDA and  has been authorized for detection and/or diagnosis of SARS-CoV-2 by FDA under an Emergency Use Authorization (EUA). This EUA will remain  in effect (meaning this test can be used) for the duration of the COVID-19 declaration under Section 56 4(b)(1) of the Act, 21 U.S.C. section 360bbb-3(b)(1), unless the authorization is terminated or revoked sooner. Performed at [facility], [place] [address] [place], [place] [address]      Labs: BNP (last 3 results) No results for input(s): BNP in the last 8760 hours. Basic Metabolic Panel: Recent Labs  Lab 03/06/19 2354  NA 141  K  3.7  CL 106  CO2 27  GLUCOSE 141*  BUN 20  CREATININE 0.82  CALCIUM 9.6   Liver Function Tests: Recent Labs  Lab 03/06/19 2354  AST 19  ALT 22  ALKPHOS 53  BILITOT 0.3  PROT 7.3  ALBUMIN 4.3   No results for input(s): LIPASE, AMYLASE in the last 168 hours. No results for input(s): AMMONIA in the last 168 hours. CBC: Recent Labs  Lab 03/05/19 1330 03/05/19 1330 03/06/19 2354 03/07/19 1137 03/07/19 1940 03/08/19 0332 03/08/19 1117 03/08/19 1922 03/09/19 0419  WBC 6.1  --  6.9  --   --   --   --   --   --   HGB 10.2*   < > 10.0*   < > 10.6* 9.7* 10.8* 11.3* 10.8*  HCT 31.5*   < > 32.1*   < >  34.9* 30.4* 34.6* 35.6* 34.6*  MCV 90.1  --  94.4  --   --   --   --   --   --   PLT 237.0  --  244  --   --   --   --   --   --    < > = values in this interval not displayed.   Cardiac Enzymes: No results for input(s): CKTOTAL, CKMB, CKMBINDEX, TROPONINI in the last 168 hours. BNP: Invalid input(s): POCBNP CBG: Recent Labs  Lab 03/08/19 1548 03/08/19 2012 03/08/19 2352 03/09/19 0412 03/09/19 0756  GLUCAP 141* 131* 143* 101* 106*   D-Dimer No results for input(s): DDIMER in the last 72 hours. Hgb A1c No results for input(s): HGBA1C in the last 72 hours. Lipid Profile No results for input(s): CHOL, HDL, LDLCALC, TRIG, CHOLHDL, LDLDIRECT in the last 72 hours. Thyroid function studies No results for input(s): TSH, T4TOTAL, T3FREE, THYROIDAB in the last 72 hours.  Invalid input(s): FREET3 Anemia work up Recent Labs    03/06/19 2354  VITAMINB12 450  FOLATE 13.2  RETICCTPCT 3.2*   Urinalysis    Component Value Date/Time   COLORURINE YELLOW 12/15/2018 1058   APPEARANCEUR CLEAR 12/15/2018 1058   LABSPEC 1.015 12/15/2018 1058   PHURINE 6.0 12/15/2018 1058   GLUCOSEU NEGATIVE 12/15/2018 1058   HGBUR NEGATIVE 12/15/2018 1058   BILIRUBINUR NEGATIVE 12/15/2018 1058   KETONESUR NEGATIVE 12/15/2018 1058   UROBILINOGEN 0.2 12/15/2018 1058   NITRITE NEGATIVE  12/15/2018 1058   LEUKOCYTESUR TRACE (A) 12/15/2018 1058   Sepsis Labs Invalid input(s): PROCALCITONIN,  WBC,  LACTICIDVEN Microbiology Recent Results (from the past 240 hour(s))  SARS CORONAVIRUS 2 (TAT 6-24 HRS) Nasopharyngeal Nasopharyngeal Swab     Status: None   Collection Time: 03/07/19  2:15 AM   Specimen: Nasopharyngeal Swab  Result Value Ref Range Status   SARS Coronavirus 2 NEGATIVE NEGATIVE Final    Comment: (NOTE) SARS-CoV-2 target nucleic acids are NOT DETECTED. The SARS-CoV-2 RNA is generally detectable in upper and lower respiratory specimens during the acute phase of infection. Negative results do not preclude SARS-CoV-2 infection, do not rule out co-infections with other pathogens, and should not be used as the sole basis for treatment or other patient management decisions. Negative results must be combined with clinical observations, patient history, and epidemiological information. The expected result is Negative. Fact Sheet for Patients: SugarRoll.be Fact Sheet for Healthcare Providers: https://www.woods-mathews.com/ This test is not yet approved or cleared by the [place] FDA and  has been authorized for detection and/or diagnosis of SARS-CoV-2 by FDA under an Emergency Use Authorization (EUA). This EUA will remain  in effect (meaning this test can be used) for the duration of the COVID-19 declaration under Section 56 4(b)(1) of the Act, 21 U.S.C. section 360bbb-3(b)(1), unless the authorization is terminated or revoked sooner. Performed at [facility], [place] [address], [place], [place] [address]      Time coordinating discharge: Over 30 minutes  SIGNED:   Shivonne Schwartzman J [place] DO  [facility] 03/09/2019, 8:45 AM

## 2019-03-09 NOTE — Progress Notes (Signed)
Completed DC teaching with patient and she verbalized understanding. PIV removed. Patient has son on the way to pick her up at this time.

## 2019-03-11 ENCOUNTER — Encounter: Payer: Self-pay | Admitting: *Deleted

## 2019-03-12 ENCOUNTER — Telehealth: Payer: Self-pay | Admitting: Hematology and Oncology

## 2019-03-12 ENCOUNTER — Telehealth: Payer: Self-pay | Admitting: *Deleted

## 2019-03-12 NOTE — Telephone Encounter (Signed)
Unable to reach. 1st attempt.  

## 2019-03-12 NOTE — Telephone Encounter (Signed)
Scheduled appt per sch msg for 3/4. Called and spoke with patient. Confirmed appt

## 2019-03-12 NOTE — Telephone Encounter (Signed)
Received call from patient inquiring about an appointment for IV iron. And labs. Scheduling message sent

## 2019-03-13 NOTE — Telephone Encounter (Signed)
I have made two attempts and have been unable to reach patient. Pt has scheduled hospital follow up with PCP 03/15/19.

## 2019-03-15 ENCOUNTER — Other Ambulatory Visit: Payer: Self-pay

## 2019-03-15 ENCOUNTER — Inpatient Hospital Stay: Payer: Medicare HMO

## 2019-03-15 ENCOUNTER — Other Ambulatory Visit: Payer: Self-pay | Admitting: Hematology and Oncology

## 2019-03-15 ENCOUNTER — Inpatient Hospital Stay: Payer: Medicare HMO | Attending: Hematology and Oncology

## 2019-03-15 ENCOUNTER — Inpatient Hospital Stay: Payer: Medicare HMO | Admitting: Family Medicine

## 2019-03-15 VITALS — BP 102/62 | HR 75 | Temp 97.6°F | Resp 16

## 2019-03-15 DIAGNOSIS — D500 Iron deficiency anemia secondary to blood loss (chronic): Secondary | ICD-10-CM

## 2019-03-15 DIAGNOSIS — D509 Iron deficiency anemia, unspecified: Secondary | ICD-10-CM | POA: Diagnosis not present

## 2019-03-15 LAB — CBC WITH DIFFERENTIAL (CANCER CENTER ONLY)
Abs Immature Granulocytes: 0.03 10*3/uL (ref 0.00–0.07)
Basophils Absolute: 0.1 10*3/uL (ref 0.0–0.1)
Basophils Relative: 1 %
Eosinophils Absolute: 0.3 10*3/uL (ref 0.0–0.5)
Eosinophils Relative: 4 %
HCT: 33.2 % — ABNORMAL LOW (ref 36.0–46.0)
Hemoglobin: 10.7 g/dL — ABNORMAL LOW (ref 12.0–15.0)
Immature Granulocytes: 0 %
Lymphocytes Relative: 38 %
Lymphs Abs: 2.6 10*3/uL (ref 0.7–4.0)
MCH: 29.6 pg (ref 26.0–34.0)
MCHC: 32.2 g/dL (ref 30.0–36.0)
MCV: 92 fL (ref 80.0–100.0)
Monocytes Absolute: 0.6 10*3/uL (ref 0.1–1.0)
Monocytes Relative: 8 %
Neutro Abs: 3.3 10*3/uL (ref 1.7–7.7)
Neutrophils Relative %: 49 %
Platelet Count: 238 10*3/uL (ref 150–400)
RBC: 3.61 MIL/uL — ABNORMAL LOW (ref 3.87–5.11)
RDW: 14.6 % (ref 11.5–15.5)
WBC Count: 6.9 10*3/uL (ref 4.0–10.5)
nRBC: 0 % (ref 0.0–0.2)

## 2019-03-15 MED ORDER — SODIUM CHLORIDE 0.9 % IV SOLN
510.0000 mg | Freq: Once | INTRAVENOUS | Status: AC
  Administered 2019-03-15: 510 mg via INTRAVENOUS
  Filled 2019-03-15: qty 510

## 2019-03-15 MED ORDER — SODIUM CHLORIDE 0.9 % IV SOLN
Freq: Once | INTRAVENOUS | Status: AC
  Filled 2019-03-15: qty 250

## 2019-03-15 NOTE — Patient Instructions (Signed)
Ferumoxytol injection What is this medicine? FERUMOXYTOL is an iron complex. Iron is used to make healthy red blood cells, which carry oxygen and nutrients throughout the body. This medicine is used to treat iron deficiency anemia. This medicine may be used for other purposes; ask your health care provider or pharmacist if you have questions. COMMON BRAND NAME(S): Feraheme What should I tell my health care provider before I take this medicine? They need to know if you have any of these conditions:  anemia not caused by low iron levels  high levels of iron in the blood  magnetic resonance imaging (MRI) test scheduled  an unusual or allergic reaction to iron, other medicines, foods, dyes, or preservatives  pregnant or trying to get pregnant  breast-feeding How should I use this medicine? This medicine is for injection into a vein. It is given by a health care professional in a hospital or clinic setting. Talk to your pediatrician regarding the use of this medicine in children. Special care may be needed. Overdosage: If you think you have taken too much of this medicine contact a poison control center or emergency room at once. NOTE: This medicine is only for you. Do not share this medicine with others. What if I miss a dose? It is important not to miss your dose. Call your doctor or health care professional if you are unable to keep an appointment. What may interact with this medicine? This medicine may interact with the following medications:  other iron products This list may not describe all possible interactions. Give your health care provider a list of all the medicines, herbs, non-prescription drugs, or dietary supplements you use. Also tell them if you smoke, drink alcohol, or use illegal drugs. Some items may interact with your medicine. What should I watch for while using this medicine? Visit your doctor or healthcare professional regularly. Tell your doctor or healthcare  professional if your symptoms do not start to get better or if they get worse. You may need blood work done while you are taking this medicine. You may need to follow a special diet. Talk to your doctor. Foods that contain iron include: whole grains/cereals, dried fruits, beans, or peas, leafy green vegetables, and organ meats (liver, kidney). What side effects may I notice from receiving this medicine? Side effects that you should report to your doctor or health care professional as soon as possible:  allergic reactions like skin rash, itching or hives, swelling of the face, lips, or tongue  breathing problems  changes in blood pressure  feeling faint or lightheaded, falls  fever or chills  flushing, sweating, or hot feelings  swelling of the ankles or feet Side effects that usually do not require medical attention (report to your doctor or health care professional if they continue or are bothersome):  diarrhea  headache  nausea, vomiting  stomach pain This list may not describe all possible side effects. Call your doctor for medical advice about side effects. You may report side effects to FDA at 1-800-FDA-1088. Where should I keep my medicine? This drug is given in a hospital or clinic and will not be stored at home. NOTE: This sheet is a summary. It may not cover all possible information. If you have questions about this medicine, talk to your doctor, pharmacist, or health care provider.  2020 Elsevier/Gold Standard (2016-02-16 20:21:10) Coronavirus (COVID-19) Are you at risk?  Are you at risk for the Coronavirus (COVID-19)?  To be considered HIGH RISK for Coronavirus (COVID-19),   you have to meet the following criteria:  . Traveled to [place], [place], [place], [place] or [place]; or in the [place] to [place], [place], [place], or [place]; and have fever, cough, and shortness of breath within the last 2 weeks of travel OR . Been in close contact with a person  diagnosed with COVID-19 within the last 2 weeks and have fever, cough, and shortness of breath . IF YOU DO NOT MEET THESE CRITERIA, YOU ARE CONSIDERED LOW RISK FOR COVID-19.  What to do if you are HIGH RISK for COVID-19?  . If you are having a medical emergency, call 911. . Seek medical care right away. Before you go to a doctor's office, urgent care or emergency department, call ahead and tell them about your recent travel, contact with someone diagnosed with COVID-19, and your symptoms. You should receive instructions from your physician's office regarding next steps of care.  . When you arrive at healthcare provider, tell the healthcare staff immediately you have returned from visiting [place], [place], [place], [place] or [place]; or traveled in the [place] to [place], [place], [place], or [place]; in the last two weeks or you have been in close contact with a person diagnosed with COVID-19 in the last 2 weeks.   . Tell the health care staff about your symptoms: fever, cough and shortness of breath. . After you have been seen by a medical provider, you will be either: o Tested for (COVID-19) and discharged home on quarantine except to seek medical care if symptoms worsen, and asked to  - Stay home and avoid contact with others until you get your results (4-5 days)  - Avoid travel on public transportation if possible (such as bus, train, or airplane) or o Sent to the Emergency Department by EMS for evaluation, COVID-19 testing, and possible admission depending on your condition and test results.  What to do if you are LOW RISK for COVID-19?  Reduce your risk of any infection by using the same precautions used for avoiding the common cold or flu:  . Wash your hands often with soap and warm water for at least 20 seconds.  If soap and water are not readily available, use an alcohol-based hand sanitizer with at least 60% alcohol.  . If coughing or sneezing, cover your mouth and nose by  coughing or sneezing into the elbow areas of your shirt or coat, into a tissue or into your sleeve (not your hands). . Avoid shaking hands with others and consider head nods or verbal greetings only. . Avoid touching your eyes, nose, or mouth with unwashed hands.  . Avoid close contact with people who are sick. . Avoid places or events with large numbers of people in one location, like concerts or sporting events. . Carefully consider travel plans you have or are making. . If you are planning any travel outside or inside the [place], visit the CDC's Travelers' Health webpage for the latest health notices. . If you have some symptoms but not all symptoms, continue to monitor at home and seek medical attention if your symptoms worsen. . If you are having a medical emergency, call 911.   ADDITIONAL HEALTHCARE OPTIONS FOR PATIENTS   Telehealth / e-Visit: [email or web address]         [facility]: [phone]  [facility]: [phone]                   [facility]: [phone]   

## 2019-03-19 ENCOUNTER — Telehealth: Payer: Self-pay | Admitting: Hematology and Oncology

## 2019-03-19 NOTE — Telephone Encounter (Signed)
Scheduled per 3/4 los. Called and spoke with patient. Confirmed appt

## 2019-03-20 ENCOUNTER — Ambulatory Visit (INDEPENDENT_AMBULATORY_CARE_PROVIDER_SITE_OTHER): Payer: Medicare HMO | Admitting: Family Medicine

## 2019-03-20 ENCOUNTER — Encounter: Payer: Self-pay | Admitting: Family Medicine

## 2019-03-20 ENCOUNTER — Other Ambulatory Visit: Payer: Self-pay

## 2019-03-20 VITALS — BP 102/64 | HR 83 | Temp 97.9°F | Ht 62.0 in | Wt 202.4 lb

## 2019-03-20 DIAGNOSIS — I9589 Other hypotension: Secondary | ICD-10-CM | POA: Diagnosis not present

## 2019-03-20 DIAGNOSIS — H8309 Labyrinthitis, unspecified ear: Secondary | ICD-10-CM | POA: Diagnosis not present

## 2019-03-20 DIAGNOSIS — I959 Hypotension, unspecified: Secondary | ICD-10-CM | POA: Insufficient documentation

## 2019-03-20 MED ORDER — MECLIZINE HCL 25 MG PO TABS: 25.0000 mg | ORAL_TABLET | Freq: Three times a day (TID) | ORAL | 0 refills | Status: DC | PRN

## 2019-03-20 NOTE — Patient Instructions (Signed)
Labyrinthitis  Labyrinthitis is an infection of the inner ear. Your inner ear is made up of tubes and canals (labyrinth). These are filled with fluid. There are nerve cells in your inner ear that send hearing and balance signals to your brain. When germs get inside the tubes and canals, they harm the nerve cells that send signals to the brain. This condition is often caused by a virus. You may have:  Dizziness.  Ringing in the ears.  Loss of hearing.  Loss of balance.  Stomach upset.  Throwing up. Follow these instructions at home: Medicines   Take over-the-counter and prescription medicines only as told by your doctor.  If you were given an antibiotic medicine, take it as told by your doctor. Do not stop taking the antibiotic even if you start to feel better. Activity  Rest as told by your doctor.  Limit the things you do as told. Ask your doctor what is safe for you to do.  Do not make any sudden movements until you no longer feel dizzy.  Do physical therapy as told by your doctor. General instructions  Avoid loud noises and bright lights.  Do not drive until your doctor says that it is safe for you.  Drink enough fluid to keep your pee (urine) pale yellow.  Keep all follow-up visits as told by your doctor. This is important. Contact a doctor if:  Your symptoms do not get better.  You do not get better after two weeks.  You have a fever. Get help right away if:  You are very dizzy.  You keep throwing up.  You keep feeling sick to your stomach.  Your hearing gets much worse all of a sudden. Summary  Labyrinthitis is an infection of the inner ear.  This condition is often caused by a virus. Symptoms include dizziness, hearing loss, and ringing in the ears.  Treatment depends on the cause. Follow what your doctor tells you. This information is not intended to replace advice given to you by your health care provider. Make sure you discuss any questions  you have with your health care provider. Document Revised: 10/17/2017 Document Reviewed: 01/08/2017 Elsevier Patient Education  2020 Elsevier Inc.  

## 2019-03-20 NOTE — Progress Notes (Signed)
Established Patient Office Visit  Subjective:  Patient ID: Dana Nielsen, female    DOB: Apr 15, 1945  Age: 74 y.o. MRN: [id number]  CC:  Chief Complaint  Patient presents with  . Hospitalization Follow-up    follow up on small intestine repai, c/o dizzy spells pt states that she is not sure if BP mes are causing this.     HPI Herberta Pickron presents for follow-up of recent hospitalization for iron deficiency anemia.  Her stool was heme positive just before admission.  Upper endoscopy did reveal a possible bleeding source in her distal duodenum.  She received iron infusions and her hemoglobin has remained stable upon chart review.  Follow-up is scheduled with hematology next week.  She has been lightheaded since her discharge.  She is also experienced a spinning sensation with change in head position.  She has not taken her Lasix or losartan today.  Past Medical History:  Diagnosis Date  . Diabetes mellitus type 2, diet-controlled ([place])   . HLD (hyperlipidemia)   . Hypothyroidism   . Iron deficiency anemia     Past Surgical History:  Procedure Laterality Date  . arthroscopic knee    . BIOPSY  03/08/2019   Procedure: BIOPSY;  Surgeon: Lavena Bullion, DO;  Location: [facility];  Service: Gastroenterology;;  . C sections     x 7  . ENTEROSCOPY N/A 03/08/2019   Procedure: ENTEROSCOPY;  Surgeon: Lavena Bullion, DO;  Location: [facility];  Service: Gastroenterology;  Laterality: N/A;  . HEMOSTASIS CLIP PLACEMENT  03/08/2019   Procedure: HEMOSTASIS CLIP PLACEMENT;  Surgeon: Lavena Bullion, DO;  Location: [facility];  Service: Gastroenterology;;  . Lia Foyer TATTOO INJECTION  03/08/2019   Procedure: SUBMUCOSAL TATTOO INJECTION;  Surgeon: Lavena Bullion, DO;  Location: [facility];  Service: Gastroenterology;;  . TONSILLECTOMY    . WISDOM TOOTH EXTRACTION    . WRIST SURGERY      Family History  Problem Relation Age of Onset  . Multiple myeloma Mother   .  Heart attack Father   . Lung cancer Sister     Social History   Socioeconomic History  . Marital status: Widowed    Spouse name: Not on file  . Number of children: 75  . Years of education: Not on file  . Highest education level: Not on file  Occupational History  . Not on file  Tobacco Use  . Smoking status: Never Smoker  . Smokeless tobacco: Never Used  Substance and Sexual Activity  . Alcohol use: Yes    Comment: Rarely  . Drug use: Never  . Sexual activity: Not on file  Other Topics Concern  . Not on file  Social History Narrative  . Not on file   Social Determinants of Health   Financial Resource Strain:   . Difficulty of Paying Living Expenses: Not on file  Food Insecurity:   . Worried About Charity fundraiser in the Last Year: Not on file  . Ran Out of Food in the Last Year: Not on file  Transportation Needs:   . Lack of Transportation (Medical): Not on file  . Lack of Transportation (Non-Medical): Not on file  Physical Activity:   . Days of Exercise per Week: Not on file  . Minutes of Exercise per Session: Not on file  Stress:   . Feeling of Stress : Not on file  Social Connections:   . Frequency of Communication with Friends and Family: Not on file  .  Frequency of Social Gatherings with Friends and Family: Not on file  . Attends Religious Services: Not on file  . Active Member of Clubs or Organizations: Not on file  . Attends Archivist Meetings: Not on file  . Marital Status: Not on file  Intimate Partner Violence:   . Fear of Current or Ex-Partner: Not on file  . Emotionally Abused: Not on file  . Physically Abused: Not on file  . Sexually Abused: Not on file    Outpatient Medications Prior to Visit  Medication Sig Dispense Refill  . ACCU-CHEK AVIVA PLUS test strip     . cetirizine (ZYRTEC) 10 MG tablet Take 10 mg by mouth daily.    . furosemide (LASIX) 20 MG tablet Take 20 mg by mouth daily.    Marland Kitchen losartan (COZAAR) 25 MG tablet Take 25  mg by mouth daily.    . metFORMIN (GLUCOPHAGE) 1000 MG tablet Take 1,000 mg by mouth 2 (two) times daily with a meal.    . pantoprazole (PROTONIX) 40 MG tablet Take 1 tablet (40 mg total) by mouth 2 (two) times daily. 112 tablet 0  . simvastatin (ZOCOR) 40 MG tablet Take 40 mg by mouth daily.    . sucralfate (CARAFATE) 1 g tablet Take 1 tablet (1 g total) by mouth 4 (four) times daily. Dissolved in 15ML water 120 tablet 0  . SYNTHROID 100 MCG tablet Take 100 mcg by mouth daily before breakfast.    . traZODone (DESYREL) 100 MG tablet Take 1 tablet (100 mg total) by mouth at bedtime. 90 tablet 1  . venlafaxine (EFFEXOR) 75 MG tablet Take 75 mg by mouth daily.     . sucralfate (CARAFATE) 1 GM/10ML suspension Take 10 mLs (1 g total) by mouth 4 (four) times daily -  with meals and at bedtime for 28 days. 1120 mL 0   No facility-administered medications prior to visit.    Allergies  Allergen Reactions  . Lidocaine Shortness Of Breath    ROS Review of Systems  Constitutional: Negative for fatigue, fever and unexpected weight change.  HENT: Negative.   Eyes: Negative for photophobia and visual disturbance.  Respiratory: Negative.   Cardiovascular: Negative.   Endocrine: Negative for polyphagia and polyuria.  Genitourinary: Negative for difficulty urinating, frequency and urgency.  Skin: Negative for pallor and rash.  Allergic/Immunologic: Negative for immunocompromised state.  Neurological: Positive for dizziness and light-headedness.  Hematological: Does not bruise/bleed easily.  Psychiatric/Behavioral: Negative.       Objective:    Physical Exam  Constitutional: She is oriented to person, place, and time. She appears well-developed and well-nourished. No distress.  HENT:  Head: Normocephalic and atraumatic.  Right Ear: External ear normal.  Left Ear: External ear normal.  Eyes: Conjunctivae are normal. Right eye exhibits no discharge. Left eye exhibits no discharge. No scleral  icterus.  Neck: No JVD present. No tracheal deviation present.  Cardiovascular: Normal rate, regular rhythm and normal heart sounds.  Pulmonary/Chest: Effort normal and breath sounds normal. No stridor.  Musculoskeletal:        General: No edema.  Neurological: She is alert and oriented to person, place, and time.  Skin: Skin is warm and dry. She is not diaphoretic.  Psychiatric: She has a normal mood and affect. Her behavior is normal.    BP 102/64   Pulse 83   Temp 97.9 F (36.6 C) (Tympanic)   Ht '5\' 2"'  (1.575 m)   Wt 202 lb 6.4 oz (91.8 kg)  SpO2 96%   BMI 37.02 kg/m  Wt Readings from Last 3 Encounters:  03/20/19 202 lb 6.4 oz (91.8 kg)  03/08/19 200 lb (90.7 kg)  03/05/19 203 lb 6.4 oz (92.3 kg)     Health Maintenance Due  Topic Date Due  . Hepatitis C Screening  06/13/45  . FOOT EXAM  01/13/1955  . OPHTHALMOLOGY EXAM  01/13/1955  . TETANUS/TDAP  01/13/1964  . MAMMOGRAM  01/13/1995  . COLONOSCOPY  01/13/1995  . DEXA SCAN  01/12/2010  . PNA vac Low Risk Adult (1 of 2 - PCV13) 01/12/2010    There are no preventive care reminders to display for this patient.  Lab Results  Component Value Date   TSH 3.59 12/15/2018   Lab Results  Component Value Date   WBC 6.9 03/15/2019   HGB 10.7 (L) 03/15/2019   HCT 33.2 (L) 03/15/2019   MCV 92.0 03/15/2019   PLT 238 03/15/2019   Lab Results  Component Value Date   NA 141 03/06/2019   K 3.7 03/06/2019   CO2 27 03/06/2019   GLUCOSE 141 (H) 03/06/2019   BUN 20 03/06/2019   CREATININE 0.82 03/06/2019   BILITOT 0.3 03/06/2019   ALKPHOS 53 03/06/2019   AST 19 03/06/2019   ALT 22 03/06/2019   PROT 7.3 03/06/2019   ALBUMIN 4.3 03/06/2019   CALCIUM 9.6 03/06/2019   ANIONGAP 8 03/06/2019   GFR 66.29 12/15/2018   Lab Results  Component Value Date   CHOL 127 12/15/2018   Lab Results  Component Value Date   HDL 35.90 (L) 12/15/2018   Lab Results  Component Value Date   LDLCALC 69 12/15/2018   Lab Results    Component Value Date   TRIG 110.0 12/15/2018   Lab Results  Component Value Date   CHOLHDL 4 12/15/2018   Lab Results  Component Value Date   HGBA1C 6.3 12/15/2018      Assessment & Plan:   Problem List Items Addressed This Visit      Cardiovascular and Mediastinum   Arterial hypotension - Primary     Nervous and Auditory   Labyrinthitis   Relevant Medications   meclizine (ANTIVERT) 25 MG tablet      Meds ordered this encounter  Medications  . meclizine (ANTIVERT) 25 MG tablet    Sig: Take 1 tablet (25 mg total) by mouth 3 (three) times daily as needed for dizziness.    Dispense:  30 tablet    Refill:  0    Follow-up: Return in about 2 weeks (around 04/03/2019), or hold lasix and losartan. Hydrate well..  Patient will hold her Lasix and losartan.  She will hydrate well.  She will use the meclizine as needed for the spinning sensation.  Follow-up with hematology for iron deficiency anemia.  Libby Maw, MD

## 2019-03-28 ENCOUNTER — Other Ambulatory Visit: Payer: Self-pay

## 2019-03-28 ENCOUNTER — Inpatient Hospital Stay: Payer: Medicare HMO

## 2019-03-28 ENCOUNTER — Encounter: Payer: Self-pay | Admitting: Hematology and Oncology

## 2019-03-28 DIAGNOSIS — D500 Iron deficiency anemia secondary to blood loss (chronic): Secondary | ICD-10-CM

## 2019-03-28 DIAGNOSIS — D509 Iron deficiency anemia, unspecified: Secondary | ICD-10-CM | POA: Diagnosis not present

## 2019-03-28 LAB — IRON AND TIBC
Iron: 64 ug/dL (ref 41–142)
Saturation Ratios: 22 % (ref 21–57)
TIBC: 298 ug/dL (ref 236–444)
UIBC: 234 ug/dL (ref 120–384)

## 2019-03-28 LAB — CBC WITH DIFFERENTIAL (CANCER CENTER ONLY)
Abs Immature Granulocytes: 0.04 10*3/uL (ref 0.00–0.07)
Basophils Absolute: 0.1 10*3/uL (ref 0.0–0.1)
Basophils Relative: 1 %
Eosinophils Absolute: 0.2 10*3/uL (ref 0.0–0.5)
Eosinophils Relative: 4 %
HCT: 34.2 % — ABNORMAL LOW (ref 36.0–46.0)
Hemoglobin: 10.5 g/dL — ABNORMAL LOW (ref 12.0–15.0)
Immature Granulocytes: 1 %
Lymphocytes Relative: 26 %
Lymphs Abs: 1.4 10*3/uL (ref 0.7–4.0)
MCH: 29 pg (ref 26.0–34.0)
MCHC: 30.7 g/dL (ref 30.0–36.0)
MCV: 94.5 fL (ref 80.0–100.0)
Monocytes Absolute: 0.5 10*3/uL (ref 0.1–1.0)
Monocytes Relative: 10 %
Neutro Abs: 3.1 10*3/uL (ref 1.7–7.7)
Neutrophils Relative %: 58 %
Platelet Count: 183 10*3/uL (ref 150–400)
RBC: 3.62 MIL/uL — ABNORMAL LOW (ref 3.87–5.11)
RDW: 14.7 % (ref 11.5–15.5)
WBC Count: 5.2 10*3/uL (ref 4.0–10.5)
nRBC: 0 % (ref 0.0–0.2)

## 2019-03-28 LAB — CMP (CANCER CENTER ONLY)
ALT: 16 U/L (ref 0–44)
AST: 14 U/L — ABNORMAL LOW (ref 15–41)
Albumin: 3.6 g/dL (ref 3.5–5.0)
Alkaline Phosphatase: 60 U/L (ref 38–126)
Anion gap: 10 (ref 5–15)
BUN: 14 mg/dL (ref 8–23)
CO2: 29 mmol/L (ref 22–32)
Calcium: 9.2 mg/dL (ref 8.9–10.3)
Chloride: 103 mmol/L (ref 98–111)
Creatinine: 0.77 mg/dL (ref 0.44–1.00)
GFR, Est AFR Am: >60 mL/min (ref >60)
GFR, Estimated: >60 mL/min (ref >60)
Glucose, Bld: 128 mg/dL — ABNORMAL HIGH (ref 70–99)
Potassium: 3.8 mmol/L (ref 3.5–5.1)
Sodium: 142 mmol/L (ref 135–145)
Total Bilirubin: 0.4 mg/dL (ref 0.3–1.2)
Total Protein: 6.6 g/dL (ref 6.5–8.1)

## 2019-03-28 LAB — FERRITIN: Ferritin: 849 ng/mL — ABNORMAL HIGH (ref 11–307)

## 2019-03-29 ENCOUNTER — Inpatient Hospital Stay: Payer: Medicare HMO

## 2019-04-02 ENCOUNTER — Inpatient Hospital Stay: Payer: Medicare HMO | Admitting: Family Medicine

## 2019-04-03 ENCOUNTER — Encounter: Payer: Self-pay | Admitting: Gastroenterology

## 2019-04-03 ENCOUNTER — Ambulatory Visit: Payer: Medicare HMO | Admitting: Gastroenterology

## 2019-04-03 ENCOUNTER — Other Ambulatory Visit: Payer: Self-pay

## 2019-04-03 VITALS — BP 118/66 | HR 93 | Temp 96.8°F | Ht 62.0 in | Wt 202.4 lb

## 2019-04-03 DIAGNOSIS — K3182 Dieulafoy lesion (hemorrhagic) of stomach and duodenum: Secondary | ICD-10-CM

## 2019-04-03 DIAGNOSIS — K219 Gastro-esophageal reflux disease without esophagitis: Secondary | ICD-10-CM | POA: Diagnosis not present

## 2019-04-03 DIAGNOSIS — D509 Iron deficiency anemia, unspecified: Secondary | ICD-10-CM | POA: Diagnosis not present

## 2019-04-03 DIAGNOSIS — K5900 Constipation, unspecified: Secondary | ICD-10-CM

## 2019-04-03 NOTE — Patient Instructions (Signed)
Return in 3 months  It was a pleasure to see you today!  Vito Cirigliano, D.O.

## 2019-04-03 NOTE — Progress Notes (Signed)
Chief Complaint: Hospital follow-up  GI history: 74 year old female Dana Nielsen with history of diabetes, hyperlipidemia, hypothyroidism, IDA, admitted 2/23-26 with fatigue and melena x2 weeks with admission hemoglobin 10.2 (baseline ~12.5), ferritin 236, iron 50, TIBC 348, sat 14%.  Normal B12/folate.  Inpatient enteroscopy on 03/08/2019: 2 cm HH, 10 mm cratered gastric antral ulcer (clips x2), moderate gastritis with erosions (biopsies negative for H. pylori), residual food in stomach, active oozing Dieulafoy lesion in third portion of duodenum (clips x1; tattoo placed 1-2 cm distal to the lesion for future identification).  Was treated with IV iron infusion, high-dose PPI, sucralfate and discharged the following day.   She has prior history of obscure GI bleeding and has undergone extensive prior GI evaluation in [place].  Also with a history of melena and IDA in the past, followed regularly with Hematology in [place], treated with iron infusions prior to relocating to [place].  Last iron infusion prior to admission was 08/2017.  By her report, previous evaluation for GI bleed in [place], [place] includes EGD, colonoscopy, and VCE, which were unrevealing per patient.  -Abdominal ultrasound (03/08/2019): 9 mm polyp in the fundus of GB, normal CBD, fatty liver   HPI:    Dana Nielsen is a 74 y.o. female presenting to the Gastroenterology Clinic for follow-up after recent admission for upper GI bleed as outlined above.  Discharge hemoglobin 10.3.  Was seen by Dr. Lorenso Courier in the Hematology clinic, given a 2nd IV iron infusion, and scheudled for f/u again in 2 weeks.  Repeat labs on 03/31/2019: H/H 10.5/34.2, MCV/RDW 94/14.7.  Normal CMP.  Ferritin 849, iron 64, TIBC 228, sat 22%.  Planning on repeat CBC next week, with possible repeat IV iron infusion pending results.  Today, she states she is still fatigued.  No significant increase in hemoglobin since  hospital discharge, but no overt GI blood loss.  Still taking Carafate and Protonix as prescribed.  Takes an OTC "natural" iron supplement-Floradix.  Was seen by her PCM on 03/20/2019; prescribed Antivert for labyrinthitis holding Lasix/losartan with close follow-up in 2 weeks (she has since restarted antihypertensive meds).   Past Medical History:  Diagnosis Date  . Diabetes mellitus type 2, diet-controlled ([place])   . HLD (hyperlipidemia)   . Hypothyroidism   . Iron deficiency anemia      Past Surgical History:  Procedure Laterality Date  . arthroscopic knee    . BIOPSY  03/08/2019   Procedure: BIOPSY;  Surgeon: Lavena Bullion, DO;  [facility];  Service: Gastroenterology;;  . C sections     x 7  . COLONOSCOPY  2019   x2 At the [facility] and in [place]  . ENTEROSCOPY N/A 03/08/2019   Procedure: ENTEROSCOPY;  Surgeon: Lavena Bullion, DO;  [facility];  Service: Gastroenterology;  Laterality: N/A;  . HEMOSTASIS CLIP PLACEMENT  03/08/2019   Procedure: HEMOSTASIS CLIP PLACEMENT;  Surgeon: Lavena Bullion, DO;  [facility];  Service: Gastroenterology;;  . Lia Foyer TATTOO INJECTION  03/08/2019   Procedure: SUBMUCOSAL TATTOO INJECTION;  Surgeon: Lavena Bullion, DO;  [facility];  Service: Gastroenterology;;  . TONSILLECTOMY    . WISDOM TOOTH EXTRACTION    . WRIST SURGERY     Family History  Problem Relation Age of Onset  . Multiple myeloma Mother   . Heart attack Father   . Lung cancer Sister    Social History  Tobacco Use  . Smoking status: Never Smoker  . Smokeless tobacco: Never Used  Substance Use Topics  . Alcohol use: Yes    Comment: Rarely  . Drug use: Never   Current Outpatient Medications  Medication Sig Dispense Refill  . ACCU-CHEK AVIVA PLUS test strip     . cetirizine (ZYRTEC) 10 MG tablet Take 10 mg by mouth daily.    . furosemide (LASIX) 20 MG tablet Take 20 mg by mouth daily.    Marland Kitchen losartan  (COZAAR) 25 MG tablet Take 25 mg by mouth daily.    . metFORMIN (GLUCOPHAGE) 1000 MG tablet Take 1,000 mg by mouth 2 (two) times daily with a meal.    . pantoprazole (PROTONIX) 40 MG tablet Take 1 tablet (40 mg total) by mouth 2 (two) times daily. 112 tablet 0  . simvastatin (ZOCOR) 40 MG tablet Take 40 mg by mouth daily.    . sucralfate (CARAFATE) 1 g tablet Take 1 tablet (1 g total) by mouth 4 (four) times daily. Dissolved in 15ML water 120 tablet 0  . SYNTHROID 100 MCG tablet Take 100 mcg by mouth daily before breakfast.    . traZODone (DESYREL) 100 MG tablet Take 1 tablet (100 mg total) by mouth at bedtime. 90 tablet 1  . venlafaxine (EFFEXOR) 75 MG tablet Take 75 mg by mouth daily.      No current facility-administered medications for this visit.   Allergies  Allergen Reactions  . Lidocaine Shortness Of Breath     Review of Systems: All systems reviewed and negative except where noted in HPI.     Physical Exam:    Wt Readings from Last 3 Encounters:  04/03/19 202 lb 6 oz (91.8 kg)  03/20/19 202 lb 6.4 oz (91.8 kg)  03/08/19 200 lb (90.7 kg)    BP 118/66   Pulse 93   Temp (!) 96.8 F (36 C)   Ht '5\' 2"'  (1.575 m)   Wt 202 lb 6 oz (91.8 kg)   BMI 37.01 kg/m  Constitutional:  [place], in no acute distress. Psychiatric: Normal mood and affect. Behavior is normal. EENT: Pupils normal.  Conjunctivae are normal. No scleral icterus. Neck supple. No cervical LAD. Cardiovascular: Normal rate, regular rhythm. No edema Pulmonary/chest: Effort normal and breath sounds normal. No wheezing, rales or rhonchi. Abdominal: Soft, nondistended, nontender. Bowel sounds active throughout. There are no masses palpable. No hepatomegaly. Neurological: Alert and oriented to person place and time. Skin: Skin is warm and dry. No rashes noted.   ASSESSMENT AND PLAN;   1) Dieulafoy lesion s/p endoscopic intervention 2) PUD s/p endoscopic intervention 3) IDA  -No further overt GI blood  loss -Follows closely with Dr. Lorenso Courier in the Hematology clinic, with plan for repeat blood counts next week, with repeat iron infusion as appropriate -Takes OTC natural iron supplement.  Not sure how much iron-containing supplement -Resume PPI BID x8 weeks total, then back to daily for reflux control and gastric prophylaxis  4) GERD: -Well-controlled on current PPI therapy.  We will plan to titrate as above  5) History of constipation: -History of constipation, but bowel habits well controlled, with daily, soft stools without straining to have BM -Continue current adequate hydration and high-fiber diet -Clinical improvement could also be 2/2 beneficial side effect of high-dose PPI therapy.  Can monitor for change with titration as above - If constipation returns, Miralax and titrate to effect - Get old records. Check CRC screening status at f/u  RTC in 3-4  months or sooner as needed  I spent 35 minutes of time, including in depth chart review, independent review of results as outlined above, communicating results with the patient directly, face-to-face time with the patient, coordinating care, and ordering studies and medications as appropriate, and documentation.    Lavena Bullion, DO, FACG  04/03/2019, 9:07 AM   Libby Maw,*

## 2019-04-05 ENCOUNTER — Telehealth: Payer: Self-pay | Admitting: Family Medicine

## 2019-04-05 NOTE — Telephone Encounter (Signed)
-----   Message from [place], DO sent at 04/03/2019  1:10 PM EDT ----- Regarding: PCM change request This is a patient that I saw as an inpatient recently and in follow-up today.  She has been seen by Dr. Ethelene Hal after moving from [facility] to [place], but requesting a change in her PCM.  She says she talked to your front desk about changing to you, but they said you are not accepting new patients.  I told her I could look into it, but explained that this very well may be the case since you have absorbed multiple panels.  She would like to be seen by you, but if is not possible, I can set her up with one of the other [place] offices.  No pressure, your call either way. Marland Kitchen

## 2019-04-05 NOTE — Telephone Encounter (Signed)
Please reach out to pt to confirm she would like to switch PCP from Dr. Ethelene Hal to me. If she does and if ok with Dr. Ethelene Hal, I'm fine to add this patient to my panel

## 2019-04-05 NOTE — Telephone Encounter (Signed)
Can you please call this patient to discuss transfer of care? Dana Nielsen spoke with her GI physician and apparently would like to switch from Dr. Ethelene Hal to me. This is ok with me if it is ok with PCP and patient.

## 2019-04-06 ENCOUNTER — Encounter: Payer: Self-pay | Admitting: Family Medicine

## 2019-04-06 ENCOUNTER — Ambulatory Visit (INDEPENDENT_AMBULATORY_CARE_PROVIDER_SITE_OTHER): Payer: Medicare HMO | Admitting: Family Medicine

## 2019-04-06 ENCOUNTER — Other Ambulatory Visit: Payer: Self-pay

## 2019-04-06 VITALS — BP 134/76 | HR 88 | Temp 98.7°F | Ht 62.0 in | Wt 202.4 lb

## 2019-04-06 DIAGNOSIS — E118 Type 2 diabetes mellitus with unspecified complications: Secondary | ICD-10-CM | POA: Diagnosis not present

## 2019-04-06 DIAGNOSIS — D509 Iron deficiency anemia, unspecified: Secondary | ICD-10-CM | POA: Diagnosis not present

## 2019-04-06 DIAGNOSIS — E039 Hypothyroidism, unspecified: Secondary | ICD-10-CM

## 2019-04-06 DIAGNOSIS — F5101 Primary insomnia: Secondary | ICD-10-CM

## 2019-04-06 DIAGNOSIS — J301 Allergic rhinitis due to pollen: Secondary | ICD-10-CM

## 2019-04-06 DIAGNOSIS — E7800 Pure hypercholesterolemia, unspecified: Secondary | ICD-10-CM

## 2019-04-06 LAB — HEMOGLOBIN AND HEMATOCRIT, BLOOD
HCT: 35.5 % — ABNORMAL LOW (ref 36.0–46.0)
Hemoglobin: 11.8 g/dL — ABNORMAL LOW (ref 12.0–15.0)

## 2019-04-06 MED ORDER — TRAZODONE HCL 100 MG PO TABS: 100.0000 mg | ORAL_TABLET | Freq: Every day | ORAL | 1 refills | Status: DC

## 2019-04-06 MED ORDER — SIMVASTATIN 40 MG PO TABS: 40.0000 mg | ORAL_TABLET | Freq: Every day | ORAL | 3 refills | Status: AC

## 2019-04-06 MED ORDER — CETIRIZINE HCL 10 MG PO TABS: 10.0000 mg | ORAL_TABLET | Freq: Every day | ORAL | 1 refills | Status: DC

## 2019-04-06 NOTE — Telephone Encounter (Signed)
I talked to pt today when she was in office and scheduled her a TOC, I did ask pt to see who had told her that Dr Bryan Lemma wasn't taking new patients and she said no one did so Im not sure where that came from, We always send a telephone note to providers about transferring

## 2019-04-06 NOTE — Progress Notes (Signed)
Established Patient Office Visit  Subjective:  Patient ID: Dana Nielsen, female    DOB: Jan 22, 1945  Age: 74 y.o. MRN: [id number]  CC:  Chief Complaint  Patient presents with  . Follow-up    2 week follow up refill on medications/refill on medications.     HPI Dana Nielsen presents for follow-up of her hypertension.  Blood pressure has stabilized she is no longer lightheaded.  She has restarted her Lasix and low-dose losartan.  Blood pressure at home is running a little bit higher.  She is concerned that her hemoglobin has not increased.  She has follow-up with hematology on April 8.  Request that it be rechecked today.  Continue Zyrtec for allergy rhinitis.  Request refills on her trazodone for sleep.  Past Medical History:  Diagnosis Date  . Diabetes mellitus type 2, diet-controlled ([place])   . HLD (hyperlipidemia)   . Hypothyroidism   . Iron deficiency anemia     Past Surgical History:  Procedure Laterality Date  . arthroscopic knee    . BIOPSY  03/08/2019   Procedure: BIOPSY;  Surgeon: Lavena Bullion, DO;  Location: [facility];  Service: Gastroenterology;;  . C sections     x 7  . COLONOSCOPY  2019   x2 At the [place] and in [place]  . ENTEROSCOPY N/A 03/08/2019   Procedure: ENTEROSCOPY;  Surgeon: Lavena Bullion, DO;  Location: [facility];  Service: Gastroenterology;  Laterality: N/A;  . HEMOSTASIS CLIP PLACEMENT  03/08/2019   Procedure: HEMOSTASIS CLIP PLACEMENT;  Surgeon: Lavena Bullion, DO;  [facility];  Service: Gastroenterology;;  . Lia Foyer TATTOO INJECTION  03/08/2019   Procedure: SUBMUCOSAL TATTOO INJECTION;  Surgeon: Lavena Bullion, DO;  [facility];  Service: Gastroenterology;;  . TONSILLECTOMY    . WISDOM TOOTH EXTRACTION    . WRIST SURGERY      Family History  Problem Relation Age of Onset  . Multiple myeloma Mother   . Heart attack Father   . Lung cancer Sister     Social History    Socioeconomic History  . Marital status: Widowed    Spouse name: Not on file  . Number of children: 27  . Years of education: Not on file  . Highest education level: Not on file  Occupational History  . Not on file  Tobacco Use  . Smoking status: Never Smoker  . Smokeless tobacco: Never Used  Substance and Sexual Activity  . Alcohol use: Yes    Comment: Rarely  . Drug use: Never  . Sexual activity: Not on file  Other Topics Concern  . Not on file  Social History Narrative  . Not on file   Social Determinants of Health   Financial Resource Strain:   . Difficulty of Paying Living Expenses:   Food Insecurity:   . Worried About Charity fundraiser in the Last Year:   . Arboriculturist in the Last Year:   Transportation Needs:   . Film/video editor (Medical):   Marland Kitchen Lack of Transportation (Non-Medical):   Physical Activity:   . Days of Exercise per Week:   . Minutes of Exercise per Session:   Stress:   . Feeling of Stress :   Social Connections:   . Frequency of Communication with Friends and Family:   . Frequency of Social Gatherings with Friends and Family:   . Attends Religious Services:   . Active Member of Clubs or Organizations:   .  Attends Archivist Meetings:   Marland Kitchen Marital Status:   Intimate Partner Violence:   . Fear of Current or Ex-Partner:   . Emotionally Abused:   Marland Kitchen Physically Abused:   . Sexually Abused:     Outpatient Medications Prior to Visit  Medication Sig Dispense Refill  . ACCU-CHEK AVIVA PLUS test strip     . furosemide (LASIX) 20 MG tablet Take 20 mg by mouth daily.    Marland Kitchen losartan (COZAAR) 25 MG tablet Take 25 mg by mouth daily.    . metFORMIN (GLUCOPHAGE) 1000 MG tablet Take 1,000 mg by mouth 2 (two) times daily with a meal.    . pantoprazole (PROTONIX) 40 MG tablet Take 1 tablet (40 mg total) by mouth 2 (two) times daily. 112 tablet 0  . sucralfate (CARAFATE) 1 g tablet Take 1 tablet (1 g total) by mouth 4 (four) times daily.  Dissolved in 15ML water 120 tablet 0  . SYNTHROID 100 MCG tablet Take 100 mcg by mouth daily before breakfast.    . venlafaxine (EFFEXOR) 75 MG tablet Take 75 mg by mouth daily.     . cetirizine (ZYRTEC) 10 MG tablet Take 10 mg by mouth daily.    . simvastatin (ZOCOR) 40 MG tablet Take 40 mg by mouth daily.    . traZODone (DESYREL) 100 MG tablet Take 1 tablet (100 mg total) by mouth at bedtime. 90 tablet 1   No facility-administered medications prior to visit.    Allergies  Allergen Reactions  . Lidocaine Shortness Of Breath    ROS Review of Systems  Constitutional: Negative.   Respiratory: Negative.   Cardiovascular: Negative.   Gastrointestinal: Negative.   Endocrine: Negative for polyphagia and polyuria.  Genitourinary: Negative.   Neurological: Negative for weakness and light-headedness.  Hematological: Does not bruise/bleed easily.  Psychiatric/Behavioral: Negative.       Objective:    Physical Exam  Constitutional: She is oriented to person, place, and time. She appears well-developed and well-nourished. No distress.  HENT:  Head: Normocephalic and atraumatic.  Right Ear: External ear normal.  Left Ear: External ear normal.  Eyes: Conjunctivae are normal. Right eye exhibits no discharge. Left eye exhibits no discharge. No scleral icterus.  Neck: No JVD present. No tracheal deviation present.  Cardiovascular: Normal rate, regular rhythm and normal heart sounds.  Pulmonary/Chest: Effort normal and breath sounds normal. No stridor.  Neurological: She is alert and oriented to person, place, and time.  Skin: Skin is warm and dry. She is not diaphoretic.  Psychiatric: She has a normal mood and affect. Her behavior is normal.    BP 134/76   Pulse 88   Temp 98.7 F (37.1 C) (Tympanic)   Ht 5' 2" (1.575 m)   Wt 202 lb 6.4 oz (91.8 kg)   SpO2 96%   BMI 37.02 kg/m  Wt Readings from Last 3 Encounters:  04/06/19 202 lb 6.4 oz (91.8 kg)  04/03/19 202 lb 6 oz (91.8 kg)   03/20/19 202 lb 6.4 oz (91.8 kg)     Health Maintenance Due  Topic Date Due  . Hepatitis C Screening  Never done  . FOOT EXAM  Never done  . OPHTHALMOLOGY EXAM  Never done  . TETANUS/TDAP  Never done  . MAMMOGRAM  Never done  . COLONOSCOPY  Never done  . DEXA SCAN  Never done  . PNA vac Low Risk Adult (1 of 2 - PCV13) Never done    There are no preventive care  reminders to display for this patient.  Lab Results  Component Value Date   TSH 3.59 12/15/2018   Lab Results  Component Value Date   WBC 5.2 03/28/2019   HGB 10.5 (L) 03/28/2019   HCT 34.2 (L) 03/28/2019   MCV 94.5 03/28/2019   PLT 183 03/28/2019   Lab Results  Component Value Date   NA 142 03/28/2019   K 3.8 03/28/2019   CO2 29 03/28/2019   GLUCOSE 128 (H) 03/28/2019   BUN 14 03/28/2019   CREATININE 0.77 03/28/2019   BILITOT 0.4 03/28/2019   ALKPHOS 60 03/28/2019   AST 14 (L) 03/28/2019   ALT 16 03/28/2019   PROT 6.6 03/28/2019   ALBUMIN 3.6 03/28/2019   CALCIUM 9.2 03/28/2019   ANIONGAP 10 03/28/2019   GFR 66.29 12/15/2018   Lab Results  Component Value Date   CHOL 127 12/15/2018   Lab Results  Component Value Date   HDL 35.90 (L) 12/15/2018   Lab Results  Component Value Date   LDLCALC 69 12/15/2018   Lab Results  Component Value Date   TRIG 110.0 12/15/2018   Lab Results  Component Value Date   CHOLHDL 4 12/15/2018   Lab Results  Component Value Date   HGBA1C 6.3 12/15/2018      Assessment & Plan:   Problem List Items Addressed This Visit      Respiratory   Seasonal allergic rhinitis due to pollen   Relevant Medications   cetirizine (ZYRTEC) 10 MG tablet     Endocrine   Hypothyroidism - Primary   Controlled type 2 diabetes mellitus with complication, without long-term current use of insulin (HCC)   Relevant Medications   simvastatin (ZOCOR) 40 MG tablet     Other   Primary insomnia   Relevant Medications   traZODone (DESYREL) 100 MG tablet   Iron deficiency  anemia   Relevant Orders   Hemoglobin and hematocrit, blood   Elevated cholesterol   Relevant Medications   simvastatin (ZOCOR) 40 MG tablet      Meds ordered this encounter  Medications  . cetirizine (ZYRTEC) 10 MG tablet    Sig: Take 1 tablet (10 mg total) by mouth daily.    Dispense:  90 tablet    Refill:  1  . simvastatin (ZOCOR) 40 MG tablet    Sig: Take 1 tablet (40 mg total) by mouth daily at 6 PM.    Dispense:  90 tablet    Refill:  3  . traZODone (DESYREL) 100 MG tablet    Sig: Take 1 tablet (100 mg total) by mouth at bedtime.    Dispense:  90 tablet    Refill:  1    Follow-up: Return in about 3 months (around 07/07/2019), or May add Victoza back.Libby Maw, MD

## 2019-04-10 DIAGNOSIS — E1165 Type 2 diabetes mellitus with hyperglycemia: Secondary | ICD-10-CM | POA: Diagnosis not present

## 2019-04-10 DIAGNOSIS — E039 Hypothyroidism, unspecified: Secondary | ICD-10-CM | POA: Diagnosis not present

## 2019-04-10 DIAGNOSIS — Z7984 Long term (current) use of oral hypoglycemic drugs: Secondary | ICD-10-CM | POA: Diagnosis not present

## 2019-04-10 DIAGNOSIS — Z789 Other specified health status: Secondary | ICD-10-CM | POA: Diagnosis not present

## 2019-04-10 DIAGNOSIS — D500 Iron deficiency anemia secondary to blood loss (chronic): Secondary | ICD-10-CM | POA: Diagnosis not present

## 2019-04-10 DIAGNOSIS — N83201 Unspecified ovarian cyst, right side: Secondary | ICD-10-CM | POA: Diagnosis not present

## 2019-04-13 ENCOUNTER — Inpatient Hospital Stay: Payer: Medicare HMO | Attending: Hematology and Oncology

## 2019-04-13 ENCOUNTER — Other Ambulatory Visit: Payer: Self-pay

## 2019-04-13 ENCOUNTER — Other Ambulatory Visit: Payer: Self-pay | Admitting: Hematology and Oncology

## 2019-04-13 DIAGNOSIS — D509 Iron deficiency anemia, unspecified: Secondary | ICD-10-CM | POA: Diagnosis not present

## 2019-04-13 DIAGNOSIS — D500 Iron deficiency anemia secondary to blood loss (chronic): Secondary | ICD-10-CM

## 2019-04-13 LAB — CBC WITH DIFFERENTIAL (CANCER CENTER ONLY)
Abs Immature Granulocytes: 0.03 10*3/uL (ref 0.00–0.07)
Basophils Absolute: 0.1 10*3/uL (ref 0.0–0.1)
Basophils Relative: 1 %
Eosinophils Absolute: 0.2 10*3/uL (ref 0.0–0.5)
Eosinophils Relative: 2 %
HCT: 38.6 % (ref 36.0–46.0)
Hemoglobin: 12.2 g/dL (ref 12.0–15.0)
Immature Granulocytes: 0 %
Lymphocytes Relative: 22 %
Lymphs Abs: 1.9 10*3/uL (ref 0.7–4.0)
MCH: 29.1 pg (ref 26.0–34.0)
MCHC: 31.6 g/dL (ref 30.0–36.0)
MCV: 92.1 fL (ref 80.0–100.0)
Monocytes Absolute: 0.6 10*3/uL (ref 0.1–1.0)
Monocytes Relative: 7 %
Neutro Abs: 5.9 10*3/uL (ref 1.7–7.7)
Neutrophils Relative %: 68 %
Platelet Count: 216 10*3/uL (ref 150–400)
RBC: 4.19 MIL/uL (ref 3.87–5.11)
RDW: 13.9 % (ref 11.5–15.5)
WBC Count: 8.6 10*3/uL (ref 4.0–10.5)
nRBC: 0 % (ref 0.0–0.2)

## 2019-04-13 LAB — CMP (CANCER CENTER ONLY)
ALT: 20 U/L (ref 0–44)
AST: 17 U/L (ref 15–41)
Albumin: 4 g/dL (ref 3.5–5.0)
Alkaline Phosphatase: 72 U/L (ref 38–126)
Anion gap: 12 (ref 5–15)
BUN: 15 mg/dL (ref 8–23)
CO2: 28 mmol/L (ref 22–32)
Calcium: 9.6 mg/dL (ref 8.9–10.3)
Chloride: 104 mmol/L (ref 98–111)
Creatinine: 0.84 mg/dL (ref 0.44–1.00)
GFR, Est AFR Am: >60 mL/min (ref >60)
GFR, Estimated: >60 mL/min (ref >60)
Glucose, Bld: 118 mg/dL — ABNORMAL HIGH (ref 70–99)
Potassium: 4 mmol/L (ref 3.5–5.1)
Sodium: 144 mmol/L (ref 135–145)
Total Bilirubin: 0.4 mg/dL (ref 0.3–1.2)
Total Protein: 7.4 g/dL (ref 6.5–8.1)

## 2019-04-13 LAB — RETIC PANEL
Immature Retic Fract: 8.8 % (ref 2.3–15.9)
RBC.: 4.13 MIL/uL (ref 3.87–5.11)
Retic Count, Absolute: 78.5 10*3/uL (ref 19.0–186.0)
Retic Ct Pct: 1.9 % (ref 0.4–3.1)
Reticulocyte Hemoglobin: 34.7 pg (ref >27.9)

## 2019-04-14 ENCOUNTER — Ambulatory Visit (HOSPITAL_COMMUNITY)
Admission: EM | Admit: 2019-04-14 | Discharge: 2019-04-14 | Disposition: A | Discharge status: Home / Self Care | Payer: Medicare HMO | Attending: Family Medicine | Admitting: Family Medicine

## 2019-04-14 ENCOUNTER — Other Ambulatory Visit: Payer: Self-pay

## 2019-04-14 ENCOUNTER — Encounter (HOSPITAL_COMMUNITY): Payer: Self-pay

## 2019-04-14 DIAGNOSIS — N390 Urinary tract infection, site not specified: Secondary | ICD-10-CM

## 2019-04-14 LAB — POCT URINALYSIS DIP (DEVICE)
Bilirubin Urine: NEGATIVE
Glucose, UA: NEGATIVE mg/dL
Ketones, ur: NEGATIVE mg/dL
Nitrite: NEGATIVE
Protein, ur: >=300 mg/dL — AB
Specific Gravity, Urine: 1.01 (ref 1.005–1.030)
Urobilinogen, UA: 0.2 mg/dL (ref 0.0–1.0)
pH: 6.5 (ref 5.0–8.0)

## 2019-04-14 MED ORDER — CEPHALEXIN 500 MG PO CAPS: 500.0000 mg | ORAL_CAPSULE | Freq: Three times a day (TID) | ORAL | 0 refills | Status: DC

## 2019-04-14 MED ORDER — PHENAZOPYRIDINE HCL 200 MG PO TABS: 200.0000 mg | ORAL_TABLET | Freq: Three times a day (TID) | ORAL | 0 refills | Status: DC | PRN

## 2019-04-14 NOTE — ED Triage Notes (Signed)
C/o burning with urination, lower abdominal pain that radiates to lower back, headache, bloating, nausea x2 days.

## 2019-04-14 NOTE — Discharge Instructions (Signed)
Consult your gynecologist about the ovarian cyst and the possibility that it is contributing to urinary tract infections   If no answer is forthcoming from gynecologist, call the urology center for further evaluation.

## 2019-04-14 NOTE — ED Provider Notes (Signed)
[place]    CSN: [id number] Arrival date & time: 04/14/19  1454      History   Chief Complaint Chief Complaint  Patient presents with  . Dysuria  . Back Pain    HPI Dana Nielsen is a 74 y.o. female.   Established [facility] patient visit  C/o burning with urination, lower abdominal pain that radiates to lower back, headache, bloating, nausea x2 days. She got much worse last night.  She has about 3 UTI's a year and has been told that she has an ovarian cyst the size of her fist on the right.     Past Medical History:  Diagnosis Date  . Diabetes mellitus type 2, diet-controlled ([place])   . HLD (hyperlipidemia)   . Hypothyroidism   . Iron deficiency anemia     Patient Active Problem List   Diagnosis Date Noted  . Seasonal allergic rhinitis due to pollen 04/06/2019  . Arterial hypotension 03/20/2019  . Labyrinthitis 03/20/2019  . Iron deficiency anemia due to chronic blood loss 03/09/2019  . Dieulafoy lesion of duodenum   . Gastric ulcer without hemorrhage or perforation   . Gastritis and gastroduodenitis   . Hiatal hernia   . GI bleeding 03/07/2019  . Blood loss anemia 03/07/2019  . Melena 03/05/2019  . Primary insomnia 12/14/2018  . Patient is Jehovah's Witness 12/14/2018  . Iron deficiency anemia 12/14/2018  . Hypothyroidism 12/14/2018  . Depression, major, single episode, mild ([place]) 12/14/2018  . Controlled type 2 diabetes mellitus with complication, without long-term current use of insulin ([place]) 12/14/2018  . Essential hypertension 12/14/2018  . Vitamin D deficiency 12/14/2018  . Elevated cholesterol 12/14/2018    Past Surgical History:  Procedure Laterality Date  . arthroscopic knee    . BIOPSY  03/08/2019   Procedure: BIOPSY;  Surgeon: Lavena Bullion, DO;  [facility];  Service: Gastroenterology;;  . C sections     x 7  . COLONOSCOPY  2019   x2 At the [place] and in [place]  . ENTEROSCOPY N/A 03/08/2019   Procedure: ENTEROSCOPY;  Surgeon: Lavena Bullion, DO;  [facility];  Service: Gastroenterology;  Laterality: N/A;  . HEMOSTASIS CLIP PLACEMENT  03/08/2019   Procedure: HEMOSTASIS CLIP PLACEMENT;  Surgeon: Lavena Bullion, DO;  [facility];  Service: Gastroenterology;;  . Lia Foyer TATTOO INJECTION  03/08/2019   Procedure: SUBMUCOSAL TATTOO INJECTION;  Surgeon: Lavena Bullion, DO;  [facility];  Service: Gastroenterology;;  . TONSILLECTOMY    . WISDOM TOOTH EXTRACTION    . WRIST SURGERY      OB History   No obstetric history on file.      Home Medications    Prior to Admission medications   Medication Sig Start Date End Date Taking? Authorizing Provider  ACCU-CHEK AVIVA PLUS test strip  03/09/19   [provider]  cephALEXin (KEFLEX) 500 MG capsule Take 1 capsule (500 mg total) by mouth 3 (three) times daily. 04/14/19   Robyn Haber, MD  cetirizine (ZYRTEC) 10 MG tablet Take 1 tablet (10 mg total) by mouth daily. 04/06/19   Libby Maw, MD  furosemide (LASIX) 20 MG tablet Take 20 mg by mouth daily.    [provider]  losartan (COZAAR) 25 MG tablet Take 25 mg by mouth daily.    [provider]  metFORMIN (GLUCOPHAGE) 1000 MG tablet Take 1,000 mg by mouth 2 (two) times daily with a meal.    [provider]  pantoprazole (PROTONIX) 40 MG tablet Take 1 tablet (40 mg total) by mouth 2 (two) times daily. 03/09/19 05/04/19  [place] Donnamarie Poag, DO  phenazopyridine (PYRIDIUM) 200 MG tablet Take 1 tablet (200 mg total) by mouth 3 (three) times daily as needed for pain. 04/14/19   Robyn Haber, MD  simvastatin (ZOCOR) 40 MG tablet Take 1 tablet (40 mg total) by mouth daily at 6 PM. 04/06/19   Libby Maw, MD  sucralfate (CARAFATE) 1 g tablet Take 1 tablet (1 g total) by mouth 4 (four) times daily. Dissolved in 15ML water 03/09/19 04/08/19  [place] Eric J, DO  SYNTHROID 100 MCG tablet Take 100 mcg by  mouth daily before breakfast.    [provider]  traZODone (DESYREL) 100 MG tablet Take 1 tablet (100 mg total) by mouth at bedtime. 04/06/19   Libby Maw, MD  venlafaxine (EFFEXOR) 75 MG tablet Take 75 mg by mouth daily.     [provider]    Family History Family History  Problem Relation Age of Onset  . Multiple myeloma Mother   . Heart attack Father   . Lung cancer Sister     Social History Social History   Tobacco Use  . Smoking status: Never Smoker  . Smokeless tobacco: Never Used  Substance Use Topics  . Alcohol use: Yes    Comment: Rarely  . Drug use: Never     Allergies   Lidocaine   Review of Systems Review of Systems  Constitutional: Negative.   Gastrointestinal: Positive for abdominal distention and nausea.  Genitourinary: Positive for dysuria and frequency.  Musculoskeletal: Positive for back pain.  All other systems reviewed and are negative.    Physical Exam Triage Vital Signs ED Triage Vitals  Enc Vitals Group     BP 04/14/19 1540 (!) 143/80     Pulse Rate 04/14/19 1540 (!) 109     Resp 04/14/19 1540 17     Temp 04/14/19 1540 98.1 F (36.7 C)     Temp Source 04/14/19 1540 Oral     SpO2 04/14/19 1540 97 %     Weight --      Height --      Head Circumference --      Peak Flow --      Pain Score 04/14/19 1537 7     Pain Loc --      Pain Edu? --      Excl. in [place]? --    No data found.  Updated Vital Signs BP (!) 143/80 (BP Location: Right Arm)   Pulse (!) 109   Temp 98.1 F (36.7 C) (Oral)   Resp 17   SpO2 97%    Physical Exam Vitals and nursing note reviewed.  Constitutional:      Appearance: Normal appearance. She is obese.  HENT:     Head: Normocephalic.  Eyes:     Conjunctiva/sclera: Conjunctivae normal.  Pulmonary:     Effort: Pulmonary effort is normal.  Abdominal:     General: There is distension.     Palpations: Abdomen is soft.     Tenderness: There is no abdominal tenderness.    Musculoskeletal:        General: Normal range of motion.     Cervical back: Normal range of motion and neck supple.  Skin:    General: Skin is warm and dry.  Neurological:     General: No focal deficit present.     Mental Status: She is  alert and oriented to person, place, and time.  Psychiatric:        Mood and Affect: Mood normal.        Behavior: Behavior normal.        Thought Content: Thought content normal.        Judgment: Judgment normal.      UC Treatments / Results  Labs (all labs ordered are listed, but only abnormal results are displayed) Labs Reviewed  POCT URINALYSIS DIP (DEVICE) - Abnormal; Notable for the following components:      Result Value   Hgb urine dipstick LARGE (*)    Protein, ur >=300 (*)    Leukocytes,Ua LARGE (*)    All other components within normal limits  URINE CULTURE      Initial Impression / Assessment and Plan / UC Course  I have reviewed the triage vital signs and the nursing notes.  Pertinent labs & imaging results that were available during my care of the patient were reviewed by me and considered in my medical decision making (see chart for details).    Final Clinical Impressions(s) / UC Diagnoses   Final diagnoses:  Lower urinary tract infectious disease     Discharge Instructions     Consult your gynecologist about the ovarian cyst and the possibility that it is contributing to urinary tract infections   If no answer is forthcoming from gynecologist, call the urology center for further evaluation.    ED Prescriptions    Medication Sig Dispense Auth. Provider   cephALEXin (KEFLEX) 500 MG capsule Take 1 capsule (500 mg total) by mouth 3 (three) times daily. 30 capsule Robyn Haber, MD   phenazopyridine (PYRIDIUM) 200 MG tablet Take 1 tablet (200 mg total) by mouth 3 (three) times daily as needed for pain. 10 tablet Robyn Haber, MD     I have reviewed the PDMP during this encounter.   Robyn Haber,  MD 04/14/19 [phone]

## 2019-04-16 LAB — URINE CULTURE: Culture: >=100000 — AB

## 2019-04-16 LAB — FERRITIN: Ferritin: 559 ng/mL — ABNORMAL HIGH (ref 11–307)

## 2019-04-16 LAB — IRON AND TIBC
Iron: 51 ug/dL (ref 41–142)
Saturation Ratios: 16 % — ABNORMAL LOW (ref 21–57)
TIBC: 319 ug/dL (ref 236–444)
UIBC: 268 ug/dL (ref 120–384)

## 2019-04-18 DIAGNOSIS — Z0000 Encounter for general adult medical examination without abnormal findings: Secondary | ICD-10-CM | POA: Diagnosis not present

## 2019-04-18 DIAGNOSIS — D500 Iron deficiency anemia secondary to blood loss (chronic): Secondary | ICD-10-CM | POA: Diagnosis not present

## 2019-04-18 DIAGNOSIS — E2839 Other primary ovarian failure: Secondary | ICD-10-CM | POA: Diagnosis not present

## 2019-04-18 DIAGNOSIS — R809 Proteinuria, unspecified: Secondary | ICD-10-CM | POA: Diagnosis not present

## 2019-04-19 ENCOUNTER — Encounter (HOSPITAL_COMMUNITY): Payer: Self-pay | Admitting: *Deleted

## 2019-04-19 ENCOUNTER — Other Ambulatory Visit: Payer: Self-pay | Admitting: Family Medicine

## 2019-04-19 ENCOUNTER — Other Ambulatory Visit: Payer: Self-pay

## 2019-04-19 ENCOUNTER — Inpatient Hospital Stay: Payer: Medicare HMO

## 2019-04-19 ENCOUNTER — Emergency Department (HOSPITAL_COMMUNITY)
Admission: EM | Admit: 2019-04-19 | Discharge: 2019-04-19 | Disposition: A | Discharge status: Home / Self Care | Payer: Medicare HMO | Attending: Emergency Medicine | Admitting: Emergency Medicine

## 2019-04-19 DIAGNOSIS — Z79899 Other long term (current) drug therapy: Secondary | ICD-10-CM | POA: Insufficient documentation

## 2019-04-19 DIAGNOSIS — T381X1A Poisoning by thyroid hormones and substitutes, accidental (unintentional), initial encounter: Secondary | ICD-10-CM | POA: Insufficient documentation

## 2019-04-19 DIAGNOSIS — T381X2A Poisoning by thyroid hormones and substitutes, intentional self-harm, initial encounter: Secondary | ICD-10-CM | POA: Diagnosis not present

## 2019-04-19 DIAGNOSIS — E039 Hypothyroidism, unspecified: Secondary | ICD-10-CM | POA: Diagnosis not present

## 2019-04-19 DIAGNOSIS — E2839 Other primary ovarian failure: Secondary | ICD-10-CM

## 2019-04-19 DIAGNOSIS — Z1231 Encounter for screening mammogram for malignant neoplasm of breast: Secondary | ICD-10-CM

## 2019-04-19 DIAGNOSIS — T6591XA Toxic effect of unspecified substance, accidental (unintentional), initial encounter: Secondary | ICD-10-CM

## 2019-04-19 NOTE — Discharge Instructions (Addendum)
Follow-up with your primary care doctor.  You need to have your thyroid levels checked in a few days prior to restarting your Synthroid.  Return to emergency room if you have any worsening symptoms including palpitations, rapid heart rate, chest pain, shortness of breath, vomiting or other worsening symptoms.

## 2019-04-19 NOTE — ED Triage Notes (Signed)
The pt woke  From sleep approx 0530 and thought she was eating candy and ate synthroid between 8 and 10 pills  177mcg  Poison control called ahead and reports   There should be no problems with this amount  mys give activated charcoal to get rid of some of the med or if anxious  Give the pt ativan.. pt does not appear anxious at present

## 2019-04-19 NOTE — ED Provider Notes (Addendum)
[facility] EMERGENCY DEPARTMENT Provider Note   CSN: [id number] Arrival date & time: 04/19/19  8119     History Chief Complaint  Patient presents with  . Ingestion    Dana Nielsen is a 74 y.o. female.  Patient is a 74 year old female who presents after an accidental ingestion.  She keeps her Synthroid on her headboard.  She woke up about 515 this morning and noticed that she was chewing on some pills which she thought was candy.  She then noticed that the Synthroid top was open and since she had recently done a pill count, she noticed that there were about 8 of the tablets missing.  She takes 100 mcg tablets.  She currently does not complain of any symptoms.  She says she feels fine with no palpitations or anxiety.        Past Medical History:  Diagnosis Date  . Diabetes mellitus type 2, diet-controlled ([place])   . HLD (hyperlipidemia)   . Hypothyroidism   . Iron deficiency anemia     Patient Active Problem List   Diagnosis Date Noted  . Seasonal allergic rhinitis due to pollen 04/06/2019  . Arterial hypotension 03/20/2019  . Labyrinthitis 03/20/2019  . Iron deficiency anemia due to chronic blood loss 03/09/2019  . Dieulafoy lesion of duodenum   . Gastric ulcer without hemorrhage or perforation   . Gastritis and gastroduodenitis   . Hiatal hernia   . GI bleeding 03/07/2019  . Blood loss anemia 03/07/2019  . Melena 03/05/2019  . Primary insomnia 12/14/2018  . Patient is Jehovah's Witness 12/14/2018  . Iron deficiency anemia 12/14/2018  . Hypothyroidism 12/14/2018  . Depression, major, single episode, mild ([place]) 12/14/2018  . Controlled type 2 diabetes mellitus with complication, without long-term current use of insulin ([place]) 12/14/2018  . Essential hypertension 12/14/2018  . Vitamin D deficiency 12/14/2018  . Elevated cholesterol 12/14/2018    Past Surgical History:  Procedure Laterality Date  . arthroscopic knee    . BIOPSY  03/08/2019   Procedure: BIOPSY;  Surgeon: Lavena Bullion, DO;  [facility];  Service: Gastroenterology;;  . C sections     x 7  . COLONOSCOPY  2019   x2 At the [place] and in [place]  . ENTEROSCOPY N/A 03/08/2019   Procedure: ENTEROSCOPY;  Surgeon: Lavena Bullion, DO;  [facility];  Service: Gastroenterology;  Laterality: N/A;  . HEMOSTASIS CLIP PLACEMENT  03/08/2019   Procedure: HEMOSTASIS CLIP PLACEMENT;  Surgeon: Lavena Bullion, DO;  [facility];  Service: Gastroenterology;;  . Lia Foyer TATTOO INJECTION  03/08/2019   Procedure: SUBMUCOSAL TATTOO INJECTION;  Surgeon: Lavena Bullion, DO;  [facility];  Service: Gastroenterology;;  . TONSILLECTOMY    . WISDOM TOOTH EXTRACTION    . WRIST SURGERY       OB History   No obstetric history on file.     Family History  Problem Relation Age of Onset  . Multiple myeloma Mother   . Heart attack Father   . Lung cancer Sister     Social History   Tobacco Use  . Smoking status: Never Smoker  . Smokeless tobacco: Never Used  Substance Use Topics  . Alcohol use: Yes    Comment: Rarely  . Drug use: Never    Home Medications Prior to Admission medications   Medication Sig Start Date End Date Taking? Authorizing Provider  ACCU-CHEK AVIVA PLUS test strip  03/09/19   [provider]  cephALEXin (KEFLEX) 500 MG capsule Take 1 capsule (500 mg total) by mouth 3 (three) times daily. 04/14/19   Robyn Haber, MD  cetirizine (ZYRTEC) 10 MG tablet Take 1 tablet (10 mg total) by mouth daily. 04/06/19   Libby Maw, MD  furosemide (LASIX) 20 MG tablet Take 20 mg by mouth daily.    [provider]  losartan (COZAAR) 25 MG tablet Take 25 mg by mouth daily.    [provider]  metFORMIN (GLUCOPHAGE) 1000 MG tablet Take 1,000 mg by mouth 2 (two) times daily with a meal.    [provider]  pantoprazole (PROTONIX) 40 MG tablet Take 1 tablet (40 mg total)  by mouth 2 (two) times daily. 03/09/19 05/04/19  [place] Donnamarie Poag, DO  phenazopyridine (PYRIDIUM) 200 MG tablet Take 1 tablet (200 mg total) by mouth 3 (three) times daily as needed for pain. 04/14/19   Robyn Haber, MD  simvastatin (ZOCOR) 40 MG tablet Take 1 tablet (40 mg total) by mouth daily at 6 PM. 04/06/19   Libby Maw, MD  sucralfate (CARAFATE) 1 g tablet Take 1 tablet (1 g total) by mouth 4 (four) times daily. Dissolved in 15ML water 03/09/19 04/08/19  [place] Eric J, DO  SYNTHROID 100 MCG tablet Take 100 mcg by mouth daily before breakfast.    [provider]  traZODone (DESYREL) 100 MG tablet Take 1 tablet (100 mg total) by mouth at bedtime. 04/06/19   Libby Maw, MD  venlafaxine (EFFEXOR) 75 MG tablet Take 75 mg by mouth daily.     [provider]    Allergies    Lidocaine  Review of Systems   Review of Systems  Constitutional: Negative for chills, diaphoresis, fatigue and fever.  HENT: Negative for congestion, rhinorrhea and sneezing.   Eyes: Negative.   Respiratory: Negative for cough, chest tightness and shortness of breath.   Cardiovascular: Negative for chest pain and leg swelling.  Gastrointestinal: Negative for abdominal pain, diarrhea, nausea and vomiting.  Genitourinary: Negative for difficulty urinating.  Musculoskeletal: Negative for arthralgias.  Skin: Negative for rash.  Neurological: Negative for dizziness, speech difficulty, weakness, numbness and headaches.    Physical Exam Updated Vital Signs BP (!) 159/79 (BP Location: Left Arm)   Pulse (!) 110   Temp 98.5 F (36.9 C) (Oral)   Resp 16   SpO2 97%   Physical Exam Constitutional:      Appearance: She is well-developed.  HENT:     Head: Normocephalic and atraumatic.  Eyes:     Pupils: Pupils are equal, round, and reactive to light.  Cardiovascular:     Rate and Rhythm: Normal rate and regular rhythm.     Heart sounds: Normal heart sounds.  Pulmonary:      Effort: Pulmonary effort is normal. No respiratory distress.     Breath sounds: Normal breath sounds. No wheezing or rales.  Chest:     Chest wall: No tenderness.  Abdominal:     General: Bowel sounds are normal.     Palpations: Abdomen is soft.     Tenderness: There is no abdominal tenderness. There is no guarding or rebound.  Musculoskeletal:        General: Normal range of motion.     Cervical back: Normal range of motion and neck supple.  Lymphadenopathy:     Cervical: No cervical adenopathy.  Skin:    General: Skin is warm and dry.     Findings: No rash.  Neurological:  Mental Status: She is alert and oriented to person, place, and time.     ED Results / Procedures / Treatments   Labs (all labs ordered are listed, but only abnormal results are displayed) Labs Reviewed - No data to display  EKG None  Radiology No results found.  Procedures Procedures (including critical care time)  Medications Ordered in ED Medications - No data to display  ED Course  I have reviewed the triage vital signs and the nursing notes.  Pertinent labs & imaging results that were available during my care of the patient were reviewed by me and considered in my medical decision making (see chart for details).    MDM Rules/Calculators/A&P                      Patient presents after an ingestion of Synthroid.  She currently is asymptomatic.  Her heart rate is 94 on my exam.  I spoke with poison control and they said that there is nothing to do at this point.  They felt that the amount that she took would likely not pose any threat or cause any significant symptoms.  They did not feel that that amount will cause thyroid storm but that would be the symptoms to look for.  They felt that the greatest symptoms would be 3 to 4 days out.  They did say that activated charcoal could be given although at this point patient is already 2 hours out from ingestion and likely would not be effective.   Patient was discharged home in good condition.  She was advised to watch out for any worsening symptoms and return if these occurred.  She was advised to follow-up with her PCP and have her thyroid levels checked prior to restarting her Synthroid.  Return precautions were given. Final Clinical Impression(s) / ED Diagnoses Final diagnoses:  Ingestion of substance, accidental or unintentional, initial encounter    Rx / DC Orders ED Discharge Orders    None       Malvin Johns, MD 04/19/19 4696    Malvin Johns, MD 04/19/19 0745

## 2019-04-24 DIAGNOSIS — E039 Hypothyroidism, unspecified: Secondary | ICD-10-CM | POA: Diagnosis not present

## 2019-04-24 DIAGNOSIS — E119 Type 2 diabetes mellitus without complications: Secondary | ICD-10-CM | POA: Diagnosis not present

## 2019-04-24 DIAGNOSIS — Z6835 Body mass index (BMI) 35.0-35.9, adult: Secondary | ICD-10-CM | POA: Diagnosis not present

## 2019-04-24 DIAGNOSIS — R809 Proteinuria, unspecified: Secondary | ICD-10-CM | POA: Diagnosis not present

## 2019-04-24 DIAGNOSIS — E785 Hyperlipidemia, unspecified: Secondary | ICD-10-CM | POA: Diagnosis not present

## 2019-04-24 DIAGNOSIS — I10 Essential (primary) hypertension: Secondary | ICD-10-CM | POA: Diagnosis not present

## 2019-04-27 DIAGNOSIS — R0789 Other chest pain: Secondary | ICD-10-CM | POA: Diagnosis not present

## 2019-04-27 DIAGNOSIS — R42 Dizziness and giddiness: Secondary | ICD-10-CM | POA: Diagnosis not present

## 2019-05-04 DIAGNOSIS — N83201 Unspecified ovarian cyst, right side: Secondary | ICD-10-CM | POA: Diagnosis not present

## 2019-05-11 DIAGNOSIS — H25811 Combined forms of age-related cataract, right eye: Secondary | ICD-10-CM | POA: Diagnosis not present

## 2019-06-05 ENCOUNTER — Ambulatory Visit: Payer: Medicare HMO | Attending: Family Medicine | Admitting: Physical Therapy

## 2019-06-05 ENCOUNTER — Other Ambulatory Visit: Payer: Self-pay

## 2019-06-05 VITALS — BP 96/72 | HR 96

## 2019-06-05 DIAGNOSIS — R42 Dizziness and giddiness: Secondary | ICD-10-CM | POA: Insufficient documentation

## 2019-06-05 DIAGNOSIS — H8111 Benign paroxysmal vertigo, right ear: Secondary | ICD-10-CM | POA: Diagnosis not present

## 2019-06-05 DIAGNOSIS — R2681 Unsteadiness on feet: Secondary | ICD-10-CM | POA: Diagnosis not present

## 2019-06-05 NOTE — Patient Instructions (Signed)
Sit to Side-Lying - lie down on Rt side first    Sit on edge of bed. 1. Turn head 45 to right. 2. Maintain head position and lie down slowly on left side. Hold until symptoms subside. 3. Sit up slowly. Hold until symptoms subside. 4. Turn head 45 to left. 5. Maintain head position and lie down slowly on right side. Hold until symptoms subside. 6. Sit up slowly. Repeat sequence __5 __ times per session. Do __2-3__ sessions per day.  Copyright  VHI. All rights reserved.

## 2019-06-06 ENCOUNTER — Encounter: Payer: Self-pay | Admitting: Physical Therapy

## 2019-06-06 ENCOUNTER — Telehealth: Payer: Self-pay | Admitting: Gastroenterology

## 2019-06-06 DIAGNOSIS — K529 Noninfective gastroenteritis and colitis, unspecified: Secondary | ICD-10-CM | POA: Diagnosis not present

## 2019-06-06 NOTE — Telephone Encounter (Signed)
Spoke to patient who states that she has been having multiple yellow loose stools daily over the  past 3 days. She developed a fever last night of 102, will reduce to 100 after taking Tylenol,but goes back up after a few hours. She feels very weak and is not able to eat or drink without having a loose stool. She is not able to get an appointment with her PCP and our office does not have an available appointment today. Patient was advised to go to urgent care or the ER. Patient stated she will go to the ER today.

## 2019-06-06 NOTE — Therapy (Signed)
[facility] [address] [place], [place], [address] Phone: [phone]   Fax:  [phone]  Physical Therapy Evaluation  Patient Details  Name: Dana Nielsen MRN: [id number] Date of Birth: 1945/02/13 Referring Provider (PT): Sela Hilding, MD   Encounter Date: 06/05/2019  PT End of Session - 06/06/19 2112    Visit Number  1    Number of Visits  5    Date for PT Re-Evaluation  07/06/19    Authorization Type  [employer]    Authorization Time Period  06-05-19 - 08-05-19    PT Start Time  1320    PT Stop Time  1405    PT Time Calculation (min)  45 min    Activity Tolerance  Patient tolerated treatment well    Behavior During Therapy  [facility] for tasks assessed/performed       Past Medical History:  Diagnosis Date  . Diabetes mellitus type 2, diet-controlled ([place])   . HLD (hyperlipidemia)   . Hypothyroidism   . Iron deficiency anemia     Past Surgical History:  Procedure Laterality Date  . arthroscopic knee    . BIOPSY  03/08/2019   Procedure: BIOPSY;  Surgeon: Lavena Bullion, DO;  [facility];  Service: Gastroenterology;;  . C sections     x 7  . COLONOSCOPY  2019   x2 At the [facility] and in [place]  . ENTEROSCOPY N/A 03/08/2019   Procedure: ENTEROSCOPY;  Surgeon: Lavena Bullion, DO;  [facility];  Service: Gastroenterology;  Laterality: N/A;  . HEMOSTASIS CLIP PLACEMENT  03/08/2019   Procedure: HEMOSTASIS CLIP PLACEMENT;  Surgeon: Lavena Bullion, DO;  [facility];  Service: Gastroenterology;;  . Lia Foyer TATTOO INJECTION  03/08/2019   Procedure: SUBMUCOSAL TATTOO INJECTION;  Surgeon: Lavena Bullion, DO;  [facility];  Service: Gastroenterology;;  . TONSILLECTOMY    . WISDOM TOOTH EXTRACTION    . WRIST SURGERY      Vitals:   06/05/19 1346  BP: 96/72  Pulse: 96   BP in standing 114/71      [facility] PT Assessment - 06/06/19 0001       Assessment   Medical Diagnosis  Vertigo    Referring Provider (PT)  Sela Hilding, MD    Onset Date/Surgical Date  --   early March 2021     Precautions   Precautions  None      Prior Function   Level of Independence  Independent      Observation/Other Assessments   Focus on Therapeutic Outcomes (FOTO)   pt's FS primary measure 45/100 : risk adjusted statistical 65/100    Other Surveys   Dizziness Handicap Inventory [facility])    Dizziness Handicap Inventory ([place])   62% (severe handicap)             Vestibular Assessment - 06/06/19 0001      Vestibular Assessment   General Observation  pt is a 74 yr old lady with c/o vertigo that occurs with change in position       Symptom Behavior   Subjective history of current problem  pt states vertigo started approx. 3 months ago - occurs intermittently     Type of Dizziness   Spinning    Frequency of Dizziness  depends on the movement but usually occurs daily    Duration of Dizziness  secs to minutes    Symptom Nature  Positional    Aggravating Factors  Forward bending;Rolling to right;Lying supine    Relieving Factors  Head stationary    Progression of Symptoms  No change since onset      Oculomotor Exam   Oculomotor Alignment  Normal    Ocular ROM  WNL's    Spontaneous  Absent    Smooth Pursuits  Intact    Saccades  Intact      Oculomotor Exam-Fixation Suppressed    Ocular Alignment  normal      Positional Testing   Dix-Hallpike  Dix-Hallpike Right;Dix-Hallpike Left    Sidelying Test  Sidelying Right;Sidelying Left    Horizontal Canal Testing  Horizontal Canal Right;Horizontal Canal Left      Dix-Hallpike Right   Dix-Hallpike Right Duration  no nystagmus noted but pt c/o dizziness - approx. 10 sec duration    Dix-Hallpike Right Symptoms  No nystagmus      Dix-Hallpike Left   Dix-Hallpike Left Duration  none    Dix-Hallpike Left Symptoms  No nystagmus      Sidelying Right   Sidelying Right Duration   approx. 4 secs    Sidelying Right Symptoms  No nystagmus      Sidelying Left   Sidelying Left Duration  none    Sidelying Left Symptoms  No nystagmus      Horizontal Canal Right   Horizontal Canal Right Duration  none    Horizontal Canal Right Symptoms  Normal      Horizontal Canal Left   Horizontal Canal Left Duration  none    Horizontal Canal Left Symptoms  Normal          Objective measurements completed on examination: See above findings.     Epley maneuver for Rt BPPV performed 2 reps - no nystagmus noted in any position on either rep:  Pt reported slight improvement on 2nd rep         PT Education - 06/06/19 2111    Education Details  Brandt-Daroff exercise for habituation; info on BPPV etiology from [place]    Person(s) Educated  Patient    Methods  Explanation;Demonstration;Handout    Comprehension  Verbalized understanding;Returned demonstration       PT Short Term Goals - 06/06/19 2119      PT SHORT TERM GOAL #1   Title  same as LTG's        PT Long Term Goals - 06/06/19 2119      PT LONG TERM GOAL #1   Title  Pt will report no vertigo with any bed mobility.    Time  4    Period  Weeks    Status  New    Target Date  07/06/19      PT LONG TERM GOAL #2   Title  Improve DHI from 62% to </= 34% for reduced vertigo for improved quality of life.    Baseline  62%    Time  4    Period  Weeks    Status  New    Target Date  07/06/19      PT LONG TERM GOAL #3   Title  Independent in HEP for habituation of vertigo.    Time  4    Period  Weeks    Status  New    Target Date  07/06/19      PT LONG TERM GOAL #4   Title  Pt will report ability to sleep in bed without experiencing vertigo with lying down.    Baseline  pt states she  is currently sleeping in recliner due to fear that vertigo will occur with sit to supine    Time  4    Period  Weeks    Status  New    Target Date  07/06/19             Plan - 06/06/19 2113    Clinical  Impression Statement  Pt has symptoms consistent with Rt BPPV - posterior canalithiasis, however, no nystagmus was provoked during any positional testing.  Rt Epley maneuver was performed 2 reps with pt reporting slight improvement on 2nd rep compared to 1st rep.  Will continue to assess and treat vertigo.    Personal Factors and Comorbidities  Comorbidity 1;Time since onset of injury/illness/exacerbation;Fitness;Behavior Pattern    Comorbidities  type 2 DM controlled, depression, labyrinthritis    Examination-Activity Limitations  Bed Mobility;Transfers;Squat;Lift;Bend;Reach Overhead;Locomotion Level    Examination-Participation Restrictions  Cleaning;Community Activity;Shop;Laundry;Meal Prep    Stability/Clinical Decision Making  Stable/Uncomplicated    Clinical Decision Making  Low    Rehab Potential  Good    PT Frequency  1x / week    PT Duration  4 weeks    PT Treatment/Interventions  ADLs/Self Care Home Management;Vestibular;Therapeutic activities;Therapeutic exercise;Balance training;Neuromuscular re-education;Patient/family education;Canalith Repostioning    PT Next Visit Plan  recheck Rt Dix-Hallpike for Rt BPPV & treat prn;  (unable to provoke nystagmus on eval on 06-05-19) - continue to assess vertigo as pt stated that "today is a good day" (eval 06-05-19)    PT Home Exercise Plan  Brandt-Daroff given 06-05-19    Consulted and Agree with Plan of Care  Patient       Patient will benefit from skilled therapeutic intervention in order to improve the following deficits and impairments:  Difficulty walking, Dizziness, Decreased balance  Visit Diagnosis: BPPV (benign paroxysmal positional vertigo), right - Plan: PT plan of care cert/re-cert  Dizziness and giddiness - Plan: PT plan of care cert/re-cert  Unsteadiness on feet - Plan: PT plan of care cert/re-cert     Problem List Patient Active Problem List   Diagnosis Date Noted  . Seasonal allergic rhinitis due to pollen 04/06/2019   . Arterial hypotension 03/20/2019  . Labyrinthitis 03/20/2019  . Iron deficiency anemia due to chronic blood loss 03/09/2019  . Dieulafoy lesion of duodenum   . Gastric ulcer without hemorrhage or perforation   . Gastritis and gastroduodenitis   . Hiatal hernia   . GI bleeding 03/07/2019  . Blood loss anemia 03/07/2019  . Melena 03/05/2019  . Primary insomnia 12/14/2018  . Patient is Jehovah's Witness 12/14/2018  . Iron deficiency anemia 12/14/2018  . Hypothyroidism 12/14/2018  . Depression, major, single episode, mild ([place]) 12/14/2018  . Controlled type 2 diabetes mellitus with complication, without long-term current use of insulin ([place]) 12/14/2018  . Essential hypertension 12/14/2018  . Vitamin D deficiency 12/14/2018  . Elevated cholesterol 12/14/2018    [place], PT 06/06/2019, 9:25 PM  [facility] [address] [place], [place], [address] Phone: [phone]   Fax:  [phone]  Name: Annesa Oberholtzer MRN: [id number] Date of Birth: February 01, 1945

## 2019-06-06 NOTE — Telephone Encounter (Signed)
Pt reported that she is experiencing yellow stool, abd pain and 102 F fever.  Please advise.

## 2019-06-18 DIAGNOSIS — H52223 Regular astigmatism, bilateral: Secondary | ICD-10-CM | POA: Diagnosis not present

## 2019-06-18 DIAGNOSIS — H52203 Unspecified astigmatism, bilateral: Secondary | ICD-10-CM | POA: Diagnosis not present

## 2019-06-18 DIAGNOSIS — H5213 Myopia, bilateral: Secondary | ICD-10-CM | POA: Diagnosis not present

## 2019-06-18 DIAGNOSIS — H524 Presbyopia: Secondary | ICD-10-CM | POA: Diagnosis not present

## 2019-06-18 DIAGNOSIS — H25812 Combined forms of age-related cataract, left eye: Secondary | ICD-10-CM | POA: Diagnosis not present

## 2019-06-18 DIAGNOSIS — Z961 Presence of intraocular lens: Secondary | ICD-10-CM | POA: Diagnosis not present

## 2019-06-19 ENCOUNTER — Ambulatory Visit: Payer: Medicare HMO

## 2019-06-19 DIAGNOSIS — R6883 Chills (without fever): Secondary | ICD-10-CM | POA: Diagnosis not present

## 2019-06-19 DIAGNOSIS — Z03818 Encounter for observation for suspected exposure to other biological agents ruled out: Secondary | ICD-10-CM | POA: Diagnosis not present

## 2019-06-19 DIAGNOSIS — R5383 Other fatigue: Secondary | ICD-10-CM | POA: Diagnosis not present

## 2019-06-26 DIAGNOSIS — R630 Anorexia: Secondary | ICD-10-CM | POA: Diagnosis not present

## 2019-06-26 DIAGNOSIS — N1831 Chronic kidney disease, stage 3a: Secondary | ICD-10-CM | POA: Diagnosis not present

## 2019-06-26 DIAGNOSIS — Z7984 Long term (current) use of oral hypoglycemic drugs: Secondary | ICD-10-CM | POA: Diagnosis not present

## 2019-06-26 DIAGNOSIS — E1165 Type 2 diabetes mellitus with hyperglycemia: Secondary | ICD-10-CM | POA: Diagnosis not present

## 2019-06-27 ENCOUNTER — Ambulatory Visit: Payer: Medicare HMO

## 2019-07-02 ENCOUNTER — Ambulatory Visit
Admission: RE | Admit: 2019-07-02 | Discharge: 2019-07-02 | Disposition: A | Discharge status: Home / Self Care | Payer: Medicare HMO | Source: Ambulatory Visit | Attending: Family Medicine | Admitting: Family Medicine

## 2019-07-02 ENCOUNTER — Other Ambulatory Visit: Payer: Self-pay

## 2019-07-02 DIAGNOSIS — Z1231 Encounter for screening mammogram for malignant neoplasm of breast: Secondary | ICD-10-CM

## 2019-07-02 DIAGNOSIS — E2839 Other primary ovarian failure: Secondary | ICD-10-CM

## 2019-07-02 DIAGNOSIS — Z780 Asymptomatic menopausal state: Secondary | ICD-10-CM | POA: Diagnosis not present

## 2019-07-03 ENCOUNTER — Encounter: Payer: Medicare HMO | Admitting: Physical Therapy

## 2019-07-04 ENCOUNTER — Encounter: Payer: Medicare HMO | Admitting: Family Medicine

## 2019-07-04 NOTE — Progress Notes (Deleted)
Dana Nielsen is a 74 y.o. female  No chief complaint on file.   HPI: Dana Nielsen is a 74 y.o. female here for [facility] appt, previous PCP Dr. Ethelene Hal, and for  Past Medical History:  Diagnosis Date  . Diabetes mellitus type 2, diet-controlled ([place])   . HLD (hyperlipidemia)   . Hypothyroidism   . Iron deficiency anemia     Past Surgical History:  Procedure Laterality Date  . arthroscopic knee    . BIOPSY  03/08/2019   Procedure: BIOPSY;  Surgeon: Lavena Bullion, DO;  [facility];  Service: Gastroenterology;;  . C sections     x 7  . COLONOSCOPY  2019   x2 At the [facility] and in [place]  . ENTEROSCOPY N/A 03/08/2019   Procedure: ENTEROSCOPY;  Surgeon: Lavena Bullion, DO;  [facility];  Service: Gastroenterology;  Laterality: N/A;  . HEMOSTASIS CLIP PLACEMENT  03/08/2019   Procedure: HEMOSTASIS CLIP PLACEMENT;  Surgeon: Lavena Bullion, DO;  [facility];  Service: Gastroenterology;;  . Lia Foyer TATTOO INJECTION  03/08/2019   Procedure: SUBMUCOSAL TATTOO INJECTION;  Surgeon: Lavena Bullion, DO;  [facility];  Service: Gastroenterology;;  . TONSILLECTOMY    . WISDOM TOOTH EXTRACTION    . WRIST SURGERY      Social History   Socioeconomic History  . Marital status: Widowed    Spouse name: Not on file  . Number of children: 81  . Years of education: Not on file  . Highest education level: Not on file  Occupational History  . Not on file  Tobacco Use  . Smoking status: Never Smoker  . Smokeless tobacco: Never Used  Vaping Use  . Vaping Use: Never used  Substance and Sexual Activity  . Alcohol use: Yes    Comment: Rarely  . Drug use: Never  . Sexual activity: Not on file  Other Topics Concern  . Not on file  Social History Narrative  . Not on file   Social Determinants of Health   Financial Resource Strain:   . Difficulty of Paying Living Expenses:   Food Insecurity:   . Worried About  Charity fundraiser in the Last Year:   . Arboriculturist in the Last Year:   Transportation Needs:   . Film/video editor (Medical):   Marland Kitchen Lack of Transportation (Non-Medical):   Physical Activity:   . Days of Exercise per Week:   . Minutes of Exercise per Session:   Stress:   . Feeling of Stress :   Social Connections:   . Frequency of Communication with Friends and Family:   . Frequency of Social Gatherings with Friends and Family:   . Attends Religious Services:   . Active Member of Clubs or Organizations:   . Attends Archivist Meetings:   Marland Kitchen Marital Status:   Intimate Partner Violence:   . Fear of Current or Ex-Partner:   . Emotionally Abused:   Marland Kitchen Physically Abused:   . Sexually Abused:     Family History  Problem Relation Age of Onset  . Multiple myeloma Mother   . Heart attack Father   . Lung cancer Sister      Immunization History  Administered Date(s) Administered  . Influenza-Unspecified 11/10/2018    Outpatient Encounter Medications as of 07/04/2019  Medication Sig  . ACCU-CHEK AVIVA PLUS test strip   . cetirizine (ZYRTEC) 10 MG tablet Take 1 tablet (10 mg total) by mouth  daily.  . furosemide (LASIX) 20 MG tablet Take 20 mg by mouth daily.  Marland Kitchen losartan (COZAAR) 25 MG tablet Take 25 mg by mouth daily.  . metFORMIN (GLUCOPHAGE) 1000 MG tablet Take 1,000 mg by mouth 2 (two) times daily with a meal.  . pantoprazole (PROTONIX) 40 MG tablet Take 1 tablet (40 mg total) by mouth 2 (two) times daily.  . simvastatin (ZOCOR) 40 MG tablet Take 1 tablet (40 mg total) by mouth daily at 6 PM.  . sucralfate (CARAFATE) 1 g tablet Take 1 tablet (1 g total) by mouth 4 (four) times daily. Dissolved in 15ML water  . SYNTHROID 100 MCG tablet Take 100 mcg by mouth daily before breakfast.  . traZODone (DESYREL) 100 MG tablet Take 1 tablet (100 mg total) by mouth at bedtime.  Marland Kitchen venlafaxine (EFFEXOR) 75 MG tablet Take 75 mg by mouth daily.   . [DISCONTINUED] cephALEXin  (KEFLEX) 500 MG capsule Take 1 capsule (500 mg total) by mouth 3 (three) times daily. (Patient not taking: Reported on 06/05/2019)  . [DISCONTINUED] phenazopyridine (PYRIDIUM) 200 MG tablet Take 1 tablet (200 mg total) by mouth 3 (three) times daily as needed for pain. (Patient not taking: Reported on 06/05/2019)   No facility-administered encounter medications on file as of 07/04/2019.     ROS: Pertinent positives and negatives noted in HPI. Remainder of ROS non-contributory    Allergies  Allergen Reactions  . Lidocaine Shortness Of Breath    There were no vitals taken for this visit.   BP Readings from Last 3 Encounters:  06/05/19 96/72  04/19/19 137/65  04/14/19 (!) 143/80   Pulse Readings from Last 3 Encounters:  06/05/19 96  04/19/19 94  04/14/19 (!) 109   Wt Readings from Last 3 Encounters:  04/06/19 202 lb 6.4 oz (91.8 kg)  04/03/19 202 lb 6 oz (91.8 kg)  03/20/19 202 lb 6.4 oz (91.8 kg)     Physical Exam   A/P:     This visit occurred during the SARS-CoV-2 public health emergency.  Safety protocols were in place, including screening questions prior to the visit, additional usage of staff PPE, and extensive cleaning of exam room while observing appropriate contact time as indicated for disinfecting solutions.

## 2019-07-10 ENCOUNTER — Encounter: Payer: Medicare HMO | Admitting: Physical Therapy

## 2019-07-10 DIAGNOSIS — R319 Hematuria, unspecified: Secondary | ICD-10-CM | POA: Diagnosis not present

## 2019-07-10 DIAGNOSIS — N390 Urinary tract infection, site not specified: Secondary | ICD-10-CM | POA: Diagnosis not present

## 2019-07-12 DIAGNOSIS — R3121 Asymptomatic microscopic hematuria: Secondary | ICD-10-CM | POA: Diagnosis not present

## 2019-07-12 DIAGNOSIS — N390 Urinary tract infection, site not specified: Secondary | ICD-10-CM | POA: Diagnosis not present

## 2019-07-18 ENCOUNTER — Other Ambulatory Visit: Payer: Self-pay | Admitting: Hematology and Oncology

## 2019-07-18 DIAGNOSIS — D500 Iron deficiency anemia secondary to blood loss (chronic): Secondary | ICD-10-CM

## 2019-07-18 NOTE — Progress Notes (Signed)
[place] [phone]   [phone]  PROGRESS NOTE  Patient Care Team: Dana Smoker, MD as PCP - General (Family Medicine)  Hematological/Oncological History #Iron Deficiency Anemia 2/2 to GI Bleeding 1) 2019: presented to [facility] in [facility] with symptoms of anemia. Found to have Hgb 5.0. EGD/colon/capsule reported found no source of bleed 2) 01/18/2019: establish care with Dr. Lorenso Nielsen  3) 03/08/2019: admitted for acute GI bleed. Endoscopy revealed an actively bleeding Dieulafoy lesion that was clipped. Received IV feraheme 544m x 1 dose during admission 4) 03/15/2019: received dose 2 of IV feraheme 5146min outpatient setting. 5) 04/13/2019: WBC 8.6, Hgb 12.2, MCV 92.1, Plt 216 6) 07/19/2019: WBC 7.1, Hgb 12.2, MCV 90.6, Plt 226  Interval History:  GeChandy Tarman74o. female with medical history significant for Dieulafoy lesion bleeding with iron deficiency anemia who presents for a follow up visit. The patient's last visit was on 01/18/2019 at which time she established care. In the interim since the last visit she had a hospitalization in Feb 2021 for GI bleed during which endoscopy found the duodenal Dieulafoy lesion and had it clipped. She has had no further hospitalizations/ED visits on concern for bleeding.  On exam today Dana Nielsen notes that she has been well since her last visit. She reports that she is consuming in the fitness club and has been able to exercise quite well. She does report that her energy level is not quite back to the baseline she was hoping for. She notes that she was at "100%" last summer in 2020, however she is not even "75%" at this time. She notes that she has decreased her intake of red meat and only eats this about once per week. She is continue to eat fish and vegetables and reports that she has not been having any issues with dark bowel movements or blood in her stool. She does endorse having frequent urinary  tract infections, but otherwise no major issues in her health. A full 10 point ROS is listed below.  MEDICAL HISTORY:  Past Medical History:  Diagnosis Date   Diabetes mellitus type 2, diet-controlled ([place]   HLD (hyperlipidemia)    Hypothyroidism    Iron deficiency anemia     SURGICAL HISTORY: Past Surgical History:  Procedure Laterality Date   arthroscopic knee     BIOPSY  03/08/2019   Procedure: BIOPSY;  Surgeon: CiLavena BullionDO;  [facility]: [facility];  Service: Gastroenterology;;   C sections     x 7   [facility]   [facility] [place]   ENTEROSCOPY N/A 03/08/2019   Procedure: ENTEROSCOPY;  Surgeon: CiLavena BullionDO;  [facility];  Service: Gastroenterology;  Laterality: N/A;   HEMOSTASIS CLIP PLACEMENT  03/08/2019   Procedure: HEMOSTASIS CLIP PLACEMENT;  Surgeon: CiLavena BullionDO;  [facility];  Service: Gastroenterology;;   SUBMUCOSAL TATTOO INJECTION  03/08/2019   Procedure: SUBMUCOSAL TATTOO INJECTION;  Surgeon: CiLavena BullionDO;  [facility];  Service: Gastroenterology;;   TONSILLECTOMY     WISDOM TOOTH EXTRACTION     WRIST SURGERY      SOCIAL HISTORY: Social History   Socioeconomic History   Marital status: Widowed    Spouse name: Not on file   Number of children: 9   Years of education: Not on file   Highest education level: Not on file  Occupational History   Not on file  Tobacco Use   Smoking status: Never Nielsen   Smokeless tobacco: Never Used  Vaping Use   Vaping Use: Never used  Substance and Sexual Activity   Alcohol use: Yes    Comment: Rarely   Drug use: Never   Sexual activity: Not on file  Other Topics Concern   Not on file  Social History Narrative   Not on file   Social Determinants of Health   Financial Resource Strain:    Difficulty of Paying Living Expenses:   Food Insecurity:    Worried About Charity fundraiser in  the Last Year:    Arboriculturist in the Last Year:   Transportation Needs:    Film/video editor (Medical):    Lack of Transportation (Non-Medical):   Physical Activity:    Days of Exercise per Week:    Minutes of Exercise per Session:   Stress:    Feeling of Stress :   Social Connections:    Frequency of Communication with Friends and Family:    Frequency of Social Gatherings with Friends and Family:    Attends Religious Services:    Active Member of Clubs or Organizations:    Attends Music therapist:    Marital Status:   Intimate Partner Violence:    Fear of Current or Ex-Partner:    Emotionally Abused:    Physically Abused:    Sexually Abused:     FAMILY HISTORY: Family History  Problem Relation Age of Onset   Multiple myeloma Mother    Heart attack Father    Lung cancer Sister     ALLERGIES:  is allergic to lidocaine.  MEDICATIONS:  Current Outpatient Medications  Medication Sig Dispense Refill   ACCU-CHEK AVIVA PLUS test strip      cetirizine (ZYRTEC) 10 MG tablet Take 1 tablet (10 mg total) by mouth daily. 90 tablet 1   furosemide (LASIX) 20 MG tablet Take 20 mg by mouth daily.     losartan (COZAAR) 25 MG tablet Take 25 mg by mouth daily.     metFORMIN (GLUCOPHAGE) 1000 MG tablet Take 1,000 mg by mouth 2 (two) times daily with a meal.     pantoprazole (PROTONIX) 40 MG tablet Take 1 tablet (40 mg total) by mouth 2 (two) times daily. 112 tablet 0   simvastatin (ZOCOR) 40 MG tablet Take 1 tablet (40 mg total) by mouth daily at 6 PM. 90 tablet 3   sucralfate (CARAFATE) 1 g tablet Take 1 tablet (1 g total) by mouth 4 (four) times daily. Dissolved in 15ML water 120 tablet 0   SYNTHROID 100 MCG tablet Take 100 mcg by mouth daily before breakfast.     traZODone (DESYREL) 100 MG tablet Take 1 tablet (100 mg total) by mouth at bedtime. 90 tablet 1   venlafaxine (EFFEXOR) 75 MG tablet Take 75 mg by mouth daily.      No  current facility-administered medications for this visit.    REVIEW OF SYSTEMS:   Constitutional: ( - ) fevers, ( - )  chills , ( - ) night sweats Eyes: ( - ) blurriness of vision, ( - ) double vision, ( - ) watery eyes Ears, nose, mouth, throat, and face: ( - ) mucositis, ( - ) sore throat Respiratory: ( - ) cough, ( - ) dyspnea, ( - ) wheezes Cardiovascular: ( - ) palpitation, ( - ) chest discomfort, ( - ) lower extremity swelling Gastrointestinal:  ( - ) nausea, ( - )  heartburn, ( - ) change in bowel habits Skin: ( - ) abnormal skin rashes Lymphatics: ( - ) new lymphadenopathy, ( - ) easy bruising Neurological: ( - ) numbness, ( - ) tingling, ( - ) new weaknesses Behavioral/Psych: ( - ) mood change, ( - ) new changes  All other systems were reviewed with the patient and are negative.  PHYSICAL EXAMINATION: ECOG PERFORMANCE STATUS: 1 - Symptomatic but completely ambulatory  Vitals:   07/19/19 1118  BP: 139/77  Pulse: 87  Resp: 18  Temp: (!) 97.5 F (36.4 C)  SpO2: 98%   Filed Weights   07/19/19 1118  Weight: 201 lb 3.2 oz (91.3 kg)    GENERAL:well appearing elderly Caucasian female alert, no distress and comfortable SKIN: skin color, texture, turgor are normal, no rashes or significant lesions EYES: conjunctiva are pink and non-injected, sclera clear LUNGS: clear to auscultation and percussion with normal breathing effort HEART: regular rate & rhythm and no murmurs and no lower extremity edema Musculoskeletal: no cyanosis of digits and no clubbing  PSYCH: alert & oriented x 3, fluent speech NEURO: no focal motor/sensory deficits  LABORATORY DATA:  I have reviewed the data as listed CBC Latest Ref Rng & Units 07/19/2019 04/13/2019 04/06/2019  WBC 4.0 - 10.5 K/uL 7.1 8.6 -  Hemoglobin 12.0 - 15.0 g/dL 12.2 12.2 11.8(L)  Hematocrit 36 - 46 % 37.8 38.6 35.5(L)  Platelets 150 - 400 K/uL 226 216 -    CMP Latest Ref Rng & Units 07/19/2019 04/13/2019 03/28/2019  Glucose 70 - 99  mg/dL 105(H) 118(H) 128(H)  BUN 8 - 23 mg/dL '17 15 14  ' Creatinine 0.44 - 1.00 mg/dL 0.81 0.84 0.77  Sodium 135 - 145 mmol/L 146(H) 144 142  Potassium 3.5 - 5.1 mmol/L 4.0 4.0 3.8  Chloride 98 - 111 mmol/L 109 104 103  CO2 22 - 32 mmol/L '26 28 29  ' Calcium 8.9 - 10.3 mg/dL 9.7 9.6 9.2  Total Protein 6.5 - 8.1 g/dL 7.3 7.4 6.6  Total Bilirubin 0.3 - 1.2 mg/dL 0.3 0.4 0.4  Alkaline Phos 38 - 126 U/L 61 72 60  AST 15 - 41 U/L 17 17 14(L)  ALT 0 - 44 U/L '23 20 16      ' RADIOGRAPHIC STUDIES: DG BONE DENSITY (DXA)  Result Date: 07/04/2019 EXAM: DUAL X-RAY ABSORPTIOMETRY (DXA) FOR BONE MINERAL DENSITY IMPRESSION: Referring Physician:  Glenis Nielsen Your patient completed a BMD test using Lunar IDXA DXA system ( analysis version: 16 ) manufactured by [employer]. Technologist: LAP/AW PATIENT: Name: Dana Nielsen, Dana Nielsen Patient ID: [id number] Birth Date: Nov 12, 1945 Height:     62.0 in. Sex: Female Measured: 07/02/2019 Weight:     197.0 lbs. Indications: Diabetic non insulin, Estrogen Deficient, Postmenopausal, Synthroid Fractures: Treatments: ASSESSMENT: The BMD measured at Femur Neck Right is 0.967 g/cm2 with a T-score of -0.5. This patient is considered NORMAL according to [facility]) criteria. The scan quality is good. Site Region Measured Date Measured Age YA T-score BMD Significant CHANGE DualFemur Neck Right 07/02/2019    74.4         -0.5    0.967 g/cm2 AP Spine  L1-L4      [birth year]02/2019    74.4         0.8     1.276 g/cm2 DualFemur Total Mean 07[age]02/2019    74.4         0.9     1.122 g/cm2 World Health Organization [facility]) criteria for post-menopausal, Caucasian  Women: Normal       T-score at or above -1 SD Osteopenia   T-score between -1 and -2.5 SD Osteoporosis T-score at or below -2.5 SD RECOMMENDATION: 1. All patients should optimize calcium and vitamin D intake. 2. Consider FDA approved medical therapies in postmenopausal women and men aged 34 years and older, based on the  following: a. A hip or vertebral (clinical or morphometric) fracture b. T-score < or = -2.5 at the femoral neck or spine after appropriate evaluation to exclude secondary causes c. Low bone mass (T-score between -1.0 and -2.5 at the femoral neck or spine) and a 10 year probability of a hip fracture > or = 3% or a 10 year probability of a major osteoporosis-related fracture > or = 20% based on the [place]-adapted WHO algorithm d. Clinician judgment and/or patient preferences may indicate treatment for people with 10-year fracture probabilities above or below these levels FOLLOW-UP: Patients with diagnosis of osteoporosis or at high risk for fracture should have regular bone mineral density tests. For patients eligible for Medicare, routine testing is allowed once every 2 years. The testing frequency can be increased to one year for patients who have rapidly progressing disease, those who are receiving or discontinuing medical therapy to restore bone mass, or have additional risk factors. I have reviewed this report and agree with the above findings. Mark A. Thornton Papas, M.D. [facility] Radiology Electronically Signed   By: Lavonia Dana M.D.   On: 07/04/2019 10:36   MM 3D SCREEN BREAST BILATERAL  Result Date: 07/04/2019 CLINICAL DATA:  Screening. EXAM: DIGITAL SCREENING BILATERAL MAMMOGRAM WITH TOMO AND CAD COMPARISON:  None. ACR Breast Density Category b: There are scattered areas of fibroglandular density. FINDINGS: There are no findings suspicious for malignancy. Images were processed with CAD. IMPRESSION: No mammographic evidence of malignancy. A result letter of this screening mammogram will be mailed directly to the patient. RECOMMENDATION: Screening mammogram in one year. (Code:SM-B-01Y) BI-RADS CATEGORY  1: Negative. Electronically Signed   By: Lajean Manes M.D.   On: 07/04/2019 13:48    ASSESSMENT & PLAN Dana Nielsen 74 y.o. female with medical history significant for Dieulafoy lesion bleeding with iron  deficiency anemia who presents for a follow up visit. After review the labs, discussion with the patient, and physical examination it appears that the patient has stable hemoglobin over the last 3 months time. She has no evidence of GI bleeding and her iron stores appear replete at this time. Given these findings I think would be safe to follow-up with her in approximately 6 months time with an interval 74-monthlab checkup to assure that she has not slowly losing blood. As always we are happy to see the patient back sooner than those visits if she is feeling more symptomatic or has concerns for GI bleeding. Overall she is stable at this time.  #Iron Deficiency Anemia 2/2 to GI Bleeding --today will recheck iron panel, ferritin, reticulocyte panel, and CBC --patient is s/p IV iron in in March 2021 --no evidence of bleeding on today's exam. Hgb is currently stably within the normal range --plan for RTC in 6 months with interval lab check in 3 months time. We can always see her sooner if she were to develop new symptoms or concern for dropping Hgb.   #Jehovah's Witness --the patient is not to receive blood products under any circumstances, per her request. We have copied and uploaded her Blood Contract which she carries into her wallet into our system. --in the  event the patient is admitted with severe anemia or bleed please make the Hematology service aware so that we may provide bloodless options for support.   No orders of the defined types were placed in this encounter.  All questions were answered. The patient knows to call the clinic with any problems, questions or concerns.  A total of more than 30 minutes were spent on this encounter and over half of that time was spent on counseling and coordination of care as outlined above.   Ledell Peoples, MD Department of Hematology/Oncology [place] [facility] Phone: [phone] Pager: [phone] Email:  [email or web address]  07/19/2019 5:25 PM

## 2019-07-19 ENCOUNTER — Inpatient Hospital Stay: Payer: Medicare HMO | Attending: Hematology and Oncology | Admitting: Hematology and Oncology

## 2019-07-19 ENCOUNTER — Encounter: Payer: Self-pay | Admitting: Hematology and Oncology

## 2019-07-19 ENCOUNTER — Inpatient Hospital Stay: Payer: Medicare HMO

## 2019-07-19 ENCOUNTER — Other Ambulatory Visit: Payer: Self-pay

## 2019-07-19 VITALS — BP 139/77 | HR 87 | Temp 97.5°F | Resp 18 | Ht 62.0 in | Wt 201.2 lb

## 2019-07-19 DIAGNOSIS — K922 Gastrointestinal hemorrhage, unspecified: Secondary | ICD-10-CM | POA: Diagnosis not present

## 2019-07-19 DIAGNOSIS — E785 Hyperlipidemia, unspecified: Secondary | ICD-10-CM | POA: Diagnosis not present

## 2019-07-19 DIAGNOSIS — Z801 Family history of malignant neoplasm of trachea, bronchus and lung: Secondary | ICD-10-CM | POA: Insufficient documentation

## 2019-07-19 DIAGNOSIS — D500 Iron deficiency anemia secondary to blood loss (chronic): Secondary | ICD-10-CM | POA: Diagnosis not present

## 2019-07-19 DIAGNOSIS — Z789 Other specified health status: Secondary | ICD-10-CM

## 2019-07-19 DIAGNOSIS — E119 Type 2 diabetes mellitus without complications: Secondary | ICD-10-CM | POA: Diagnosis not present

## 2019-07-19 DIAGNOSIS — IMO0001 Reserved for inherently not codable concepts without codable children: Secondary | ICD-10-CM

## 2019-07-19 DIAGNOSIS — E039 Hypothyroidism, unspecified: Secondary | ICD-10-CM | POA: Diagnosis not present

## 2019-07-19 DIAGNOSIS — Z79899 Other long term (current) drug therapy: Secondary | ICD-10-CM | POA: Diagnosis not present

## 2019-07-19 LAB — CMP (CANCER CENTER ONLY)
ALT: 23 U/L (ref 0–44)
AST: 17 U/L (ref 15–41)
Albumin: 3.9 g/dL (ref 3.5–5.0)
Alkaline Phosphatase: 61 U/L (ref 38–126)
Anion gap: 11 (ref 5–15)
BUN: 17 mg/dL (ref 8–23)
CO2: 26 mmol/L (ref 22–32)
Calcium: 9.7 mg/dL (ref 8.9–10.3)
Chloride: 109 mmol/L (ref 98–111)
Creatinine: 0.81 mg/dL (ref 0.44–1.00)
GFR, Est AFR Am: >60 mL/min (ref >60)
GFR, Estimated: >60 mL/min (ref >60)
Glucose, Bld: 105 mg/dL — ABNORMAL HIGH (ref 70–99)
Potassium: 4 mmol/L (ref 3.5–5.1)
Sodium: 146 mmol/L — ABNORMAL HIGH (ref 135–145)
Total Bilirubin: 0.3 mg/dL (ref 0.3–1.2)
Total Protein: 7.3 g/dL (ref 6.5–8.1)

## 2019-07-19 LAB — CBC WITH DIFFERENTIAL (CANCER CENTER ONLY)
Abs Immature Granulocytes: 0.03 10*3/uL (ref 0.00–0.07)
Basophils Absolute: 0.1 10*3/uL (ref 0.0–0.1)
Basophils Relative: 1 %
Eosinophils Absolute: 0.3 10*3/uL (ref 0.0–0.5)
Eosinophils Relative: 4 %
HCT: 37.8 % (ref 36.0–46.0)
Hemoglobin: 12.2 g/dL (ref 12.0–15.0)
Immature Granulocytes: 0 %
Lymphocytes Relative: 33 %
Lymphs Abs: 2.4 10*3/uL (ref 0.7–4.0)
MCH: 29.3 pg (ref 26.0–34.0)
MCHC: 32.3 g/dL (ref 30.0–36.0)
MCV: 90.6 fL (ref 80.0–100.0)
Monocytes Absolute: 0.7 10*3/uL (ref 0.1–1.0)
Monocytes Relative: 10 %
Neutro Abs: 3.6 10*3/uL (ref 1.7–7.7)
Neutrophils Relative %: 52 %
Platelet Count: 226 10*3/uL (ref 150–400)
RBC: 4.17 MIL/uL (ref 3.87–5.11)
RDW: 14.6 % (ref 11.5–15.5)
WBC Count: 7.1 10*3/uL (ref 4.0–10.5)
nRBC: 0 % (ref 0.0–0.2)

## 2019-07-19 LAB — IRON AND TIBC
Iron: 78 ug/dL (ref 41–142)
Saturation Ratios: 25 % (ref 21–57)
TIBC: 308 ug/dL (ref 236–444)
UIBC: 230 ug/dL (ref 120–384)

## 2019-07-19 LAB — RETIC PANEL
Immature Retic Fract: 13.7 % (ref 2.3–15.9)
RBC.: 4.18 MIL/uL (ref 3.87–5.11)
Retic Count, Absolute: 74.8 10*3/uL (ref 19.0–186.0)
Retic Ct Pct: 1.8 % (ref 0.4–3.1)
Reticulocyte Hemoglobin: 34.7 pg (ref >27.9)

## 2019-07-19 LAB — FERRITIN: Ferritin: 412 ng/mL — ABNORMAL HIGH (ref 11–307)

## 2019-07-23 ENCOUNTER — Telehealth: Payer: Self-pay | Admitting: *Deleted

## 2019-07-23 NOTE — Telephone Encounter (Signed)
TCT patient regarding lab results. No answer but was able to leave detailed message on identified phone#. Advised that labs were good last week, that her iron levels are holding. Advised that we will have her come back in 3 months for lab check and in 6 months for labs and clinic visit. Advised that she can call with any questions or concerns at [phone]

## 2019-07-23 NOTE — Telephone Encounter (Signed)
-----   Message from Orson Slick, MD sent at 07/19/2019  4:48 PM EDT ----- Please let Dana Nielsen know that her iron levels look excellent and it appears she is holding onto her iron well. We will plan to see her back in 3 months for a lab check and 6 months for a clinic visit. In the interim if she is having symptoms or concerns we are happy to see her in clinic or have lab checks earlier.   ----- Message ----- From: Buel Ream, Lab In [place] Sent: 07/19/2019  10:38 AM EDT To: Orson Slick, MD

## 2019-07-26 ENCOUNTER — Telehealth: Payer: Self-pay | Admitting: Hematology and Oncology

## 2019-07-26 DIAGNOSIS — R3121 Asymptomatic microscopic hematuria: Secondary | ICD-10-CM | POA: Diagnosis not present

## 2019-07-26 NOTE — Telephone Encounter (Signed)
Scheduled per los. Called and left msg. Mailed printout  °

## 2019-09-20 ENCOUNTER — Other Ambulatory Visit: Payer: Self-pay | Admitting: Family Medicine

## 2019-09-20 DIAGNOSIS — F5101 Primary insomnia: Secondary | ICD-10-CM

## 2019-10-08 ENCOUNTER — Ambulatory Visit (HOSPITAL_COMMUNITY)
Admission: EM | Admit: 2019-10-08 | Discharge: 2019-10-08 | Disposition: A | Discharge status: Home / Self Care | Payer: Medicare HMO | Attending: Family Medicine | Admitting: Family Medicine

## 2019-10-08 ENCOUNTER — Encounter (HOSPITAL_COMMUNITY): Payer: Self-pay

## 2019-10-08 ENCOUNTER — Other Ambulatory Visit: Payer: Self-pay

## 2019-10-08 DIAGNOSIS — E119 Type 2 diabetes mellitus without complications: Secondary | ICD-10-CM | POA: Diagnosis not present

## 2019-10-08 DIAGNOSIS — Z79890 Hormone replacement therapy (postmenopausal): Secondary | ICD-10-CM | POA: Insufficient documentation

## 2019-10-08 DIAGNOSIS — H9209 Otalgia, unspecified ear: Secondary | ICD-10-CM | POA: Insufficient documentation

## 2019-10-08 DIAGNOSIS — D500 Iron deficiency anemia secondary to blood loss (chronic): Secondary | ICD-10-CM | POA: Insufficient documentation

## 2019-10-08 DIAGNOSIS — Z79899 Other long term (current) drug therapy: Secondary | ICD-10-CM | POA: Diagnosis not present

## 2019-10-08 DIAGNOSIS — E039 Hypothyroidism, unspecified: Secondary | ICD-10-CM | POA: Insufficient documentation

## 2019-10-08 DIAGNOSIS — Z7984 Long term (current) use of oral hypoglycemic drugs: Secondary | ICD-10-CM | POA: Insufficient documentation

## 2019-10-08 DIAGNOSIS — R05 Cough: Secondary | ICD-10-CM | POA: Diagnosis not present

## 2019-10-08 DIAGNOSIS — Z20822 Contact with and (suspected) exposure to covid-19: Secondary | ICD-10-CM | POA: Insufficient documentation

## 2019-10-08 DIAGNOSIS — J069 Acute upper respiratory infection, unspecified: Secondary | ICD-10-CM | POA: Diagnosis not present

## 2019-10-08 DIAGNOSIS — R519 Headache, unspecified: Secondary | ICD-10-CM | POA: Insufficient documentation

## 2019-10-08 DIAGNOSIS — E785 Hyperlipidemia, unspecified: Secondary | ICD-10-CM | POA: Diagnosis not present

## 2019-10-08 DIAGNOSIS — I10 Essential (primary) hypertension: Secondary | ICD-10-CM | POA: Insufficient documentation

## 2019-10-08 DIAGNOSIS — K254 Chronic or unspecified gastric ulcer with hemorrhage: Secondary | ICD-10-CM | POA: Insufficient documentation

## 2019-10-08 NOTE — Discharge Instructions (Signed)
COVID test pending Flonase, zyrtec daily Honey and hot tea Mucinex Ibuprofen and tylenol Follow up if not improving or worsening

## 2019-10-08 NOTE — ED Provider Notes (Signed)
[place]    CSN: [id number] Arrival date & time: 10/08/19  1322      History   Chief Complaint Chief Complaint  Patient presents with  . Otalgia    x 4 days  . Headache    x 4 days  . Nasal Congestion    x 4 days    HPI Dana Nielsen is a 74 y.o. female history of DM type II, PAD, hypertension, presenting today for evaluation of URI symptoms.  Patient reports that she has had bilateral ear pain, cough, congestion and headache.  Symptoms began approximately 4 days ago.  Denies fevers, but has had generalized body aches.  Denies exposures.  Patient is a Restaurant manager, fast food.  HPI  Past Medical History:  Diagnosis Date  . Diabetes mellitus type 2, diet-controlled ([place])   . HLD (hyperlipidemia)   . Hypothyroidism   . Iron deficiency anemia     Patient Active Problem List   Diagnosis Date Noted  . Seasonal allergic rhinitis due to pollen 04/06/2019  . Arterial hypotension 03/20/2019  . Labyrinthitis 03/20/2019  . Iron deficiency anemia due to chronic blood loss 03/09/2019  . Dieulafoy lesion of duodenum   . Gastric ulcer without hemorrhage or perforation   . Gastritis and gastroduodenitis   . Hiatal hernia   . GI bleeding 03/07/2019  . Blood loss anemia 03/07/2019  . Melena 03/05/2019  . Primary insomnia 12/14/2018  . Patient is Jehovah's Witness 12/14/2018  . Iron deficiency anemia 12/14/2018  . Hypothyroidism 12/14/2018  . Depression, major, single episode, mild ([place]) 12/14/2018  . Controlled type 2 diabetes mellitus with complication, without long-term current use of insulin ([place]) 12/14/2018  . Essential hypertension 12/14/2018  . Vitamin D deficiency 12/14/2018  . Elevated cholesterol 12/14/2018    Past Surgical History:  Procedure Laterality Date  . arthroscopic knee    . BIOPSY  03/08/2019   Procedure: BIOPSY;  Surgeon: Lavena Bullion, DO;  [facility];  Service: Gastroenterology;;  . C sections     x 7  . COLONOSCOPY   2019   x2 At the [facility] in [place]  . ENTEROSCOPY N/A 03/08/2019   Procedure: ENTEROSCOPY;  Surgeon: Lavena Bullion, DO;  [facility];  Service: Gastroenterology;  Laterality: N/A;  . HEMOSTASIS CLIP PLACEMENT  03/08/2019   Procedure: HEMOSTASIS CLIP PLACEMENT;  Surgeon: Lavena Bullion, DO;  [facility];  Service: Gastroenterology;;  . Lia [place] TATTOO INJECTION  03/08/2019   Procedure: SUBMUCOSAL TATTOO INJECTION;  Surgeon: Lavena Bullion, DO;  [facility];  Service: Gastroenterology;;  . TONSILLECTOMY    . WISDOM TOOTH EXTRACTION    . WRIST SURGERY      OB History   No obstetric history on file.      Home Medications    Prior to Admission medications   Medication Sig Start Date End Date Taking? Authorizing Provider  ACCU-CHEK AVIVA PLUS test strip  03/09/19  Yes [provider]  cetirizine (ZYRTEC) 10 MG tablet Take 1 tablet (10 mg total) by mouth daily. 04/06/19  Yes Libby Maw, MD  furosemide (LASIX) 20 MG tablet Take 20 mg by mouth daily.   Yes [provider]  losartan (COZAAR) 25 MG tablet Take 25 mg by mouth daily.   Yes [provider]  metFORMIN (GLUCOPHAGE) 1000 MG tablet Take 1,000 mg by mouth 2 (two) times daily with a meal.   Yes [provider]  simvastatin (ZOCOR) 40 MG tablet  Take 1 tablet (40 mg total) by mouth daily at 6 PM. 04/06/19  Yes Libby Maw, MD  SYNTHROID 100 MCG tablet Take 100 mcg by mouth daily before breakfast.   Yes [provider]  traZODone (DESYREL) 100 MG tablet TAKE 1 TABLET BY MOUTH AT BEDTIME 09/20/19  Yes Libby Maw, MD  venlafaxine [facility]) 75 MG tablet Take 75 mg by mouth daily.    Yes [provider]  pantoprazole (PROTONIX) 40 MG tablet Take 1 tablet (40 mg total) by mouth 2 (two) times daily. 03/09/19 05/04/19  [place] Donnamarie Poag, DO  sucralfate (CARAFATE) 1 g tablet Take 1 tablet (1 g total) by mouth 4  (four) times daily. Dissolved in 15ML water 03/09/19 04/08/19  [place] Eric J, DO    Family History Family History  Problem Relation Age of Onset  . Multiple myeloma Mother   . Heart attack Father   . Lung cancer Sister     Social History Social History   Tobacco Use  . Smoking status: Never Smoker  . Smokeless tobacco: Never Used  Vaping Use  . Vaping Use: Never used  Substance Use Topics  . Alcohol use: Yes    Comment: Rarely  . Drug use: Never     Allergies   Lidocaine   Review of Systems Review of Systems  Constitutional: Positive for appetite change and chills. Negative for activity change, fatigue and fever.  HENT: Positive for congestion, rhinorrhea and sinus pressure. Negative for ear pain, sore throat and trouble swallowing.   Eyes: Negative for discharge and redness.  Respiratory: Negative for cough, chest tightness and shortness of breath.   Cardiovascular: Negative for chest pain.  Gastrointestinal: Negative for abdominal pain, diarrhea, nausea and vomiting.  Musculoskeletal: Positive for myalgias.  Skin: Negative for rash.  Neurological: Positive for headaches. Negative for dizziness and light-headedness.     Physical Exam Triage Vital Signs ED Triage Vitals  Enc Vitals Group     BP 10/08/19 1424 (!) 149/66     Pulse Rate 10/08/19 1424 79     Resp 10/08/19 1424 19     Temp 10/08/19 1424 98.3 F (36.8 C)     Temp Source 10/08/19 1424 Oral     SpO2 10/08/19 1424 98 %     Weight --      Height --      Head Circumference --      Peak Flow --      Pain Score 10/08/19 1427 9     Pain Loc --      Pain Edu? --      Excl. in [place]? --    No data found.  Updated Vital Signs BP (!) 149/66 (BP Location: Right Arm)   Pulse 79   Temp 98.3 F (36.8 C) (Oral)   Resp 19   SpO2 98%   Visual Acuity Right Eye Distance:   Left Eye Distance:   Bilateral Distance:    Right Eye Near:   Left Eye Near:    Bilateral Near:     Physical Exam Vitals and  nursing note reviewed.  Constitutional:      Appearance: She is well-developed.     Comments: No acute distress  HENT:     Head: Normocephalic and atraumatic.     Ears:     Comments: Bilateral ears without tenderness to palpation of external auricle, tragus and mastoid, EAC's without erythema or swelling, TM's with good bony landmarks and cone of light. Non  erythematous.     Nose: Nose normal.     Mouth/Throat:     Comments: Oral mucosa pink and moist, no tonsillar enlargement or exudate. Posterior pharynx patent and nonerythematous, no uvula deviation or swelling. Normal phonation. Eyes:     Conjunctiva/sclera: Conjunctivae normal.  Cardiovascular:     Rate and Rhythm: Normal rate.  Pulmonary:     Effort: Pulmonary effort is normal. No respiratory distress.     Comments: Breathing comfortably at rest, CTABL, no wheezing, rales or other adventitious sounds auscultated Abdominal:     General: There is no distension.  Musculoskeletal:        General: Normal range of motion.     Cervical back: Neck supple.  Skin:    General: Skin is warm and dry.  Neurological:     Mental Status: She is alert and oriented to person, place, and time.      UC Treatments / Results  Labs (all labs ordered are listed, but only abnormal results are displayed) Labs Reviewed  SARS CORONAVIRUS 2 (TAT 6-24 HRS)    EKG   Radiology No results found.  Procedures Procedures (including critical care time)  Medications Ordered in UC Medications - No data to display  Initial Impression / Assessment and Plan / UC Course  I have reviewed the triage vital signs and the nursing notes.  Pertinent labs & imaging results that were available during my care of the patient were reviewed by me and considered in my medical decision making (see chart for details).     Covid PCR pending, suspect likely viral etiology, exam reassuring.  Rest and fluids.  Continue symptomatic and supportive care.  Discussed  strict return precautions. Patient verbalized understanding and is agreeable with plan.  Final Clinical Impressions(s) / UC Diagnoses   Final diagnoses:  Viral URI with cough     Discharge Instructions     COVID test pending Flonase, zyrtec daily Honey and hot tea Mucinex Ibuprofen and tylenol Follow up if not improving or worsening    ED Prescriptions    None     PDMP not reviewed this encounter.   Bhavya Grand, [place] C, PA-C 10/08/19 1520

## 2019-10-08 NOTE — ED Triage Notes (Signed)
Pt states bilateral ear pain, cough, congestion, and headache x 4 days. PT states no fever but does have generalized body aches. Pt is aox4 and ambulatory.

## 2019-10-09 LAB — SARS CORONAVIRUS 2 (TAT 6-24 HRS): SARS Coronavirus 2: NEGATIVE

## 2019-10-12 DIAGNOSIS — E1165 Type 2 diabetes mellitus with hyperglycemia: Secondary | ICD-10-CM | POA: Diagnosis not present

## 2019-10-12 DIAGNOSIS — E039 Hypothyroidism, unspecified: Secondary | ICD-10-CM | POA: Diagnosis not present

## 2019-10-12 DIAGNOSIS — M170 Bilateral primary osteoarthritis of knee: Secondary | ICD-10-CM | POA: Diagnosis not present

## 2019-10-12 DIAGNOSIS — D500 Iron deficiency anemia secondary to blood loss (chronic): Secondary | ICD-10-CM | POA: Diagnosis not present

## 2019-10-12 DIAGNOSIS — Z7984 Long term (current) use of oral hypoglycemic drugs: Secondary | ICD-10-CM | POA: Diagnosis not present

## 2019-10-12 DIAGNOSIS — R5383 Other fatigue: Secondary | ICD-10-CM | POA: Diagnosis not present

## 2019-10-19 ENCOUNTER — Other Ambulatory Visit: Payer: Self-pay

## 2019-10-19 ENCOUNTER — Telehealth: Payer: Self-pay | Admitting: *Deleted

## 2019-10-19 ENCOUNTER — Inpatient Hospital Stay: Payer: Medicare HMO | Attending: Hematology and Oncology

## 2019-10-19 DIAGNOSIS — D509 Iron deficiency anemia, unspecified: Secondary | ICD-10-CM | POA: Diagnosis not present

## 2019-10-19 DIAGNOSIS — D500 Iron deficiency anemia secondary to blood loss (chronic): Secondary | ICD-10-CM

## 2019-10-19 LAB — CBC WITH DIFFERENTIAL (CANCER CENTER ONLY)
Abs Immature Granulocytes: 0.03 10*3/uL (ref 0.00–0.07)
Basophils Absolute: 0.1 10*3/uL (ref 0.0–0.1)
Basophils Relative: 1 %
Eosinophils Absolute: 0.2 10*3/uL (ref 0.0–0.5)
Eosinophils Relative: 3 %
HCT: 38.5 % (ref 36.0–46.0)
Hemoglobin: 12.3 g/dL (ref 12.0–15.0)
Immature Granulocytes: 0 %
Lymphocytes Relative: 34 %
Lymphs Abs: 2.3 10*3/uL (ref 0.7–4.0)
MCH: 29.4 pg (ref 26.0–34.0)
MCHC: 31.9 g/dL (ref 30.0–36.0)
MCV: 91.9 fL (ref 80.0–100.0)
Monocytes Absolute: 0.5 10*3/uL (ref 0.1–1.0)
Monocytes Relative: 7 %
Neutro Abs: 3.8 10*3/uL (ref 1.7–7.7)
Neutrophils Relative %: 55 %
Platelet Count: 217 10*3/uL (ref 150–400)
RBC: 4.19 MIL/uL (ref 3.87–5.11)
RDW: 13.5 % (ref 11.5–15.5)
WBC Count: 6.9 10*3/uL (ref 4.0–10.5)
nRBC: 0 % (ref 0.0–0.2)

## 2019-10-19 LAB — IRON AND TIBC
Iron: 95 ug/dL (ref 41–142)
Saturation Ratios: 30 % (ref 21–57)
TIBC: 315 ug/dL (ref 236–444)
UIBC: 220 ug/dL (ref 120–384)

## 2019-10-19 LAB — FERRITIN: Ferritin: 380 ng/mL — ABNORMAL HIGH (ref 11–307)

## 2019-10-19 NOTE — Telephone Encounter (Signed)
TCT patient regarding lab results from today.  Spoke with her and informed her that all her labs look good, especially her HGB. Her HGB is normal.  Advised that we would see her back in January of 2022.  If she develops any worsening symptoms we can see her before then. She voiced understanding.

## 2019-10-19 NOTE — Telephone Encounter (Signed)
-----   Message from Orson Slick, MD sent at 10/19/2019 12:22 PM EDT ----- Please let Dana Nielsen know that her Hgb levels are within normal limits. If she were to develop any new or worsening symptoms we would be happy to see her prior to her next scheduled visit in Jan 2021.  ----- Message ----- From: Buel Ream, Lab In [place]: 10/19/2019  11:58 AM EDT To: Orson Slick, MD

## 2019-10-31 ENCOUNTER — Encounter: Payer: Self-pay | Admitting: Hematology and Oncology

## 2019-11-13 DIAGNOSIS — M170 Bilateral primary osteoarthritis of knee: Secondary | ICD-10-CM | POA: Diagnosis not present

## 2019-11-13 DIAGNOSIS — Z7984 Long term (current) use of oral hypoglycemic drugs: Secondary | ICD-10-CM | POA: Diagnosis not present

## 2019-11-13 DIAGNOSIS — E1165 Type 2 diabetes mellitus with hyperglycemia: Secondary | ICD-10-CM | POA: Diagnosis not present

## 2019-11-20 DIAGNOSIS — N83201 Unspecified ovarian cyst, right side: Secondary | ICD-10-CM | POA: Diagnosis not present

## 2019-11-30 DIAGNOSIS — M170 Bilateral primary osteoarthritis of knee: Secondary | ICD-10-CM | POA: Diagnosis not present

## 2019-12-12 ENCOUNTER — Other Ambulatory Visit: Payer: Self-pay | Admitting: Family Medicine

## 2019-12-12 DIAGNOSIS — J301 Allergic rhinitis due to pollen: Secondary | ICD-10-CM

## 2019-12-16 DIAGNOSIS — Z03818 Encounter for observation for suspected exposure to other biological agents ruled out: Secondary | ICD-10-CM | POA: Diagnosis not present

## 2019-12-16 DIAGNOSIS — J329 Chronic sinusitis, unspecified: Secondary | ICD-10-CM | POA: Diagnosis not present

## 2019-12-16 DIAGNOSIS — R059 Cough, unspecified: Secondary | ICD-10-CM | POA: Diagnosis not present

## 2019-12-16 DIAGNOSIS — H109 Unspecified conjunctivitis: Secondary | ICD-10-CM | POA: Diagnosis not present

## 2019-12-21 DIAGNOSIS — R5383 Other fatigue: Secondary | ICD-10-CM | POA: Diagnosis not present

## 2019-12-21 DIAGNOSIS — M25569 Pain in unspecified knee: Secondary | ICD-10-CM | POA: Diagnosis not present

## 2020-01-09 ENCOUNTER — Ambulatory Visit (HOSPITAL_COMMUNITY)
Admission: EM | Admit: 2020-01-09 | Discharge: 2020-01-09 | Disposition: A | Discharge status: Home / Self Care | Payer: Medicare HMO | Attending: Family Medicine | Admitting: Family Medicine

## 2020-01-09 ENCOUNTER — Encounter (HOSPITAL_COMMUNITY): Payer: Self-pay | Admitting: *Deleted

## 2020-01-09 ENCOUNTER — Other Ambulatory Visit: Payer: Self-pay

## 2020-01-09 DIAGNOSIS — R350 Frequency of micturition: Secondary | ICD-10-CM | POA: Diagnosis not present

## 2020-01-09 DIAGNOSIS — R300 Dysuria: Secondary | ICD-10-CM | POA: Diagnosis not present

## 2020-01-09 LAB — POCT URINALYSIS DIPSTICK, ED / UC
Bilirubin Urine: NEGATIVE
Glucose, UA: NEGATIVE mg/dL
Ketones, ur: NEGATIVE mg/dL
Nitrite: NEGATIVE
Protein, ur: 30 mg/dL — AB
Specific Gravity, Urine: 1.02 (ref 1.005–1.030)
Urobilinogen, UA: 1 mg/dL (ref 0.0–1.0)
pH: 6.5 (ref 5.0–8.0)

## 2020-01-09 MED ORDER — CEPHALEXIN 500 MG PO CAPS: 500.0000 mg | ORAL_CAPSULE | Freq: Two times a day (BID) | ORAL | 0 refills | Status: DC

## 2020-01-09 NOTE — ED Triage Notes (Signed)
Pt reports ABD bloating,dysuria and frequency started last night

## 2020-01-09 NOTE — ED Triage Notes (Signed)
Pt wants a apaer RX because her pharmacy will be closed

## 2020-01-12 LAB — URINE CULTURE: Culture: >=100000 — AB

## 2020-01-12 NOTE — ED Provider Notes (Signed)
  [name]    ASSESSMENT & PLAN:  1. Dysuria   2. Urinary frequency     Begin: Meds ordered this encounter  Medications  . cephALEXin (KEFLEX) 500 MG capsule    Sig: Take 1 capsule (500 mg total) by mouth 2 (two) times daily.    Dispense:  10 capsule    Refill:  0    No signs of pyelonephritis. Discussed. Urine culture sent. Will follow up with her PCP or here if not showing improvement over the next 48 hours, sooner if needed.  Outlined signs and symptoms indicating need for more acute intervention. Patient verbalized understanding. After Visit Summary given.  SUBJECTIVE:  Dana Nielsen is a 75 y.o. female who complains of urinary frequency, urgency and dysuria for the past 1 day. Without associated flank pain, fever, chills, vaginal discharge or bleeding. Gross hematuria: not present. No specific aggravating or alleviating factors reported. No LE edema. Normal PO intake without n/v/d. Without specific abdominal pain. Ambulatory without difficulty. OTC treatment: none.  LMP: No LMP recorded. Patient is postmenopausal.   OBJECTIVE:  Vitals:   01/09/20 1849  BP: (!) 142/89  Pulse: 83  Resp: 18  Temp: 98.1 F (36.7 C)  TempSrc: Oral   General appearance: alert; no distress HENT: oropharynx: moist Lungs: unlabored respirations Abdomen: soft, non-tender Back: no CVA tenderness Extremities: no edema; symmetrical with no gross deformities Skin: warm and dry Neurologic: normal gait Psychological: alert and cooperative; normal mood and affect    Allergies  Allergen Reactions  . Lidocaine Shortness Of Breath    Past Medical History:  Diagnosis Date  . Diabetes mellitus type 2, diet-controlled ([place])   . HLD (hyperlipidemia)   . Hypothyroidism   . Iron deficiency anemia    Social History   Socioeconomic History  . Marital status: Widowed    Spouse name: Not on file  . Number of children: 14  . Years of education: Not on file  . Highest  education level: Not on file  Occupational History  . Not on file  Tobacco Use  . Smoking status: Never Smoker  . Smokeless tobacco: Never Used  Vaping Use  . Vaping Use: Never used  Substance and Sexual Activity  . Alcohol use: Yes    Comment: Rarely  . Drug use: Never  . Sexual activity: Not on file  Other Topics Concern  . Not on file  Social History Narrative  . Not on file   Social Determinants of Health   Financial Resource Strain: Not on file  Food Insecurity: Not on file  Transportation Needs: Not on file  Physical Activity: Not on file  Stress: Not on file  Social Connections: Not on file  Intimate Partner Violence: Not on file   Family History  Problem Relation Age of Onset  . Multiple myeloma Mother   . Heart attack Father   . Lung cancer Sister        Vanessa Kick, MD 01/12/20 1040

## 2020-01-18 ENCOUNTER — Other Ambulatory Visit: Payer: Self-pay

## 2020-01-18 ENCOUNTER — Inpatient Hospital Stay: Payer: Medicare HMO | Attending: Hematology and Oncology | Admitting: Hematology and Oncology

## 2020-01-18 ENCOUNTER — Inpatient Hospital Stay: Payer: Medicare HMO

## 2020-01-18 ENCOUNTER — Other Ambulatory Visit: Payer: Self-pay | Admitting: Hematology and Oncology

## 2020-01-18 ENCOUNTER — Encounter: Payer: Self-pay | Admitting: Hematology and Oncology

## 2020-01-18 VITALS — BP 153/69 | HR 96 | Temp 97.8°F | Resp 18 | Ht 62.0 in | Wt 200.9 lb

## 2020-01-18 DIAGNOSIS — Z79899 Other long term (current) drug therapy: Secondary | ICD-10-CM | POA: Diagnosis not present

## 2020-01-18 DIAGNOSIS — IMO0001 Reserved for inherently not codable concepts without codable children: Secondary | ICD-10-CM

## 2020-01-18 DIAGNOSIS — E119 Type 2 diabetes mellitus without complications: Secondary | ICD-10-CM | POA: Diagnosis not present

## 2020-01-18 DIAGNOSIS — Z789 Other specified health status: Secondary | ICD-10-CM | POA: Diagnosis not present

## 2020-01-18 DIAGNOSIS — Z8249 Family history of ischemic heart disease and other diseases of the circulatory system: Secondary | ICD-10-CM | POA: Insufficient documentation

## 2020-01-18 DIAGNOSIS — Z801 Family history of malignant neoplasm of trachea, bronchus and lung: Secondary | ICD-10-CM | POA: Diagnosis not present

## 2020-01-18 DIAGNOSIS — Z7984 Long term (current) use of oral hypoglycemic drugs: Secondary | ICD-10-CM | POA: Insufficient documentation

## 2020-01-18 DIAGNOSIS — E785 Hyperlipidemia, unspecified: Secondary | ICD-10-CM | POA: Diagnosis not present

## 2020-01-18 DIAGNOSIS — Z807 Family history of other malignant neoplasms of lymphoid, hematopoietic and related tissues: Secondary | ICD-10-CM | POA: Insufficient documentation

## 2020-01-18 DIAGNOSIS — D508 Other iron deficiency anemias: Secondary | ICD-10-CM | POA: Diagnosis not present

## 2020-01-18 DIAGNOSIS — D500 Iron deficiency anemia secondary to blood loss (chronic): Secondary | ICD-10-CM

## 2020-01-18 DIAGNOSIS — E039 Hypothyroidism, unspecified: Secondary | ICD-10-CM | POA: Insufficient documentation

## 2020-01-18 LAB — CBC WITH DIFFERENTIAL (CANCER CENTER ONLY)
Abs Immature Granulocytes: 0.03 10*3/uL (ref 0.00–0.07)
Basophils Absolute: 0.1 10*3/uL (ref 0.0–0.1)
Basophils Relative: 1 %
Eosinophils Absolute: 0.3 10*3/uL (ref 0.0–0.5)
Eosinophils Relative: 4 %
HCT: 39 % (ref 36.0–46.0)
Hemoglobin: 12.7 g/dL (ref 12.0–15.0)
Immature Granulocytes: 0 %
Lymphocytes Relative: 33 %
Lymphs Abs: 2.3 10*3/uL (ref 0.7–4.0)
MCH: 29.6 pg (ref 26.0–34.0)
MCHC: 32.6 g/dL (ref 30.0–36.0)
MCV: 90.9 fL (ref 80.0–100.0)
Monocytes Absolute: 0.6 10*3/uL (ref 0.1–1.0)
Monocytes Relative: 9 %
Neutro Abs: 3.7 10*3/uL (ref 1.7–7.7)
Neutrophils Relative %: 53 %
Platelet Count: 227 10*3/uL (ref 150–400)
RBC: 4.29 MIL/uL (ref 3.87–5.11)
RDW: 13.3 % (ref 11.5–15.5)
WBC Count: 7 10*3/uL (ref 4.0–10.5)
nRBC: 0 % (ref 0.0–0.2)

## 2020-01-18 LAB — CMP (CANCER CENTER ONLY)
ALT: 19 U/L (ref 0–44)
AST: 19 U/L (ref 15–41)
Albumin: 4 g/dL (ref 3.5–5.0)
Alkaline Phosphatase: 60 U/L (ref 38–126)
Anion gap: 8 (ref 5–15)
BUN: 16 mg/dL (ref 8–23)
CO2: 27 mmol/L (ref 22–32)
Calcium: 10 mg/dL (ref 8.9–10.3)
Chloride: 106 mmol/L (ref 98–111)
Creatinine: 0.81 mg/dL (ref 0.44–1.00)
GFR, Estimated: >60 mL/min (ref >60)
Glucose, Bld: 108 mg/dL — ABNORMAL HIGH (ref 70–99)
Potassium: 4.1 mmol/L (ref 3.5–5.1)
Sodium: 141 mmol/L (ref 135–145)
Total Bilirubin: 0.5 mg/dL (ref 0.3–1.2)
Total Protein: 7.6 g/dL (ref 6.5–8.1)

## 2020-01-18 LAB — IRON AND TIBC
Iron: 102 ug/dL (ref 41–142)
Saturation Ratios: 31 % (ref 21–57)
TIBC: 329 ug/dL (ref 236–444)
UIBC: 227 ug/dL (ref 120–384)

## 2020-01-18 LAB — RETIC PANEL
Immature Retic Fract: 9.8 % (ref 2.3–15.9)
RBC.: 4.35 MIL/uL (ref 3.87–5.11)
Retic Count, Absolute: 71.3 10*3/uL (ref 19.0–186.0)
Retic Ct Pct: 1.6 % (ref 0.4–3.1)
Reticulocyte Hemoglobin: 34.7 pg (ref >27.9)

## 2020-01-18 LAB — FERRITIN: Ferritin: 361 ng/mL — ABNORMAL HIGH (ref 11–307)

## 2020-01-18 NOTE — Progress Notes (Signed)
[place] [phone]   [phone]  PROGRESS NOTE  Patient Care Team: Dana Smoker, MD as PCP - General (Family Medicine)  Hematological/Oncological History #Iron Deficiency Anemia 2/2 to GI Bleeding 1) 2019: presented to [facility] in [facility] with symptoms of anemia. Found to have Hgb 5.0. EGD/colon/capsule reported found no source of bleed 2) 01/18/2019: establish care with Dr. Lorenso Nielsen  3) 03/08/2019: admitted for acute GI bleed. Endoscopy revealed an actively bleeding Dieulafoy lesion that was clipped. Received IV feraheme 538m x 1 dose during admission 4) 03/15/2019: received dose 2 of IV feraheme 5158min outpatient setting. 5) 04/13/2019: WBC 8.6, Hgb 12.2, MCV 92.1, Plt 216 6) 07/19/2019: WBC 7.1, Hgb 12.2, MCV 90.6, Plt 226  Interval History:  [name]75o. female with medical history significant for Dieulafoy lesion bleeding with iron deficiency anemia who presents for a follow up visit. The patient's last visit was on 07/19/2019. In the interim since the last visit she has had no further hospitalizations/ED visits or concern for bleeding.  On exam today Dana Nielsen notes she is overall been well in the interim since her last visit.  She notes that she is not limited due to shortness of breath, however she is mostly limited due to pain in her knees.  She reports that she has not been having any issues with shortness of breath, lightheadedness, or dizziness.  She reports that she does not build up an appetite as she is not as physically active as she would like to be.  She thinks that she may be having some "COVID depression".  Otherwise she is happy to see that her hemoglobin is stable.  She denies any fevers, chills, sweats, nausea, vomiting or diarrhea.  A full 10 point ROS is listed below.  MEDICAL HISTORY:  Past Medical History:  Diagnosis Date  . [place]  . HLD (hyperlipidemia)   .  Hypothyroidism   . Iron deficiency anemia     SURGICAL HISTORY: Past Surgical History:  Procedure Laterality Date  . arthroscopic knee    . BIOPSY  03/08/2019   Procedure: BIOPSY;  Surgeon: Dana BullionDO;  [facility];  Service: Gastroenterology;;  . C sections     x 7  . [place]  . ENTEROSCOPY N/A 03/08/2019   Procedure: ENTEROSCOPY;  Surgeon: Dana BullionDO;  Location[facility];  Service: Gastroenterology;  Laterality: N/A;  . HEMOSTASIS CLIP PLACEMENT  03/08/2019   Procedure: HEMOSTASIS CLIP PLACEMENT;  Surgeon: Dana BullionDO;  [facility];  Service: Gastroenterology;;  . SULia FoyerATTOO INJECTION  03/08/2019   Procedure: SUBMUCOSAL TATTOO INJECTION;  Surgeon: Dana BullionDO;  [facility];  Service: Gastroenterology;;  . TONSILLECTOMY    . WISDOM TOOTH EXTRACTION    . WRIST SURGERY      SOCIAL HISTORY: Social History   Socioeconomic History  . Marital status: Widowed    Spouse name: Not on file  . Number of children: 9 65. Years of education: Not on file  . Highest education level: Not on file  Occupational History  . Not on file  Tobacco Use  . Smoking status: Never Nielsen  . Smokeless tobacco: Never Used  Vaping Use  . Vaping Use: Never used  Substance and Sexual Activity  . Alcohol use: Yes    Comment: Rarely  . Drug  use: Never  . Sexual activity: Not on file  Other Topics Concern  . Not on file  Social History Narrative  . Not on file   Social Determinants of Health   Financial Resource Strain: Not on file  Food Insecurity: Not on file  Transportation Needs: Not on file  Physical Activity: Not on file  Stress: Not on file  Social Connections: Not on file  Intimate Partner Violence: Not on file    FAMILY HISTORY: Family History  Problem Relation Age of Onset  . Multiple myeloma Mother   . Heart attack Father   . Lung cancer  Sister     ALLERGIES:  is allergic to lidocaine.  MEDICATIONS:  Current Outpatient Medications  Medication Sig Dispense Refill  . gabapentin (NEURONTIN) 100 MG capsule 1-3 capsules nightly as needed    . ACCU-CHEK AVIVA PLUS test strip     . EQ ALLERGY RELIEF, CETIRIZINE, 10 MG tablet Take 1 tablet by mouth once daily 90 tablet 0  . furosemide (LASIX) 20 MG tablet Take 20 mg by mouth daily.    Marland Kitchen losartan (COZAAR) 25 MG tablet Take 25 mg by mouth daily.    . metFORMIN (GLUCOPHAGE) 1000 MG tablet Take 1,000 mg by mouth 2 (two) times daily with a meal.    . pantoprazole (PROTONIX) 40 MG tablet Take 1 tablet (40 mg total) by mouth 2 (two) times daily. 112 tablet 0  . simvastatin (ZOCOR) 40 MG tablet Take 1 tablet (40 mg total) by mouth daily at 6 PM. 90 tablet 3  . sucralfate (CARAFATE) 1 g tablet Take 1 tablet (1 g total) by mouth 4 (four) times daily. Dissolved in 15ML water 120 tablet 0  . SYNTHROID 100 MCG tablet Take 100 mcg by mouth daily before breakfast.    . traZODone (DESYREL) 100 MG tablet TAKE 1 TABLET BY MOUTH AT BEDTIME 90 tablet 0  . venlafaxine (EFFEXOR) 75 MG tablet Take 75 mg by mouth daily.     . vitamin B-12 (CYANOCOBALAMIN) 100 MCG tablet 1 tablet     No current facility-administered medications for this visit.    REVIEW OF SYSTEMS:   Constitutional: ( - ) fevers, ( - )  chills , ( - ) night sweats Eyes: ( - ) blurriness of vision, ( - ) double vision, ( - ) watery eyes Ears, nose, mouth, throat, and face: ( - ) mucositis, ( - ) sore throat Respiratory: ( - ) cough, ( - ) dyspnea, ( - ) wheezes Cardiovascular: ( - ) palpitation, ( - ) chest discomfort, ( - ) lower extremity swelling Gastrointestinal:  ( - ) nausea, ( - ) heartburn, ( - ) change in bowel habits Skin: ( - ) abnormal skin rashes Lymphatics: ( - ) new lymphadenopathy, ( - ) easy bruising Neurological: ( - ) numbness, ( - ) tingling, ( - ) new weaknesses Behavioral/Psych: ( - ) mood change, ( - ) new  changes  All other systems were reviewed with the patient and are negative.  PHYSICAL EXAMINATION: ECOG PERFORMANCE STATUS: 1 - Symptomatic but completely ambulatory  Vitals:   01/18/20 1129  BP: (!) 153/69  Pulse: 96  Resp: 18  Temp: 97.8 F (36.6 C)  SpO2: 97%   Filed Weights   01/18/20 1129  Weight: 200 lb 14.4 oz (91.1 kg)    GENERAL:well appearing elderly Caucasian female alert, no distress and comfortable SKIN: skin color, texture, turgor are normal, no rashes or significant lesions EYES:  conjunctiva are pink and non-injected, sclera clear LUNGS: clear to auscultation and percussion with normal breathing effort HEART: regular rate & rhythm and no murmurs and no lower extremity edema Musculoskeletal: no cyanosis of digits and no clubbing  PSYCH: alert & oriented x 3, fluent speech NEURO: no focal motor/sensory deficits  LABORATORY DATA:  I have reviewed the data as listed CBC Latest Ref Rng & Units 01/18/2020 10/19/2019 07/19/2019  WBC 4.0 - 10.5 K/uL 7.0 6.9 7.1  Hemoglobin 12.0 - 15.0 g/dL 12.7 12.3 12.2  Hematocrit 36.0 - 46.0 % 39.0 38.5 37.8  Platelets 150 - 400 K/uL 227 217 226    CMP Latest Ref Rng & Units 01/18/2020 07/19/2019 04/13/2019  Glucose 70 - 99 mg/dL 108(H) 105(H) 118(H)  BUN 8 - 23 mg/dL _0 Creatinine 0.44 - 1.00 mg/dL 0.81 0.81 0.84  Sodium 135 - 145 mmol/L 141 146(H) 144  Potassium 3.5 - 5.1 mmol/L 4.1 4.0 4.0  Chloride 98 - 111 mmol/L 106 109 104  CO2 22 - 32 mmol/L _1 Calcium 8.9 - 10.3 mg/dL 10.0 9.7 9.6  Total Protein 6.5 - 8.1 g/dL 7.6 7.3 7.4  Total Bilirubin 0.3 - 1.2 mg/dL 0.5 0.3 0.4  Alkaline Phos 38 - 126 U/L 60 61 72  AST 15 - 41 U/L _2 ALT 0 - 44 U/L _3 RADIOGRAPHIC STUDIES: No results found.  ASSESSMENT & PLAN Dana Nielsen 75 y.o. female with medical history significant for Dieulafoy lesion bleeding with iron deficiency anemia who presents for a follow up visit. After review the labs,  discussion with the patient, and physical examination it appears that the patient has stable hemoglobin over the last several months. She has no evidence of GI bleeding and her iron stores appear replete at this time. Given these findings I think would be safe to follow-up with her in approximately 6 months time with an interval 18-monthlab checkup to assure that she has not slowly losing blood. As always we are happy to see the patient back sooner than those visits if she is feeling more symptomatic or has concerns for GI bleeding. Overall she is stable at this time.  #Iron Deficiency Anemia 2/2 to GI Bleeding --today will recheck iron panel, ferritin, reticulocyte panel, and CBC --patient is s/p IV iron in in March 2021 --no evidence of bleeding on today's exam. Hgb is currently stably within the normal range --plan for RTC in 6 months with interval lab check in 3 months time. We can always see her sooner if she were to develop new symptoms or concern for dropping Hgb.   #Jehovah's Witness --the patient is not to receive blood products under any circumstances, per her request. We have copied and uploaded her Blood Contract which she carries into her wallet into our system. --in the event the patient is admitted with severe anemia or bleed please make the Hematology service aware so that we may provide bloodless options for support.   No orders of the defined types were placed in this encounter.  All questions were answered. The patient knows to call the clinic with any problems, questions or concerns.  A total of more than 30 minutes were spent on this encounter and over half of that time was spent on counseling and coordination of care as outlined above.   JLedell Peoples MD Department of Hematology/Oncology [facility] IncPhone: [phone]  Pager: [phone] Email: [email or web address]  01/18/2020 4:27 PM

## 2020-01-22 ENCOUNTER — Encounter: Payer: Self-pay | Admitting: Hematology and Oncology

## 2020-03-08 ENCOUNTER — Ambulatory Visit (HOSPITAL_COMMUNITY)
Admission: EM | Admit: 2020-03-08 | Discharge: 2020-03-08 | Disposition: A | Discharge status: Home / Self Care | Payer: Medicare HMO | Attending: Family Medicine | Admitting: Family Medicine

## 2020-03-08 ENCOUNTER — Encounter (HOSPITAL_COMMUNITY): Payer: Self-pay | Admitting: *Deleted

## 2020-03-08 DIAGNOSIS — L538 Other specified erythematous conditions: Secondary | ICD-10-CM

## 2020-03-08 MED ORDER — CEPHALEXIN 500 MG PO CAPS: 500.0000 mg | ORAL_CAPSULE | Freq: Two times a day (BID) | ORAL | 0 refills | Status: DC

## 2020-03-08 MED ORDER — PREDNISONE 20 MG PO TABS: 40.0000 mg | ORAL_TABLET | Freq: Every day | ORAL | 0 refills | Status: DC

## 2020-03-08 NOTE — Discharge Instructions (Addendum)
Her blood sugars carefully while taking the prednisone.  Follow-up with PCP as scheduled on Monday.

## 2020-03-08 NOTE — ED Provider Notes (Signed)
[place]    CSN: [id number] Arrival date & time: 03/08/20  1720      History   Chief Complaint Chief Complaint  Patient presents with  . Eye Problem    HPI Dana Nielsen is a 75 y.o. female.   Pt reports starting yesterday with left periorbital redness and swelling, with progressive worsening today.  States redness and swelling extending further down into face now.  Denies fevers.  Denies any change in vision, or any problems with eyeball.  Patient presenting today for redness and swelling in left periorbital area that has progressively worsened since yesterday.  She states some itching, minimal irritation to the area but no pain.  No involvement of the globe of the eye, no vision changes, no injury, no headaches, fever, new exposures, history of the same.  So far has not tried anything over-the-counter for symptoms aside from cool compresses.  Does take antihistamine daily for seasonal allergies.     Past Medical History:  Diagnosis Date  . Diabetes mellitus type 2, diet-controlled ([place])   . HLD (hyperlipidemia)   . Hypothyroidism   . Iron deficiency anemia     Patient Active Problem List   Diagnosis Date Noted  . Seasonal allergic rhinitis due to pollen 04/06/2019  . Arterial hypotension 03/20/2019  . Labyrinthitis 03/20/2019  . Iron deficiency anemia due to chronic blood loss 03/09/2019  . Dieulafoy lesion of duodenum   . Gastric ulcer without hemorrhage or perforation   . Gastritis and gastroduodenitis   . Hiatal hernia   . GI bleeding 03/07/2019  . Blood loss anemia 03/07/2019  . Melena 03/05/2019  . Primary insomnia 12/14/2018  . Patient is Jehovah's Witness 12/14/2018  . Iron deficiency anemia 12/14/2018  . Hypothyroidism 12/14/2018  . Depression, major, single episode, mild ([place]) 12/14/2018  . Controlled type 2 diabetes mellitus with complication, without long-term current use of insulin ([place]) 12/14/2018  . Essential hypertension  12/14/2018  . Vitamin D deficiency 12/14/2018  . Elevated cholesterol 12/14/2018    Past Surgical History:  Procedure Laterality Date  . arthroscopic knee    . BIOPSY  03/08/2019   Procedure: BIOPSY;  Surgeon: Lavena Bullion, DO;  [facility];  Service: Gastroenterology;;  . C sections     x 7  . COLONOSCOPY  2019   x2 At the [place] and in [place]  . ENTEROSCOPY N/A 03/08/2019   Procedure: ENTEROSCOPY;  Surgeon: Lavena Bullion, DO;  [facility];  Service: Gastroenterology;  Laterality: N/A;  . HEMOSTASIS CLIP PLACEMENT  03/08/2019   Procedure: HEMOSTASIS CLIP PLACEMENT;  Surgeon: Lavena Bullion, DO;  [facility];  Service: Gastroenterology;;  . Lia Foyer TATTOO INJECTION  03/08/2019   Procedure: SUBMUCOSAL TATTOO INJECTION;  Surgeon: Lavena Bullion, DO;  [facility];  Service: Gastroenterology;;  . TONSILLECTOMY    . WISDOM TOOTH EXTRACTION    . WRIST SURGERY      OB History   No obstetric history on file.      Home Medications    Prior to Admission medications   Medication Sig Start Date End Date Taking? Authorizing Provider  cephALEXin (KEFLEX) 500 MG capsule Take 1 capsule (500 mg total) by mouth 2 (two) times daily. 03/08/20  Yes Volney American, PA-C  furosemide (LASIX) 20 MG tablet Take 20 mg by mouth daily.   Yes [provider]  gabapentin (NEURONTIN) 100 MG capsule 1-3 capsules nightly as needed 10/12/19  Yes [provider]  losartan (COZAAR) 25 MG tablet Take 25 mg by mouth daily.   Yes [provider]  metFORMIN (GLUCOPHAGE) 1000 MG tablet Take 1,000 mg by mouth 2 (two) times daily with a meal.   Yes [provider]  pantoprazole (PROTONIX) 40 MG tablet Take 1 tablet (40 mg total) by mouth 2 (two) times daily. 03/09/19 05/04/19 Yes [place] Eric J, DO  predniSONE (DELTASONE) 20 MG tablet Take 2 tablets (40 mg total) by mouth daily with breakfast. 03/08/20  Yes  Volney American, PA-C  simvastatin (ZOCOR) 40 MG tablet Take 1 tablet (40 mg total) by mouth daily at 6 PM. 04/06/19  Yes Libby Maw, MD  SYNTHROID 100 MCG tablet Take 100 mcg by mouth daily before breakfast.   Yes [provider]  traZODone (DESYREL) 100 MG tablet TAKE 1 TABLET BY MOUTH AT BEDTIME 09/20/19  Yes Libby Maw, MD  venlafaxine [facility]) 75 MG tablet Take 75 mg by mouth daily.    Yes [provider]  vitamin B-12 (CYANOCOBALAMIN) 100 MCG tablet 1 tablet   Yes [provider]  ACCU-CHEK AVIVA PLUS test strip  03/09/19   [provider]  EQ ALLERGY RELIEF, CETIRIZINE, 10 MG tablet Take 1 tablet by mouth once daily 12/12/19   Dutch Quint B, FNP  sucralfate (CARAFATE) 1 g tablet Take 1 tablet (1 g total) by mouth 4 (four) times daily. Dissolved in 15ML water 03/09/19 04/08/19  [place] Eric J, DO    Family History Family History  Problem Relation Age of Onset  . Multiple myeloma Mother   . Heart attack Father   . Lung cancer Sister     Social History Social History   Tobacco Use  . Smoking status: Never Smoker  . Smokeless tobacco: Never Used  Vaping Use  . Vaping Use: Never used  Substance Use Topics  . Alcohol use: Not Currently  . Drug use: Never     Allergies   Lidocaine   Review of Systems Review of Systems Per HPI  Physical Exam Triage Vital Signs ED Triage Vitals  Enc Vitals Group     BP 03/08/20 1739 (!) 154/71     Pulse Rate 03/08/20 1739 86     Resp 03/08/20 1739 18     Temp 03/08/20 1739 97.9 F (36.6 C)     Temp Source 03/08/20 1739 Oral     SpO2 03/08/20 1739 97 %     Weight --      Height --      Head Circumference --      Peak Flow --      Pain Score 03/08/20 1743 0     Pain Loc --      Pain Edu? --      Excl. in [place]? --    No data found.  Updated Vital Signs BP (!) 154/71   Pulse 86   Temp 97.9 F (36.6 C) (Oral)   Resp 18   SpO2 97%   Visual Acuity Right Eye  Distance: 20/20 Left Eye Distance: 20/40 Bilateral Distance: 20/20  Right Eye Near:   Left Eye Near:    Bilateral Near:     Physical Exam Vitals and nursing note reviewed.  Constitutional:      Appearance: Normal appearance. She is not ill-appearing.  HENT:     Head: Atraumatic.     Mouth/Throat:     Mouth: Mucous membranes are moist.  Eyes:     General:  Right eye: No discharge.        Left eye: No discharge.     Extraocular Movements: Extraocular movements intact.     Conjunctiva/sclera: Conjunctivae normal.     Pupils: Pupils are equal, round, and reactive to light.  Cardiovascular:     Rate and Rhythm: Normal rate and regular rhythm.     Heart sounds: Normal heart sounds.  Pulmonary:     Effort: Pulmonary effort is normal.     Breath sounds: Normal breath sounds.  Musculoskeletal:        General: Normal range of motion.     Cervical back: Normal range of motion and neck supple.  Lymphadenopathy:     Cervical: No cervical adenopathy.  Skin:    General: Skin is warm and dry.     Findings: Rash present.     Comments: Fairly well demarcated area of erythema and mild edema left periorbital region extending down to mid cheek.  Nontender to palpation.  No drainage or peeling of skin.  Neurological:     Mental Status: She is alert and oriented to person, place, and time.  Psychiatric:        Mood and Affect: Mood normal.        Thought Content: Thought content normal.        Judgment: Judgment normal.      UC Treatments / Results  Labs (all labs ordered are listed, but only abnormal results are displayed) Labs Reviewed - No data to display  EKG   Radiology No results found.  Procedures Procedures (including critical care time)  Medications Ordered in UC Medications - No data to display  Initial Impression / Assessment and Plan / UC Course  I have reviewed the triage vital signs and the nursing notes.  Pertinent labs & imaging results that were  available during my care of the patient were reviewed by me and considered in my medical decision making (see chart for details).     Patient is afebrile and in no distress today.  Difficult to discern at this point if bacterial infection versus allergic reaction.  Will treat with a short burst of prednisone, antihistamines, course of Keflex.  Cool compresses to the area.  She is scheduled for appointment with PCP on Monday for other reasons and will use this also as a follow-up to this issue.  Discussed strict return precautions if worsening in the meantime.  Final Clinical Impressions(s) / UC Diagnoses   Final diagnoses:  Periorbital erythema     Discharge Instructions     Her blood sugars carefully while taking the prednisone.  Follow-up with PCP as scheduled on Monday.    ED Prescriptions    Medication Sig Dispense Auth. Provider   predniSONE (DELTASONE) 20 MG tablet Take 2 tablets (40 mg total) by mouth daily with breakfast. 6 tablet Volney American, PA-C   cephALEXin (KEFLEX) 500 MG capsule Take 1 capsule (500 mg total) by mouth 2 (two) times daily. 14 capsule Volney American, [place]     PDMP not reviewed this encounter.   Volney American, [place] 03/09/20 1017

## 2020-03-08 NOTE — ED Triage Notes (Signed)
Pt reports starting yesterday with left periorbital redness and swelling, with progressive worsening today.  States redness and swelling extending further down into face now.  Denies fevers.  Denies any change in vision, or any problems with eyeball.

## 2020-03-10 DIAGNOSIS — R600 Localized edema: Secondary | ICD-10-CM | POA: Diagnosis not present

## 2020-03-10 DIAGNOSIS — R0781 Pleurodynia: Secondary | ICD-10-CM | POA: Diagnosis not present

## 2020-03-10 DIAGNOSIS — S299XXA Unspecified injury of thorax, initial encounter: Secondary | ICD-10-CM | POA: Diagnosis not present

## 2020-03-11 ENCOUNTER — Other Ambulatory Visit: Payer: Self-pay | Admitting: Family Medicine

## 2020-03-11 ENCOUNTER — Ambulatory Visit
Admission: RE | Admit: 2020-03-11 | Discharge: 2020-03-11 | Disposition: A | Discharge status: Home / Self Care | Payer: Medicare HMO | Source: Ambulatory Visit | Attending: Family Medicine | Admitting: Family Medicine

## 2020-03-11 DIAGNOSIS — R0781 Pleurodynia: Secondary | ICD-10-CM

## 2020-04-28 DIAGNOSIS — Z961 Presence of intraocular lens: Secondary | ICD-10-CM | POA: Diagnosis not present

## 2020-04-28 DIAGNOSIS — E1165 Type 2 diabetes mellitus with hyperglycemia: Secondary | ICD-10-CM | POA: Diagnosis not present

## 2020-04-28 DIAGNOSIS — E7800 Pure hypercholesterolemia, unspecified: Secondary | ICD-10-CM | POA: Diagnosis not present

## 2020-04-28 DIAGNOSIS — E039 Hypothyroidism, unspecified: Secondary | ICD-10-CM | POA: Diagnosis not present

## 2020-04-28 DIAGNOSIS — D500 Iron deficiency anemia secondary to blood loss (chronic): Secondary | ICD-10-CM | POA: Diagnosis not present

## 2020-04-28 DIAGNOSIS — Z0000 Encounter for general adult medical examination without abnormal findings: Secondary | ICD-10-CM | POA: Diagnosis not present

## 2020-04-28 DIAGNOSIS — R0781 Pleurodynia: Secondary | ICD-10-CM | POA: Diagnosis not present

## 2020-04-28 DIAGNOSIS — H5213 Myopia, bilateral: Secondary | ICD-10-CM | POA: Diagnosis not present

## 2020-04-28 DIAGNOSIS — I10 Essential (primary) hypertension: Secondary | ICD-10-CM | POA: Diagnosis not present

## 2020-04-28 DIAGNOSIS — H25812 Combined forms of age-related cataract, left eye: Secondary | ICD-10-CM | POA: Diagnosis not present

## 2020-04-28 DIAGNOSIS — M2041 Other hammer toe(s) (acquired), right foot: Secondary | ICD-10-CM | POA: Diagnosis not present

## 2020-05-02 DIAGNOSIS — H52223 Regular astigmatism, bilateral: Secondary | ICD-10-CM | POA: Diagnosis not present

## 2020-05-02 DIAGNOSIS — H524 Presbyopia: Secondary | ICD-10-CM | POA: Diagnosis not present

## 2020-05-09 DIAGNOSIS — R059 Cough, unspecified: Secondary | ICD-10-CM | POA: Diagnosis not present

## 2020-05-09 DIAGNOSIS — R509 Fever, unspecified: Secondary | ICD-10-CM | POA: Diagnosis not present

## 2020-05-09 DIAGNOSIS — Z03818 Encounter for observation for suspected exposure to other biological agents ruled out: Secondary | ICD-10-CM | POA: Diagnosis not present

## 2020-05-12 DIAGNOSIS — M542 Cervicalgia: Secondary | ICD-10-CM | POA: Diagnosis not present

## 2020-05-12 DIAGNOSIS — M546 Pain in thoracic spine: Secondary | ICD-10-CM | POA: Diagnosis not present

## 2020-05-12 DIAGNOSIS — M25511 Pain in right shoulder: Secondary | ICD-10-CM | POA: Diagnosis not present

## 2020-05-14 DIAGNOSIS — M25511 Pain in right shoulder: Secondary | ICD-10-CM | POA: Diagnosis not present

## 2020-05-14 DIAGNOSIS — M542 Cervicalgia: Secondary | ICD-10-CM | POA: Diagnosis not present

## 2020-05-14 DIAGNOSIS — M2041 Other hammer toe(s) (acquired), right foot: Secondary | ICD-10-CM | POA: Diagnosis not present

## 2020-05-14 DIAGNOSIS — M546 Pain in thoracic spine: Secondary | ICD-10-CM | POA: Diagnosis not present

## 2020-05-22 DIAGNOSIS — H903 Sensorineural hearing loss, bilateral: Secondary | ICD-10-CM | POA: Diagnosis not present

## 2020-05-28 ENCOUNTER — Ambulatory Visit: Payer: Medicare HMO | Admitting: Podiatry

## 2020-06-11 DIAGNOSIS — H903 Sensorineural hearing loss, bilateral: Secondary | ICD-10-CM | POA: Diagnosis not present

## 2020-07-25 ENCOUNTER — Other Ambulatory Visit: Payer: Self-pay

## 2020-07-25 ENCOUNTER — Ambulatory Visit (HOSPITAL_COMMUNITY)
Admission: EM | Admit: 2020-07-25 | Discharge: 2020-07-26 | Disposition: A | Discharge status: Home / Self Care | Payer: Medicare HMO | Attending: Medical Oncology | Admitting: Medical Oncology

## 2020-07-25 ENCOUNTER — Encounter (HOSPITAL_COMMUNITY): Payer: Self-pay | Admitting: Emergency Medicine

## 2020-07-25 DIAGNOSIS — J029 Acute pharyngitis, unspecified: Secondary | ICD-10-CM | POA: Diagnosis not present

## 2020-07-25 DIAGNOSIS — R059 Cough, unspecified: Secondary | ICD-10-CM | POA: Diagnosis not present

## 2020-07-25 DIAGNOSIS — R0981 Nasal congestion: Secondary | ICD-10-CM

## 2020-07-25 DIAGNOSIS — Z20822 Contact with and (suspected) exposure to covid-19: Secondary | ICD-10-CM | POA: Diagnosis not present

## 2020-07-25 DIAGNOSIS — J069 Acute upper respiratory infection, unspecified: Secondary | ICD-10-CM | POA: Diagnosis not present

## 2020-07-25 DIAGNOSIS — H9209 Otalgia, unspecified ear: Secondary | ICD-10-CM | POA: Insufficient documentation

## 2020-07-25 DIAGNOSIS — R509 Fever, unspecified: Secondary | ICD-10-CM | POA: Diagnosis not present

## 2020-07-25 MED ORDER — FLUTICASONE PROPIONATE 50 MCG/ACT NA SUSP: 2.0000 | Freq: Every day | NASAL | 0 refills | Status: DC

## 2020-07-25 MED ORDER — BENZONATATE 100 MG PO CAPS: 100.0000 mg | ORAL_CAPSULE | Freq: Three times a day (TID) | ORAL | 0 refills | Status: DC

## 2020-07-25 NOTE — ED Provider Notes (Signed)
[place]    CSN: [id number] Arrival date & time: 07/25/20  1938      History   Chief Complaint Chief Complaint  Patient presents with   Otalgia   Fever   Sore Throat   Cough    HPI Zetta Stoneman is a 75 y.o. female.   HPI  Cold Symptoms: Patient reports that she has had cold symptoms of bilateral ear congestion, subjective fever, sore throat and dry cough over the past 3 days.  She states that she feels pretty miserable overall with her symptoms of body aches.  She reports that she was treating this just like abnormal cough and cold however her son recently started to have similar symptoms so she wanted to get checked out to see if this is more contagious.  She has been using over-the-counter allergy medication and Tylenol with some improvement.  She has been eating lots of chicken noodle soup.  She denies any chest pain, shortness of breath, vomiting or diarrhea.  Past Medical History:  Diagnosis Date   Diabetes mellitus type 2, diet-controlled ([place])    HLD (hyperlipidemia)    Hypothyroidism    Iron deficiency anemia     Patient Active Problem List   Diagnosis Date Noted   Seasonal allergic rhinitis due to pollen 04/06/2019   Arterial hypotension 03/20/2019   Labyrinthitis 03/20/2019   Iron deficiency anemia due to chronic blood loss 03/09/2019   Dieulafoy lesion of duodenum    Gastric ulcer without hemorrhage or perforation    Gastritis and gastroduodenitis    Hiatal hernia    GI bleeding 03/07/2019   Blood loss anemia 03/07/2019   Melena 03/05/2019   Primary insomnia 12/14/2018   Patient is Jehovah's Witness 12/14/2018   Iron deficiency anemia 12/14/2018   Hypothyroidism 12/14/2018   Depression, major, single episode, mild ([place]) 12/14/2018   Controlled type 2 diabetes mellitus with complication, without long-term current use of insulin ([place]) 12/14/2018   Essential hypertension 12/14/2018   Vitamin D deficiency 12/14/2018   Elevated  cholesterol 12/14/2018    Past Surgical History:  Procedure Laterality Date   arthroscopic knee     BIOPSY  03/08/2019   Procedure: BIOPSY;  Surgeon: Lavena Bullion, DO;  Location: [facility];  Service: Gastroenterology;;   C sections     x 7   COLONOSCOPY  2019   x2 At the [place] [facility] and in [place]   ENTEROSCOPY N/A 03/08/2019   Procedure: ENTEROSCOPY;  Surgeon: Lavena Bullion, DO;  Location: [facility];  Service: Gastroenterology;  Laterality: N/A;   HEMOSTASIS CLIP PLACEMENT  03/08/2019   Procedure: HEMOSTASIS CLIP PLACEMENT;  Surgeon: Lavena Bullion, DO;  Location: [facility];  Service: Gastroenterology;;   SUBMUCOSAL TATTOO INJECTION  03/08/2019   Procedure: SUBMUCOSAL TATTOO INJECTION;  Surgeon: Lavena Bullion, DO;  Location: [facility];  Service: Gastroenterology;;   TONSILLECTOMY     WISDOM TOOTH EXTRACTION     WRIST SURGERY      OB History   No obstetric history on file.      Home Medications    Prior to Admission medications   Medication Sig Start Date End Date Taking? Authorizing Provider  ACCU-CHEK AVIVA PLUS test strip  03/09/19   [provider]  cephALEXin (KEFLEX) 500 MG capsule Take 1 capsule (500 mg total) by mouth 2 (two) times daily. 03/08/20   Volney American, PA-C  EQ ALLERGY RELIEF, CETIRIZINE, 10 MG tablet Take 1 tablet by mouth once daily 12/12/19  Dutch Quint B, FNP  furosemide (LASIX) 20 MG tablet Take 20 mg by mouth daily.    [provider]  gabapentin (NEURONTIN) 100 MG capsule 1-3 capsules nightly as needed 10/12/19   [provider]  losartan (COZAAR) 25 MG tablet Take 25 mg by mouth daily.    [provider]  metFORMIN (GLUCOPHAGE) 1000 MG tablet Take 1,000 mg by mouth 2 (two) times daily with a meal.    [provider]  pantoprazole (PROTONIX) 40 MG tablet Take 1 tablet (40 mg total) by mouth 2 (two) times daily. 03/09/19 05/04/19  [place] Donnamarie Poag, DO  predniSONE  (DELTASONE) 20 MG tablet Take 2 tablets (40 mg total) by mouth daily with breakfast. 03/08/20   Volney American, PA-C  simvastatin (ZOCOR) 40 MG tablet Take 1 tablet (40 mg total) by mouth daily at 6 PM. 04/06/19   Libby Maw, MD  sucralfate (CARAFATE) 1 g tablet Take 1 tablet (1 g total) by mouth 4 (four) times daily. Dissolved in 15ML water 03/09/19 04/08/19  [place] Eric J, DO  SYNTHROID 100 MCG tablet Take 100 mcg by mouth daily before breakfast.    [provider]  traZODone (DESYREL) 100 MG tablet TAKE 1 TABLET BY MOUTH AT BEDTIME 09/20/19   Libby Maw, MD  venlafaxine [facility]) 75 MG tablet Take 75 mg by mouth daily.     [provider]  vitamin B-12 (CYANOCOBALAMIN) 100 MCG tablet 1 tablet    [provider]    Family History Family History  Problem Relation Age of Onset   Multiple myeloma Mother    Heart attack Father    Lung cancer Sister     Social History Social History   Tobacco Use   Smoking status: Never   Smokeless tobacco: Never  Vaping Use   Vaping Use: Never used  Substance Use Topics   Alcohol use: Not Currently   Drug use: Never     Allergies   Lidocaine   Review of Systems Review of Systems  As stated above in HPI Physical Exam Triage Vital Signs ED Triage Vitals  Enc Vitals Group     BP 07/25/20 2015 (!) 166/7     Pulse Rate 07/25/20 2015 82     Resp 07/25/20 2015 17     Temp 07/25/20 2015 98.4 F (36.9 C)     Temp src --      SpO2 07/25/20 2015 98 %     Weight --      Height --      Head Circumference --      Peak Flow --      Pain Score 07/25/20 2013 8     Pain Loc --      Pain Edu? --      Excl. in [place]? --    No data found.  Updated Vital Signs BP (!) 166/87 (BP Location: Right Arm)   Pulse 82   Temp 98.4 F (36.9 C)   Resp 17   SpO2 98%    Physical Exam Vitals and nursing note reviewed.  Constitutional:      General: She is not in acute distress.    Appearance: She  is well-developed. She is not ill-appearing, toxic-appearing or diaphoretic.     Comments: Patient sounds mildly congested.  HENT:     Head: Normocephalic and atraumatic.     Right Ear: A middle ear effusion is present. Tympanic membrane is not erythematous.  Left Ear: A middle ear effusion is present. Tympanic membrane is not erythematous.     Nose: Congestion and rhinorrhea (mild) present.     Mouth/Throat:     Pharynx: Oropharynx is clear. Uvula midline. No oropharyngeal exudate or posterior oropharyngeal erythema.     Tonsils: No tonsillar exudate or tonsillar abscesses.  Eyes:     Conjunctiva/sclera: Conjunctivae normal.     Pupils: Pupils are equal, round, and reactive to light.  Cardiovascular:     Rate and Rhythm: Normal rate and regular rhythm.     Heart sounds: Normal heart sounds.  Pulmonary:     Effort: Pulmonary effort is normal.     Breath sounds: Normal breath sounds.  Musculoskeletal:     Cervical back: Normal range of motion and neck supple.  Lymphadenopathy:     Cervical: No cervical adenopathy.  Skin:    General: Skin is warm.  Neurological:     Mental Status: She is alert and oriented to person, place, and time.     UC Treatments / Results  Labs (all labs ordered are listed, but only abnormal results are displayed) Labs Reviewed  SARS CORONAVIRUS 2 (TAT 6-24 HRS)    EKG   Radiology No results found.  Procedures Procedures (including critical care time)  Medications Ordered in UC Medications - No data to display  Initial Impression / Assessment and Plan / UC Course  I have reviewed the triage vital signs and the nursing notes.  Pertinent labs & imaging results that were available during my care of the patient were reviewed by me and considered in my medical decision making (see chart for details).     New.  This appears viral in nature.  COVID-19 testing pending.  For now we will treat with Tessalon, Flonase, rest and hydration with water.   Discussed red flag signs and symptoms. Final Clinical Impressions(s) / UC Diagnoses   Final diagnoses:  None   Discharge Instructions   None    ED Prescriptions   None    PDMP not reviewed this encounter.   Hughie Closs, Hershal Coria 07/25/20 2100

## 2020-07-25 NOTE — ED Triage Notes (Signed)
Pt is present today with sore throat, cough, nasal congestion, and HA. Pt states that her sx started three days ago.

## 2020-07-26 LAB — SARS CORONAVIRUS 2 (TAT 6-24 HRS): SARS Coronavirus 2: NEGATIVE

## 2020-08-20 DIAGNOSIS — R5383 Other fatigue: Secondary | ICD-10-CM | POA: Diagnosis not present

## 2020-08-20 DIAGNOSIS — R252 Cramp and spasm: Secondary | ICD-10-CM | POA: Diagnosis not present

## 2020-09-30 DIAGNOSIS — I10 Essential (primary) hypertension: Secondary | ICD-10-CM | POA: Diagnosis not present

## 2020-09-30 DIAGNOSIS — R5382 Chronic fatigue, unspecified: Secondary | ICD-10-CM | POA: Diagnosis not present

## 2020-09-30 DIAGNOSIS — K5909 Other constipation: Secondary | ICD-10-CM | POA: Diagnosis not present

## 2020-10-14 DIAGNOSIS — E1165 Type 2 diabetes mellitus with hyperglycemia: Secondary | ICD-10-CM | POA: Diagnosis not present

## 2020-10-14 DIAGNOSIS — R5383 Other fatigue: Secondary | ICD-10-CM | POA: Diagnosis not present

## 2020-10-14 DIAGNOSIS — I10 Essential (primary) hypertension: Secondary | ICD-10-CM | POA: Diagnosis not present

## 2020-10-31 DIAGNOSIS — Z23 Encounter for immunization: Secondary | ICD-10-CM | POA: Diagnosis not present

## 2020-11-11 ENCOUNTER — Other Ambulatory Visit: Payer: Self-pay | Admitting: Family Medicine

## 2020-11-11 DIAGNOSIS — Z1231 Encounter for screening mammogram for malignant neoplasm of breast: Secondary | ICD-10-CM

## 2020-11-20 DIAGNOSIS — N83201 Unspecified ovarian cyst, right side: Secondary | ICD-10-CM | POA: Diagnosis not present

## 2020-11-20 DIAGNOSIS — Z803 Family history of malignant neoplasm of breast: Secondary | ICD-10-CM | POA: Diagnosis not present

## 2020-11-24 ENCOUNTER — Telehealth: Payer: Self-pay | Admitting: Licensed Clinical Social Worker

## 2020-11-24 NOTE — Telephone Encounter (Signed)
Scheduled appt per 11/14 referral. Pt is aware of appt date and time.  

## 2020-12-08 ENCOUNTER — Inpatient Hospital Stay: Payer: Medicare HMO | Attending: Genetic Counselor | Admitting: Genetic Counselor

## 2020-12-08 ENCOUNTER — Other Ambulatory Visit: Payer: Self-pay

## 2020-12-08 ENCOUNTER — Inpatient Hospital Stay: Payer: Medicare HMO

## 2020-12-08 DIAGNOSIS — Z801 Family history of malignant neoplasm of trachea, bronchus and lung: Secondary | ICD-10-CM | POA: Diagnosis not present

## 2020-12-08 DIAGNOSIS — Z7183 Encounter for nonprocreative genetic counseling: Secondary | ICD-10-CM | POA: Diagnosis not present

## 2020-12-08 DIAGNOSIS — Z807 Family history of other malignant neoplasms of lymphoid, hematopoietic and related tissues: Secondary | ICD-10-CM | POA: Diagnosis not present

## 2020-12-08 DIAGNOSIS — Z800 Family history of malignant neoplasm of digestive organs: Secondary | ICD-10-CM | POA: Diagnosis not present

## 2020-12-09 ENCOUNTER — Encounter: Payer: Self-pay | Admitting: Hematology and Oncology

## 2020-12-09 ENCOUNTER — Encounter: Payer: Self-pay | Admitting: Genetic Counselor

## 2020-12-09 DIAGNOSIS — Z801 Family history of malignant neoplasm of trachea, bronchus and lung: Secondary | ICD-10-CM

## 2020-12-09 DIAGNOSIS — Z800 Family history of malignant neoplasm of digestive organs: Secondary | ICD-10-CM

## 2020-12-09 DIAGNOSIS — Z807 Family history of other malignant neoplasms of lymphoid, hematopoietic and related tissues: Secondary | ICD-10-CM | POA: Insufficient documentation

## 2020-12-09 HISTORY — DX: Family history of malignant neoplasm of trachea, bronchus and lung: Z80.1

## 2020-12-09 HISTORY — DX: Family history of other malignant neoplasms of lymphoid, hematopoietic and related tissues: Z80.7

## 2020-12-09 HISTORY — DX: Family history of malignant neoplasm of digestive organs: Z80.0

## 2020-12-09 NOTE — Progress Notes (Signed)
REFERRING PROVIDER: [name], DO [address] [place],  [place] [address]  PRIMARY PROVIDER:  Glenis Smoker, MD  PRIMARY REASON FOR VISIT:  1. Family history of lung cancer   2. Family history of colon cancer   3. Family history of multiple myeloma      HISTORY OF PRESENT ILLNESS:   Ms. Dishman, a 75 y.o. female, was seen for a [place] cancer genetics consultation at the request of Dr. Delora Fuel due to a family history of cancer.  Ms. Sackrider presents to clinic today to discuss the possibility of a hereditary predisposition to cancer, to discuss genetic testing, and to further clarify her future cancer risks, as well as potential cancer risks for family members.   Ms. Szalkowski is a 75 y.o. female with no personal history of cancer.  She has been monitored for an ovarian cyst for approximately 20 years.   CANCER HISTORY:  Oncology History   No history exists.    RISK FACTORS:  Menarche was at age 23 or 32.  First live birth at age 56.  OCP use for approximately 0 years.  Ovaries intact: yes.  Hysterectomy: yes.  Menopausal status: postmenopausal.  HRT use: approximately 2 months more than 20 years ago Colonoscopy: yes;  most recent 2 years ago . Mammogram within the last year: most recent 06/2019; scheduled for 12/2020 Number of breast biopsies: 1; decades ago; clogged duct her patient  Up to date with pelvic exams: yes. Any excessive radiation exposure in the past: no  Past Medical History:  Diagnosis Date   Diabetes mellitus type 2, diet-controlled ([place])    Family history of colon cancer 12/09/2020   Family history of lung cancer 12/09/2020   Family history of multiple myeloma 12/09/2020   HLD (hyperlipidemia)    Hypothyroidism    Iron deficiency anemia     Past Surgical History:  Procedure Laterality Date   arthroscopic knee     BIOPSY  03/08/2019   Procedure: BIOPSY;  Surgeon: Lavena Bullion, DO;  [facility];  Service:  Gastroenterology;;   C sections     x 7   COLONOSCOPY  2019   x2 At the [facility] and in [place]   ENTEROSCOPY N/A 03/08/2019   Procedure: ENTEROSCOPY;  Surgeon: Lavena Bullion, DO;  [facility];  Service: Gastroenterology;  Laterality: N/A;   HEMOSTASIS CLIP PLACEMENT  03/08/2019   Procedure: HEMOSTASIS CLIP PLACEMENT;  Surgeon: Lavena Bullion, DO;  [facility];  Service: Gastroenterology;;   SUBMUCOSAL TATTOO INJECTION  03/08/2019   Procedure: SUBMUCOSAL TATTOO INJECTION;  Surgeon: Lavena Bullion, DO;  [facility];  Service: Gastroenterology;;   TONSILLECTOMY     WISDOM TOOTH EXTRACTION     WRIST SURGERY      Social History   Socioeconomic History   Marital status: Widowed    Spouse name: Not on file   Number of children: 9   Years of education: Not on file   Highest education level: Not on file  Occupational History   Not on file  Tobacco Use   Smoking status: Never   Smokeless tobacco: Never  Vaping Use   Vaping Use: Never used  Substance and Sexual Activity   Alcohol use: Not Currently   Drug use: Never   Sexual activity: Not on file  Other Topics Concern   Not on file  Social History Narrative   Not on file   Social Determinants of Health  Financial Resource Strain: Not on file  Food Insecurity: Not on file  Transportation Needs: Not on file  Physical Activity: Not on file  Stress: Not on file  Social Connections: Not on file     FAMILY HISTORY:  We obtained a detailed, 4-generation family history.  Significant diagnoses are listed below: Family History  Problem Relation Age of Onset   Multiple myeloma Mother 72   Heart attack Father    Lung cancer Sister 21       smoking hx   Leukemia Paternal Aunt        dx before 72   Cancer Paternal Aunt        unknown type; dx after 72   Colon cancer Paternal Aunt        dx after 64   Head & neck cancer Paternal Uncle        dx before 59   Cancer Paternal  Uncle        unknown type; dx after 49   Cancer Paternal Uncle        color or gastric; dx after 49    Ms. Mculty had limited information about her maternal family history given her mother was adopted. Ms. Shimko is unaware of previous family history of genetic testing for hereditary cancer risks. Patient's maternal ancestors are of [place] descent, and paternal ancestors are of [place], [place], and [place] descent. There is a Jewish ancestry on her maternal and paternal side.   GENETIC COUNSELING ASSESSMENT: Ms. Recine is a 75 y.o. female with a family history of cancer which is not highly suggestive of a hereditary predisposition to cancer. We, therefore, discussed and recommended the following at today's visit.   DISCUSSION: We discussed that, in general, most cancer is not inherited in families, but instead is sporadic or familial. Sporadic cancers occur by chance and typically happen at older ages (>50 years) as this type of cancer is caused by genetic changes acquired during an individual's lifetime. Some families have more cancers than would be expected by chance; however, the ages or types of cancer are not consistent with a known genetic mutation or known genetic mutations have been ruled out. This type of familial cancer is thought to be due to a combination of multiple genetic, environmental, hormonal, and lifestyle factors. While this combination of factors likely increases the risk of cancer, the exact source of this risk is not currently identifiable or testable.    We discussed that approximately 5-10% of  cancer is hereditary, meaning that it is due to a mutation in a single gene that is passed down from generation to generation in a family.  We discussed that those of [place] descent have a higher chance of having certain mutations, such as in the BRCA1/2 genes, compared to those that do not have Ashkenazi Jewish ancestry. We discussed that testing can be beneficial for  several reasons, including knowing about other cancer risks, identifying potential screening and risk-reduction options that may be appropriate, and to understand if other family members could be at risk for cancer and allow them to undergo genetic testing.  We discussed with Ms. Bamberg that the family history does not meet insurance or NCCN criteria for genetic testing and, therefore, is not highly consistent with a familial hereditary cancer syndrome.  We feel she is at low risk to harbor a gene mutation associated with such a condition. Thus, we did not recommend any genetic testing, at this time, and recommended Ms. Mctavish continue  to follow the cancer screening guidelines given by her primary healthcare provider.  We discussed that given her Jewish ancestry, limited information about her maternal family, and unclear information about type of cancer diagnosis in several paternal relatives, it is reasonable for her to proceed with genetic testing.  Ms. Broda was not interested in genetic testing at this time.   PLAN:  Ms. Altmann did not wish to pursue genetic testing at today's visit. We understand this decision and remain available to coordinate genetic testing at any time in the future. We, therefore, recommend Ms. Roedel continue to follow the cancer screening guidelines given by her primary healthcare provider.  Lastly, we encouraged Ms. Grayson to remain in contact with cancer genetics annually so that we can continuously update the family history and inform her of any changes in cancer genetics and testing that may be of benefit for this family.   Ms. Beltran's questions were answered to her satisfaction today. Our contact information was provided should additional questions or concerns arise. Thank you for the referral and allowing [place] to share in the care of your patient.   Elsbeth Yearick M. Joette Catching, [place], [facility] Genetic Counselor [email or web address] (P) [phone]   The patient was seen  for a total of 35 minutes in face-to-face genetic counseling.   The patient was seen alone.  Drs. Magrinat, Lindi Adie and/or Burr Medico were available to discuss this case as needed.  _______________________________________________________________________ For Office Staff:  Number of people involved in session: 1 Was an Intern/ student involved with case: no

## 2020-12-16 ENCOUNTER — Ambulatory Visit
Admission: RE | Admit: 2020-12-16 | Discharge: 2020-12-16 | Disposition: A | Discharge status: Home / Self Care | Payer: Medicare HMO | Source: Ambulatory Visit | Attending: Family Medicine | Admitting: Family Medicine

## 2020-12-16 DIAGNOSIS — Z1231 Encounter for screening mammogram for malignant neoplasm of breast: Secondary | ICD-10-CM

## 2021-02-01 ENCOUNTER — Other Ambulatory Visit: Payer: Self-pay | Admitting: Hematology and Oncology

## 2021-02-01 DIAGNOSIS — D500 Iron deficiency anemia secondary to blood loss (chronic): Secondary | ICD-10-CM

## 2021-02-02 ENCOUNTER — Other Ambulatory Visit: Payer: Self-pay

## 2021-02-02 ENCOUNTER — Inpatient Hospital Stay: Payer: Medicare HMO

## 2021-02-02 ENCOUNTER — Inpatient Hospital Stay: Payer: Medicare HMO | Attending: Genetic Counselor | Admitting: Hematology and Oncology

## 2021-02-02 VITALS — BP 152/84 | HR 88 | Temp 97.8°F | Resp 17 | Wt 205.8 lb

## 2021-02-02 DIAGNOSIS — Z807 Family history of other malignant neoplasms of lymphoid, hematopoietic and related tissues: Secondary | ICD-10-CM | POA: Diagnosis not present

## 2021-02-02 DIAGNOSIS — Z800 Family history of malignant neoplasm of digestive organs: Secondary | ICD-10-CM | POA: Insufficient documentation

## 2021-02-02 DIAGNOSIS — D500 Iron deficiency anemia secondary to blood loss (chronic): Secondary | ICD-10-CM

## 2021-02-02 DIAGNOSIS — Z806 Family history of leukemia: Secondary | ICD-10-CM | POA: Diagnosis not present

## 2021-02-02 DIAGNOSIS — Z801 Family history of malignant neoplasm of trachea, bronchus and lung: Secondary | ICD-10-CM | POA: Diagnosis not present

## 2021-02-02 DIAGNOSIS — Z808 Family history of malignant neoplasm of other organs or systems: Secondary | ICD-10-CM | POA: Insufficient documentation

## 2021-02-02 DIAGNOSIS — E119 Type 2 diabetes mellitus without complications: Secondary | ICD-10-CM | POA: Insufficient documentation

## 2021-02-02 LAB — CMP (CANCER CENTER ONLY)
ALT: 17 U/L (ref 0–44)
AST: 14 U/L — ABNORMAL LOW (ref 15–41)
Albumin: 4.2 g/dL (ref 3.5–5.0)
Alkaline Phosphatase: 62 U/L (ref 38–126)
Anion gap: 8 (ref 5–15)
BUN: 20 mg/dL (ref 8–23)
CO2: 28 mmol/L (ref 22–32)
Calcium: 9.7 mg/dL (ref 8.9–10.3)
Chloride: 104 mmol/L (ref 98–111)
Creatinine: 0.82 mg/dL (ref 0.44–1.00)
GFR, Estimated: >60 mL/min (ref >60)
Glucose, Bld: 169 mg/dL — ABNORMAL HIGH (ref 70–99)
Potassium: 3.8 mmol/L (ref 3.5–5.1)
Sodium: 140 mmol/L (ref 135–145)
Total Bilirubin: 0.3 mg/dL (ref 0.3–1.2)
Total Protein: 7 g/dL (ref 6.5–8.1)

## 2021-02-02 LAB — RETIC PANEL
Immature Retic Fract: 13.1 % (ref 2.3–15.9)
RBC.: 4.04 MIL/uL (ref 3.87–5.11)
Retic Count, Absolute: 61 10*3/uL (ref 19.0–186.0)
Retic Ct Pct: 1.5 % (ref 0.4–3.1)
Reticulocyte Hemoglobin: 34.5 pg (ref >27.9)

## 2021-02-02 LAB — CBC WITH DIFFERENTIAL (CANCER CENTER ONLY)
Abs Immature Granulocytes: 0.03 10*3/uL (ref 0.00–0.07)
Basophils Absolute: 0.1 10*3/uL (ref 0.0–0.1)
Basophils Relative: 1 %
Eosinophils Absolute: 0.3 10*3/uL (ref 0.0–0.5)
Eosinophils Relative: 4 %
HCT: 37.9 % (ref 36.0–46.0)
Hemoglobin: 12 g/dL (ref 12.0–15.0)
Immature Granulocytes: 0 %
Lymphocytes Relative: 30 %
Lymphs Abs: 2.1 10*3/uL (ref 0.7–4.0)
MCH: 28.9 pg (ref 26.0–34.0)
MCHC: 31.7 g/dL (ref 30.0–36.0)
MCV: 91.3 fL (ref 80.0–100.0)
Monocytes Absolute: 0.5 10*3/uL (ref 0.1–1.0)
Monocytes Relative: 7 %
Neutro Abs: 4.1 10*3/uL (ref 1.7–7.7)
Neutrophils Relative %: 58 %
Platelet Count: 202 10*3/uL (ref 150–400)
RBC: 4.15 MIL/uL (ref 3.87–5.11)
RDW: 13.3 % (ref 11.5–15.5)
WBC Count: 7.1 10*3/uL (ref 4.0–10.5)
nRBC: 0 % (ref 0.0–0.2)

## 2021-02-02 LAB — IRON AND IRON BINDING CAPACITY (CC-WL,HP ONLY)
Iron: 96 ug/dL (ref 28–170)
Saturation Ratios: 28 % (ref 10.4–31.8)
TIBC: 340 ug/dL (ref 250–450)
UIBC: 244 ug/dL (ref 148–442)

## 2021-02-02 LAB — FERRITIN: Ferritin: 286 ng/mL (ref 11–307)

## 2021-02-02 LAB — MAGNESIUM: Magnesium: 1.3 mg/dL — ABNORMAL LOW (ref 1.7–2.4)

## 2021-02-02 NOTE — Progress Notes (Signed)
[place] [phone]   [phone]  PROGRESS NOTE  Patient Care Team: Glenis Smoker, MD as PCP - General (Family Medicine)  Hematological/Oncological History #Iron Deficiency Anemia 2/2 to GI Bleeding 1) 2019: presented to [facility] in [facility] with symptoms of anemia. Found to have Hgb 5.0. EGD/colon/capsule reported found no source of bleed 2) 01/18/2019: establish care with Dr. Lorenso Courier  3) 03/08/2019: admitted for acute GI bleed. Endoscopy revealed an actively bleeding Dieulafoy lesion that was clipped. Received IV feraheme 580m x 1 dose during admission 4) 03/15/2019: received dose 2 of IV feraheme 5138min outpatient setting. 5) 04/13/2019: WBC 8.6, Hgb 12.2, MCV 92.1, Plt 216 6) 07/19/2019: WBC 7.1, Hgb 12.2, MCV 90.6, Plt 226  Interval History:  Dana Crandell89.o. female with medical history significant for Dieulafoy lesion bleeding with iron deficiency anemia who presents for a follow up visit. The patient's last visit was on 01/18/2020. In the interim since the last visit she has had no further hospitalizations/ED visits or concern for bleeding.  On exam today Mrs. Nine notes he has been having difficulty with low levels of energy.  When she was told by her primary doctor that her hemoglobin levels were normal she had her magnesium checked which was modestly low.  She has been taking magnesium pills in order to help supplement this.  She reports that she has been feeling "fatigued and drained".  She notes that she has not had any overt signs of bleeding or bruising in the interim since her last visit.  She is also been having cravings for potatoes.  Her blood pressure is modestly elevated today though she notes she has not taken her blood pressure medications this morning.  She denies any fevers, chills, sweats, nausea, vomiting or diarrhea.  A full 10 point ROS is listed below.  MEDICAL HISTORY:  Past Medical History:  Diagnosis Date    Diabetes mellitus type 2, diet-controlled (HCLeon   Family history of colon cancer 12/09/2020   Family history of lung cancer 12/09/2020   Family history of multiple myeloma 12/09/2020   HLD (hyperlipidemia)    Hypothyroidism    Iron deficiency anemia     SURGICAL HISTORY: Past Surgical History:  Procedure Laterality Date   arthroscopic knee     BIOPSY  03/08/2019   Procedure: BIOPSY;  Surgeon: CiLavena BullionDO;  Location: [facility];  Service: Gastroenterology;;   C sections     x 7   [place] [facility] [place]   ENTEROSCOPY N/A 03/08/2019   Procedure: ENTEROSCOPY;  Surgeon: CiLavena BullionDO;  Location: [facility];  Service: Gastroenterology;  Laterality: N/A;   HEMOSTASIS CLIP PLACEMENT  03/08/2019   Procedure: HEMOSTASIS CLIP PLACEMENT;  Surgeon: CiLavena BullionDO;  Location: [facility];  Service: Gastroenterology;;   SUBMUCOSAL TATTOO INJECTION  03/08/2019   Procedure: SUBMUCOSAL TATTOO INJECTION;  Surgeon: CiLavena BullionDO;  Location: [facility];  Service: Gastroenterology;;   TONSILLECTOMY     WISDOM TOOTH EXTRACTION     WRIST SURGERY      SOCIAL HISTORY: Social History   Socioeconomic History   Marital status: Widowed    Spouse name: Not on file   Number of children: 9   Years of education: Not on file   Highest education level: Not on file  Occupational History   Not on file  Tobacco Use   Smoking status: Never  Smokeless tobacco: Never  Vaping Use   Vaping Use: Never used  Substance and Sexual Activity   Alcohol use: Not Currently   Drug use: Never   Sexual activity: Not on file  Other Topics Concern   Not on file  Social History Narrative   Not on file   Social Determinants of Health   Financial Resource Strain: Not on file  Food Insecurity: Not on file  Transportation Needs: Not on file  Physical Activity: Not on file  Stress: Not on file  Social Connections: Not on file  Intimate  Partner Violence: Not on file    FAMILY HISTORY: Family History  Problem Relation Age of Onset   Multiple myeloma Mother 88   Heart attack Father    Lung cancer Sister 27       smoking hx   Leukemia Paternal Aunt        dx before 56   Cancer Paternal Aunt        unknown type; dx after 49   Colon cancer Paternal Aunt        dx after 71   Head & neck cancer Paternal Uncle        dx before 7   Cancer Paternal Uncle        unknown type; dx after 56   Cancer Paternal Uncle        color or gastric; dx after 8    ALLERGIES:  is allergic to lidocaine.  MEDICATIONS:  Current Outpatient Medications  Medication Sig Dispense Refill   ACCU-CHEK AVIVA PLUS test strip      benzonatate (TESSALON) 100 MG capsule Take 1 capsule (100 mg total) by mouth every 8 (eight) hours. 21 capsule 0   EQ ALLERGY RELIEF, CETIRIZINE, 10 MG tablet Take 1 tablet by mouth once daily 90 tablet 0   fluticasone (FLONASE) 50 MCG/ACT nasal spray Place 2 sprays into both nostrils daily. 16 mL 0   furosemide (LASIX) 20 MG tablet Take 20 mg by mouth daily.     gabapentin (NEURONTIN) 100 MG capsule 1-3 capsules nightly as needed     losartan (COZAAR) 25 MG tablet Take 25 mg by mouth daily.     metFORMIN (GLUCOPHAGE) 1000 MG tablet Take 1,000 mg by mouth 2 (two) times daily with a meal.     pantoprazole (PROTONIX) 40 MG tablet Take 1 tablet (40 mg total) by mouth 2 (two) times daily. 112 tablet 0   simvastatin (ZOCOR) 40 MG tablet Take 1 tablet (40 mg total) by mouth daily at 6 PM. 90 tablet 3   sucralfate (CARAFATE) 1 g tablet Take 1 tablet (1 g total) by mouth 4 (four) times daily. Dissolved in 15ML water 120 tablet 0   SYNTHROID 100 MCG tablet Take 100 mcg by mouth daily before breakfast.     traZODone (DESYREL) 100 MG tablet TAKE 1 TABLET BY MOUTH AT BEDTIME 90 tablet 0   venlafaxine (EFFEXOR) 75 MG tablet Take 75 mg by mouth daily.      vitamin B-12 (CYANOCOBALAMIN) 100 MCG tablet 1 tablet     No current  facility-administered medications for this visit.    REVIEW OF SYSTEMS:   Constitutional: ( - ) fevers, ( - )  chills , ( - ) night sweats Eyes: ( - ) blurriness of vision, ( - ) double vision, ( - ) watery eyes Ears, nose, mouth, throat, and face: ( - ) mucositis, ( - ) sore throat Respiratory: ( - ) cough, ( - )  dyspnea, ( - ) wheezes Cardiovascular: ( - ) palpitation, ( - ) chest discomfort, ( - ) lower extremity swelling Gastrointestinal:  ( - ) nausea, ( - ) heartburn, ( - ) change in bowel habits Skin: ( - ) abnormal skin rashes Lymphatics: ( - ) new lymphadenopathy, ( - ) easy bruising Neurological: ( - ) numbness, ( - ) tingling, ( - ) new weaknesses Behavioral/Psych: ( - ) mood change, ( - ) new changes  All other systems were reviewed with the patient and are negative.  PHYSICAL EXAMINATION: ECOG PERFORMANCE STATUS: 1 - Symptomatic but completely ambulatory  Vitals:   02/02/21 1451  BP: (!) 152/84  Pulse: 88  Resp: 17  Temp: 97.8 F (36.6 C)  SpO2: 99%   Filed Weights   02/02/21 1451  Weight: 205 lb 12.8 oz (93.4 kg)    GENERAL:well appearing elderly Caucasian female alert, no distress and comfortable SKIN: skin color, texture, turgor are normal, no rashes or significant lesions EYES: conjunctiva are pink and non-injected, sclera clear LUNGS: clear to auscultation and percussion with normal breathing effort HEART: regular rate & rhythm and no murmurs and no lower extremity edema Musculoskeletal: no cyanosis of digits and no clubbing  PSYCH: alert & oriented x 3, fluent speech NEURO: no focal motor/sensory deficits  LABORATORY DATA:  I have reviewed the data as listed CBC Latest Ref Rng & Units 02/02/2021 01/18/2020 10/19/2019  WBC 4.0 - 10.5 K/uL 7.1 7.0 6.9  Hemoglobin 12.0 - 15.0 g/dL 12.0 12.7 12.3  Hematocrit 36.0 - 46.0 % 37.9 39.0 38.5  Platelets 150 - 400 K/uL 202 227 217    CMP Latest Ref Rng & Units 02/02/2021 01/18/2020 07/19/2019  Glucose 70 - 99 mg/dL  169(H) 108(H) 105(H)  BUN 8 - 23 mg/dL _0 Creatinine 0.44 - 1.00 mg/dL 0.82 0.81 0.81  Sodium 135 - 145 mmol/L 140 141 146(H)  Potassium 3.5 - 5.1 mmol/L 3.8 4.1 4.0  Chloride 98 - 111 mmol/L 104 106 109  CO2 22 - 32 mmol/L _1 Calcium 8.9 - 10.3 mg/dL 9.7 10.0 9.7  Total Protein 6.5 - 8.1 g/dL 7.0 7.6 7.3  Total Bilirubin 0.3 - 1.2 mg/dL 0.3 0.5 0.3  Alkaline Phos 38 - 126 U/L 62 60 61  AST 15 - 41 U/L 14(L) 19 17  ALT 0 - 44 U/L _2 RADIOGRAPHIC STUDIES: No results found.  ASSESSMENT & PLAN Maritta Kief 76 y.o. female with medical history significant for Dieulafoy lesion bleeding with iron deficiency anemia who presents for a follow up visit. After review the labs, discussion with the patient, and physical examination it appears that the patient has stable hemoglobin over the last several months. She has no evidence of GI bleeding and her iron stores appear replete at this time. Given these findings I think would be safe to follow-up with her in approximately 6 months time. As always we are happy to see the patient back sooner than those visits if she is feeling more symptomatic or has concerns for GI bleeding. Overall she is stable at this time.  #Iron Deficiency Anemia 2/2 to GI Bleeding --today will recheck iron panel, ferritin, reticulocyte panel, and CBC --patient is s/p IV iron in in March 2021 --no evidence of bleeding on today's exam. Hgb is currently stably within the normal range --Hgb 12.0, WBC 7.1, MCV 91.3, Plt 202. Iron studies pending.  --plan for RTC in 6  months time. We can always see her sooner if she were to develop new symptoms or concern for dropping Hgb.   #Jehovah's Witness --the patient is not to receive blood products under any circumstances, per her request. We have copied and uploaded her Blood Contract which she carries into her wallet into our system. --in the event the patient is admitted with severe anemia or bleed please  make the Hematology service aware so that we may provide bloodless options for support.    Orders Placed This Encounter  Procedures   Magnesium    Standing Status:   Future    Number of Occurrences:   1    Standing Expiration Date:   02/02/2022   All questions were answered. The patient knows to call the clinic with any problems, questions or concerns.  A total of more than 30 minutes were spent on this encounter and over half of that time was spent on counseling and coordination of care as outlined above.   Ledell Peoples, MD Department of Hematology/Oncology [place] at [facility] Phone: [phone] Pager: [phone] Email: [email or web address]  02/02/2021 4:04 PM

## 2021-02-03 ENCOUNTER — Telehealth: Payer: Self-pay | Admitting: Hematology and Oncology

## 2021-02-03 NOTE — Telephone Encounter (Signed)
Scheudled per 1/23 los, message has been left with pt

## 2021-02-04 ENCOUNTER — Telehealth: Payer: Self-pay

## 2021-02-04 NOTE — Telephone Encounter (Signed)
Left VM requesting call back to review lab results from 02/02/21.

## 2021-02-04 NOTE — Telephone Encounter (Signed)
Left VM with lab results per Dr. Lorenso Courier. Informed patient that her Magnesium level is low and requested that she call back if she would like Dr. Lorenso Courier to prescribed MG 400 mg tab daily. She takes magnesium at home per his note. Also encouraged her to reach out to her PCP to f/u with magnesium level. Encouraged pt to call back with questions or further explanation or if she would like Magnesium  prescription sent to her pharmacy.

## 2021-02-11 ENCOUNTER — Ambulatory Visit: Payer: Medicare HMO | Admitting: Gastroenterology

## 2021-02-11 ENCOUNTER — Other Ambulatory Visit: Payer: Self-pay | Admitting: *Deleted

## 2021-02-11 MED ORDER — MAGNESIUM OXIDE -MG SUPPLEMENT 400 (240 MG) MG PO TABS: 400.0000 mg | ORAL_TABLET | Freq: Every day | ORAL | 1 refills | Status: DC

## 2021-02-11 NOTE — Telephone Encounter (Signed)
Pt left a VM stating she does want Dr Lorenso Courier to send in a prescription for Magnesium. And she would like Dr Lorenso Courier to follow her labs rather than PCP. Prescription sent for Magnesium 400 mg daily.

## 2021-02-24 DIAGNOSIS — R69 Illness, unspecified: Secondary | ICD-10-CM | POA: Diagnosis not present

## 2021-03-27 DIAGNOSIS — N390 Urinary tract infection, site not specified: Secondary | ICD-10-CM | POA: Diagnosis not present

## 2021-04-02 ENCOUNTER — Encounter (HOSPITAL_COMMUNITY): Payer: Self-pay | Admitting: *Deleted

## 2021-04-02 ENCOUNTER — Ambulatory Visit (HOSPITAL_COMMUNITY)
Admission: EM | Admit: 2021-04-02 | Discharge: 2021-04-02 | Disposition: A | Discharge status: Home / Self Care | Payer: Medicare HMO | Attending: Nurse Practitioner | Admitting: Nurse Practitioner

## 2021-04-02 ENCOUNTER — Other Ambulatory Visit: Payer: Self-pay

## 2021-04-02 DIAGNOSIS — N3000 Acute cystitis without hematuria: Secondary | ICD-10-CM | POA: Diagnosis not present

## 2021-04-02 LAB — POCT URINALYSIS DIPSTICK, ED / UC
Bilirubin Urine: NEGATIVE
Glucose, UA: NEGATIVE mg/dL
Ketones, ur: NEGATIVE mg/dL
Nitrite: NEGATIVE
Protein, ur: 100 mg/dL — AB
Specific Gravity, Urine: 1.025 (ref 1.005–1.030)
Urobilinogen, UA: 0.2 mg/dL (ref 0.0–1.0)
pH: 5.5 (ref 5.0–8.0)

## 2021-04-02 MED ORDER — NITROFURANTOIN MONOHYD MACRO 100 MG PO CAPS: 100.0000 mg | ORAL_CAPSULE | Freq: Two times a day (BID) | ORAL | 0 refills | Status: AC

## 2021-04-02 NOTE — Discharge Instructions (Addendum)
Your urinalysis suggest that you do have a urinary tract infection.  We will send a urine culture.  If that result is negative, we will contact you to stop the medication. ?Take medication as prescribed. ?It is recommended that you increase your water intake, void at least every 2 hours, wear cotton underwear. ?Follow-up if you develop fever, chills, abdominal pain, low back pain, or if your symptoms are not improving. ?

## 2021-04-02 NOTE — ED Triage Notes (Signed)
Pt reports dysuria,urgency ,fequency. For one week. ?

## 2021-04-02 NOTE — ED Provider Notes (Signed)
[facility]    CSN: [id number] Arrival date & time: 04/02/21  1454      History   Chief Complaint Chief Complaint  Patient presents with   UTI    HPI Dana Nielsen is a 76 y.o. female.   Patient is a 76 year old female who presents for urinary symptoms.  Patient states her symptoms started over a week ago.  States that she went to [place] urgent care, provided a sample, and she was diagnosed with a urinary tract infection.  They also ordered a urine culture.  When the urine culture results were received by the clinic, she was contacted and told that she did not have a urinary tract infection and she states the prescription was canceled, stating that she never had a chance to pick it up to even start the medication.  She complains of urinary frequency, urgency, dysuria, and abdominal bloating.  She denies fever, chills, abdominal pain, suprapubic pain, hematuria, or low back pain.  Patient states she did have a history of recurrent [facility]  She states that she is no longer doing that.  States her last UTI was approximately 8 to 9 months ago.    Past Medical History:  Diagnosis Date   Diabetes mellitus type 2, diet-controlled (HCC)    Family history of colon cancer 12/09/2020   Family history of lung cancer 12/09/2020   Family history of multiple myeloma 12/09/2020   HLD (hyperlipidemia)    Hypothyroidism    Iron deficiency anemia     Patient Active Problem List   Diagnosis Date Noted   Family history of lung cancer 12/09/2020   Family history of colon cancer 12/09/2020   Family history of multiple myeloma 12/09/2020   Seasonal allergic rhinitis due to pollen 04/06/2019   Arterial hypotension 03/20/2019   Labyrinthitis 03/20/2019   Iron deficiency anemia due to chronic blood loss 03/09/2019   Dieulafoy lesion of duodenum    Gastric ulcer without hemorrhage or perforation    Gastritis and gastroduodenitis     Hiatal hernia    GI bleeding 03/07/2019   Blood loss anemia 03/07/2019   Melena 03/05/2019   Primary insomnia 12/14/2018   Patient is Jehovah's Witness 12/14/2018   Iron deficiency anemia 12/14/2018   Hypothyroidism 12/14/2018   Depression, major, single episode, mild (HCC) 12/14/2018   Controlled type 2 diabetes mellitus with complication, without long-term current use of insulin (HCC) 12/14/2018   Essential hypertension 12/14/2018   Vitamin D deficiency 12/14/2018   Elevated cholesterol 12/14/2018    Past Surgical History:  Procedure Laterality Date   arthroscopic knee     BIOPSY  03/08/2019   Procedure: BIOPSY;  Surgeon: Shellia Cleverly, DO;  Location: [facility];  Service: Gastroenterology;;   C sections     x 7   [place]   ENTEROSCOPY N/A 03/08/2019   Procedure: ENTEROSCOPY;  Surgeon: Shellia Cleverly, DO;  Location: [facility];  Service: Gastroenterology;  Laterality: N/A;   HEMOSTASIS CLIP PLACEMENT  03/08/2019   Procedure: HEMOSTASIS CLIP PLACEMENT;  Surgeon: Shellia Cleverly, DO;  [facility]: [facility];  Service: Gastroenterology;;   SUBMUCOSAL TATTOO INJECTION  03/08/2019   Procedure: SUBMUCOSAL TATTOO INJECTION;  Surgeon: Shellia Cleverly, DO;  [facility];  Service: Gastroenterology;;   TONSILLECTOMY     WISDOM TOOTH EXTRACTION     WRIST SURGERY  OB History   No obstetric history on file.      Home Medications    Prior to Admission medications   Medication Sig Start Date End Date Taking? Authorizing Provider  nitrofurantoin, macrocrystal-monohydrate, (MACROBID) 100 MG capsule Take 1 capsule (100 mg total) by mouth 2 (two) times daily for 5 days. 04/02/21 04/07/21 Yes Leath-Warren, Sadie Haber, NP  ACCU-CHEK AVIVA PLUS test strip  03/09/19   [provider]  benzonatate (TESSALON) 100 MG capsule Take 1 capsule (100 mg total) by mouth every 8 (eight) hours. 07/25/20    Rushie Chestnut, PA-C  EQ ALLERGY RELIEF, CETIRIZINE, 10 MG tablet Take 1 tablet by mouth once daily 12/12/19   Worthy Rancher B, FNP  fluticasone [facility]) 50 MCG/ACT nasal spray Place 2 sprays into both nostrils daily. 07/25/20   Rushie Chestnut, PA-C  furosemide (LASIX) 20 MG tablet Take 20 mg by mouth daily.    [provider]  gabapentin (NEURONTIN) 100 MG capsule 1-3 capsules nightly as needed 10/12/19   [provider]  losartan (COZAAR) 25 MG tablet Take 25 mg by mouth daily.    [provider]  magnesium oxide (MAG-OX) 400 (240 Mg) MG tablet Take 1 tablet (400 mg total) by mouth daily. 02/11/21   Jaci Standard, MD  metFORMIN (GLUCOPHAGE) 1000 MG tablet Take 1,000 mg by mouth 2 (two) times daily with a meal.    [provider]  pantoprazole (PROTONIX) 40 MG tablet Take 1 tablet (40 mg total) by mouth 2 (two) times daily. 03/09/19 05/04/19  [place], Alvira Philips, DO  simvastatin (ZOCOR) 40 MG tablet Take 1 tablet (40 mg total) by mouth daily at 6 PM. 04/06/19   Mliss Sax, MD  sucralfate (CARAFATE) 1 g tablet Take 1 tablet (1 g total) by mouth 4 (four) times daily. Dissolved in water 03/09/19 04/08/19  [place], Eric J, DO  SYNTHROID 100 MCG tablet Take 100 mcg by mouth daily before breakfast.    [provider]  traZODone (DESYREL) 100 MG tablet TAKE 1 TABLET BY MOUTH AT BEDTIME 09/20/19   Mliss Sax, MD  venlafaxine [facility]) 75 MG tablet Take 75 mg by mouth daily.     [provider]  vitamin B-12 (CYANOCOBALAMIN) 100 MCG tablet 1 tablet    [provider]    Family History Family History  Problem Relation Age of Onset   Multiple myeloma Mother 61   Heart attack Father    Lung cancer Sister 50       smoking hx   Leukemia Paternal Aunt        dx before 66   Cancer Paternal Aunt        unknown type; dx after 2   Colon cancer Paternal Aunt        dx after 50   Head & neck cancer Paternal Uncle         dx before 108   Cancer Paternal Uncle        unknown type; dx after 50   Cancer Paternal Uncle        color or gastric; dx after 33    Social History Social History   Tobacco Use   Smoking status: Never   Smokeless tobacco: Never  Vaping Use   Vaping Use: Never used  Substance Use Topics   Alcohol use: Not Currently   Drug use: Never     Allergies   Lidocaine   Review of Systems Review  of Systems  Constitutional: Negative.   Respiratory: Negative.    Cardiovascular: Negative.   Gastrointestinal:  Positive for abdominal distention.  Genitourinary:  Positive for dysuria, frequency and urgency. Negative for decreased urine volume, flank pain, hematuria, vaginal bleeding, vaginal discharge and vaginal pain.  Musculoskeletal:  Negative for back pain.  Skin: Negative.   Psychiatric/Behavioral: Negative.      Physical Exam Triage Vital Signs ED Triage Vitals  Enc Vitals Group     BP 04/02/21 1535 106/72     Pulse Rate 04/02/21 1535 96     Resp 04/02/21 1535 18     Temp 04/02/21 1535 98.8 F (37.1 C)     Temp src --      SpO2 04/02/21 1535 97 %     Weight --      Height --      Head Circumference --      Peak Flow --      Pain Score 04/02/21 1533 0     Pain Loc --      Pain Edu? --      Excl. in GC? --    No data found.  Updated Vital Signs BP 106/72   Pulse 96   Temp 98.8 F (37.1 C)   Resp 18   SpO2 97%   Visual Acuity Right Eye Distance:   Left Eye Distance:   Bilateral Distance:    Right Eye Near:   Left Eye Near:    Bilateral Near:     Physical Exam Vitals reviewed.  Constitutional:      Appearance: Normal appearance.  HENT:     Head: Normocephalic and atraumatic.  Eyes:     Extraocular Movements: Extraocular movements intact.     Conjunctiva/sclera: Conjunctivae normal.     Pupils: Pupils are equal, round, and reactive to light.  Cardiovascular:     Rate and Rhythm: Normal rate and regular rhythm.     Pulses: Normal pulses.      Heart sounds: Normal heart sounds.  Pulmonary:     Effort: Pulmonary effort is normal.     Breath sounds: Normal breath sounds.  Abdominal:     General: Bowel sounds are normal.     Palpations: Abdomen is soft.     Tenderness: There is no abdominal tenderness. There is no right CVA tenderness or left CVA tenderness.  Musculoskeletal:     Cervical back: Normal range of motion and neck supple.  Skin:    General: Skin is warm and dry.  Neurological:     Mental Status: She is alert and oriented to person, place, and time.  Psychiatric:        Mood and Affect: Mood normal.        Behavior: Behavior normal.     UC Treatments / Results  Labs (all labs ordered are listed, but only abnormal results are displayed) Labs Reviewed  POCT URINALYSIS DIPSTICK, ED / UC - Abnormal; Notable for the following components:      Result Value   Hgb urine dipstick LARGE (*)    Protein, ur 100 (*)    Leukocytes,Ua MODERATE (*)    All other components within normal limits  URINE CULTURE    EKG   Radiology No results found.  Procedures Procedures (including critical care time)  Medications Ordered in UC Medications - No data to display  Initial Impression / Assessment and Plan / UC Course  I have reviewed the triage vital signs and the nursing notes.  Pertinent labs & imaging results that were available during my care of the patient were reviewed by me and considered in my medical decision making (see chart for details).  The patient presents with 1 week of urinary symptoms.  She states over the past 24 hours, her symptoms worsen.  She was seen approximately 1 week ago at a [facility] facility, and she was eventually told that she did not have a urinary tract infection.  She states that she did not start any medications that were prescribed as they were canceled by the clinic.  She continues to have dysuria, urinary frequency, urinary urgency.  She does not display any symptoms of pyelonephritis,  or other systemic infection.  Vital signs are stable, and the patient is in no acute distress, I will start the patient on Macrobid, and order another urine culture.  Patient is advised that if her urine culture is positive, we will contact her to stop the medication.  Also recommended supportive care to include increasing fluids, voiding at least every 2 hours, and wearing cotton underwear.  Patient encouraged to follow-up if she develops fever, chills, abdominal pain, low back pain or worsening symptoms. Final Clinical Impressions(s) / UC Diagnoses   Final diagnoses:  Acute cystitis without hematuria     Discharge Instructions      Your urinalysis suggest that you do have a urinary tract infection.  We will send a urine culture.  If that result is negative, we will contact you to stop the medication. Take medication as prescribed. It is recommended that you increase your water intake, void at least every 2 hours, wear cotton underwear. Follow-up if you develop fever, chills, abdominal pain, low back pain, or if your symptoms are not improving.     ED Prescriptions     Medication Sig Dispense Auth. Provider   nitrofurantoin, macrocrystal-monohydrate, (MACROBID) 100 MG capsule Take 1 capsule (100 mg total) by mouth 2 (two) times daily for 5 days. 10 capsule Leath-Warren, Sadie Haber, NP      PDMP not reviewed this encounter.   Abran Cantor, NP 04/02/21 [phone]

## 2021-04-04 LAB — URINE CULTURE

## 2021-04-05 DIAGNOSIS — N3000 Acute cystitis without hematuria: Secondary | ICD-10-CM | POA: Diagnosis not present

## 2021-04-06 ENCOUNTER — Encounter: Payer: Self-pay | Admitting: Emergency Medicine

## 2021-04-30 IMAGING — US US ABDOMEN COMPLETE
1 series · 14 of 25 positions shown · non-contrast
Comparison: None.

CLINICAL DATA: Hepatomegaly

EXAM:
ABDOMEN ULTRASOUND COMPLETE

[Series 1: us abdomen complete · 14 of 77 slices shown]
[im 1/77]
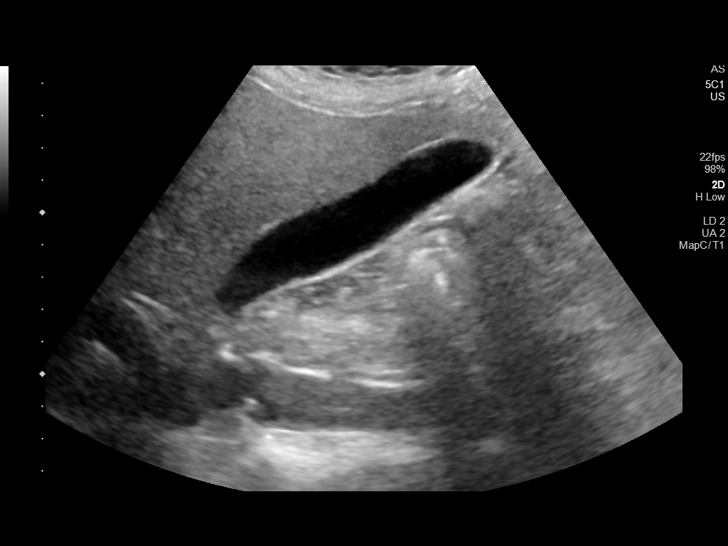
[im 7/77]
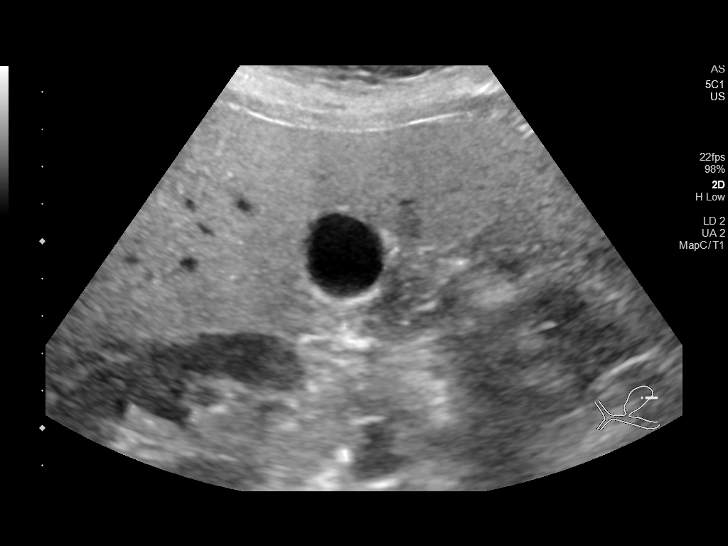
[im 13/77]
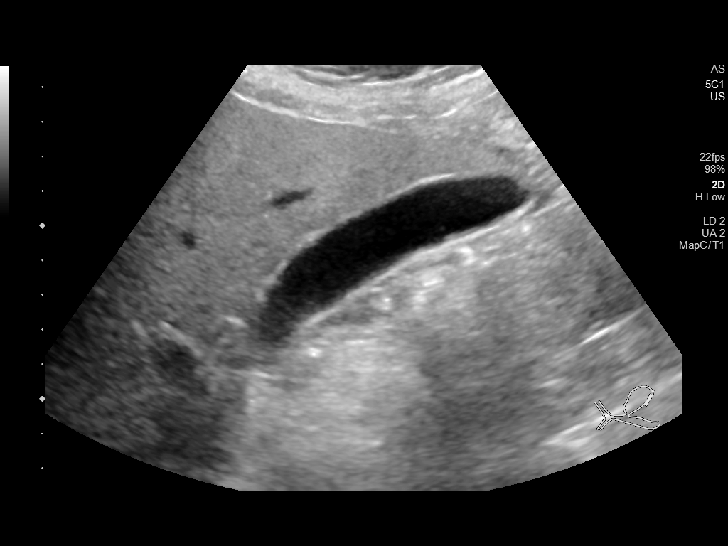
[im 20/77]
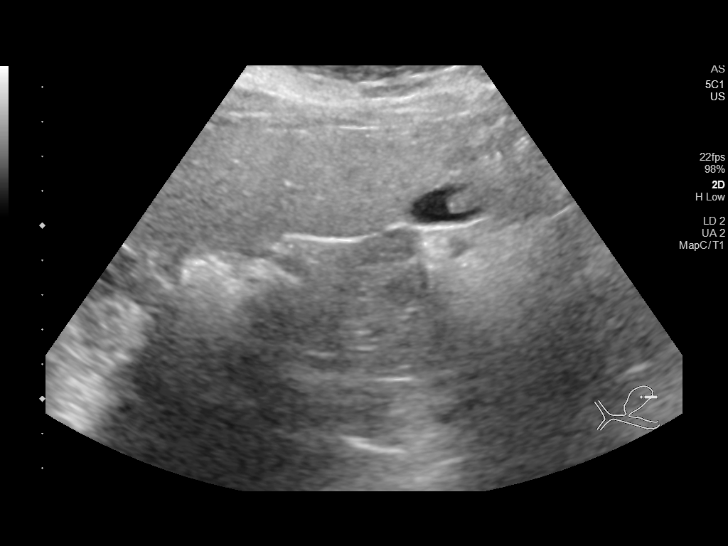
[im 26/77]
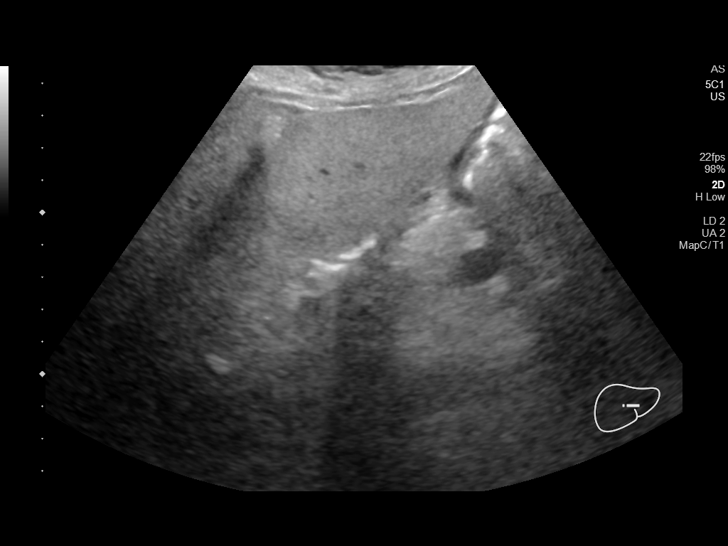
[im 29/77]
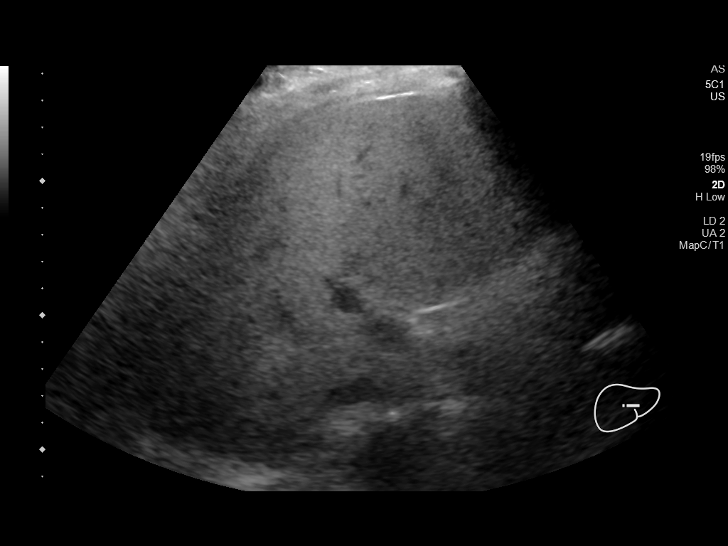
[im 35/77]
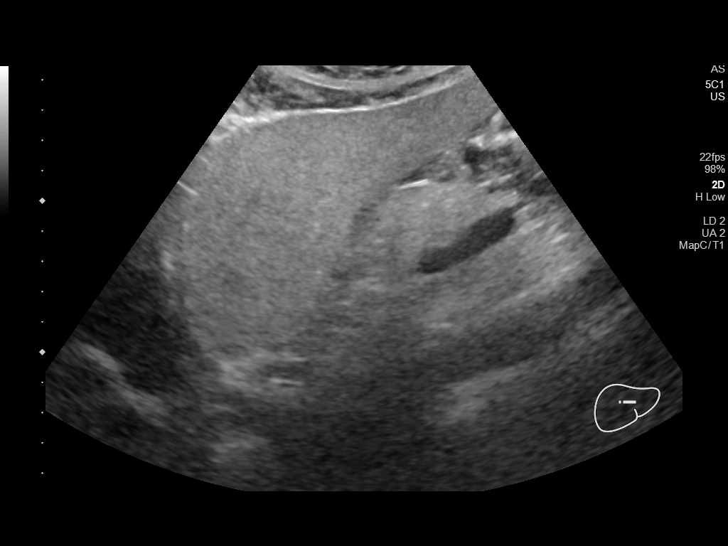
[im 42/77]
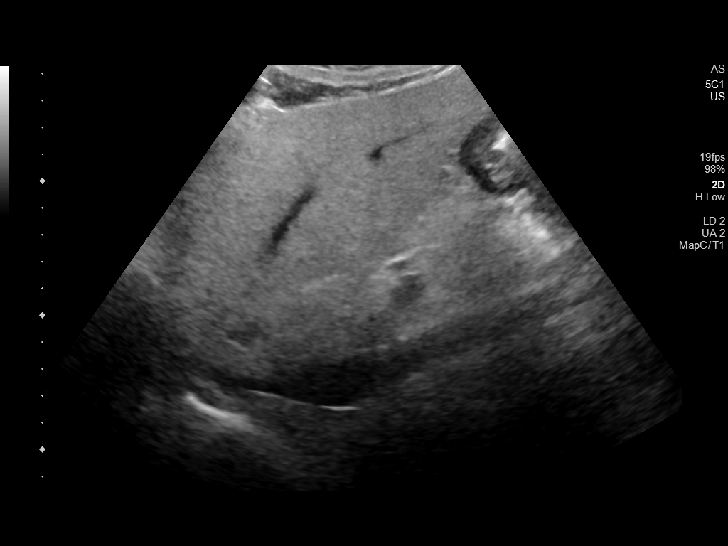
[im 48/77]
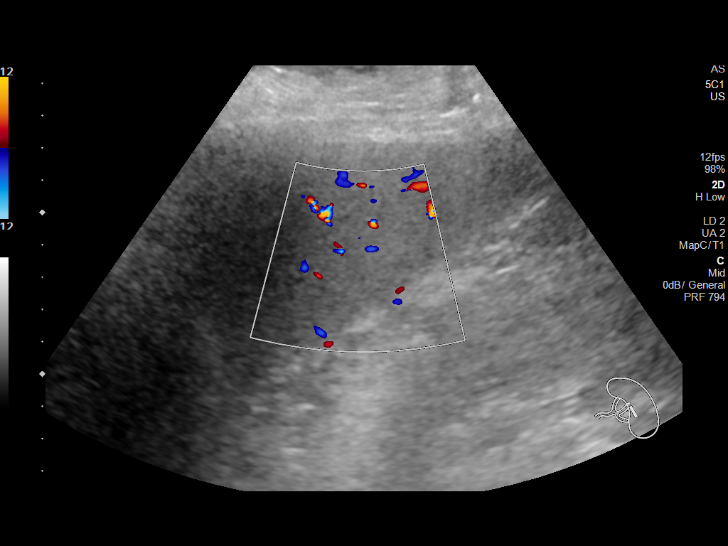
[im 51/77]
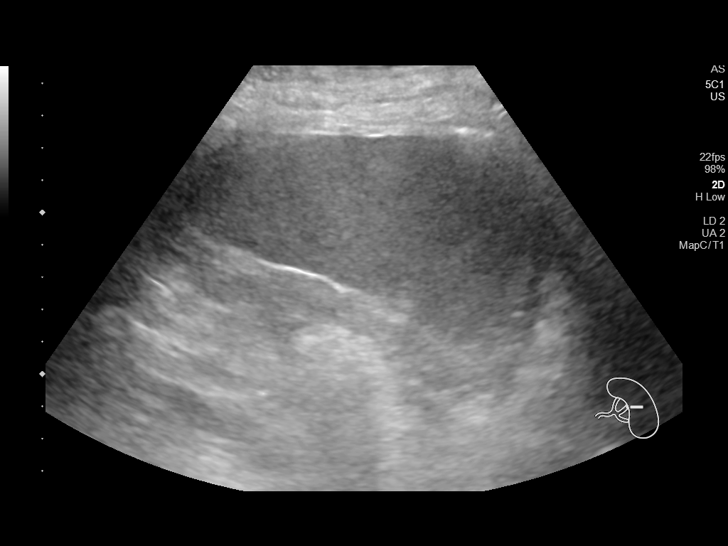
[im 58/77]
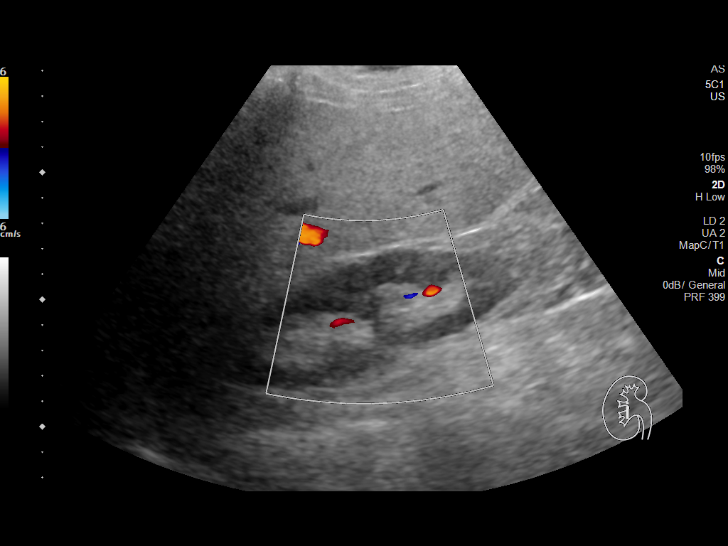
[im 64/77]
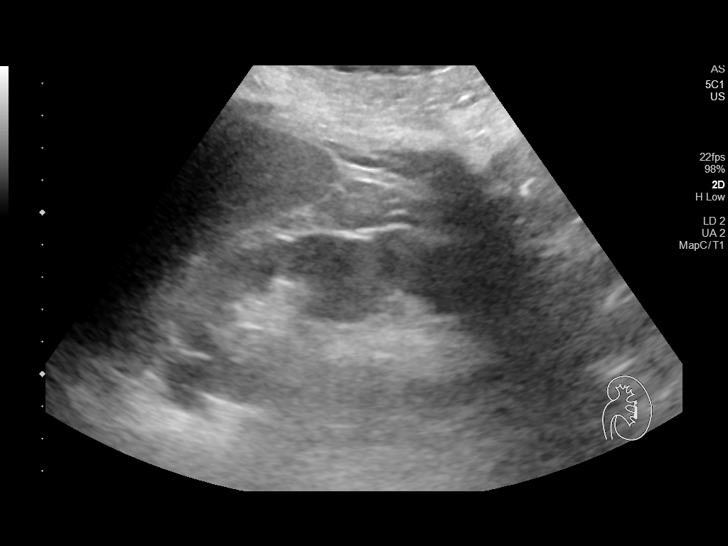
[im 70/77]
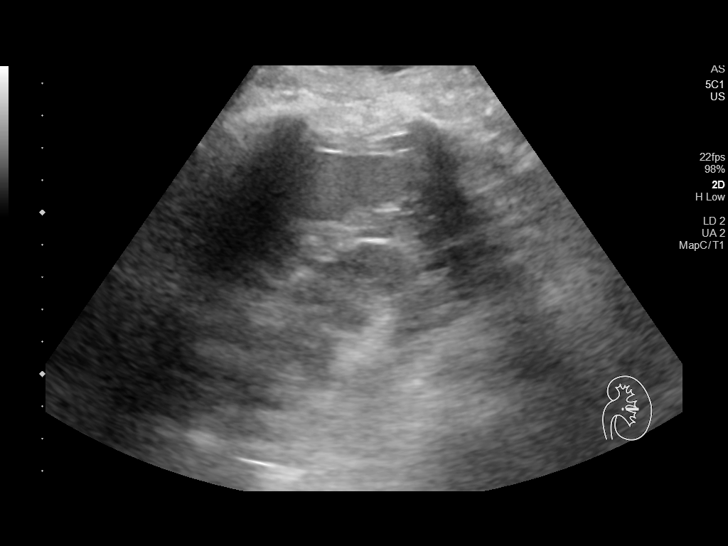
[im 77/77]
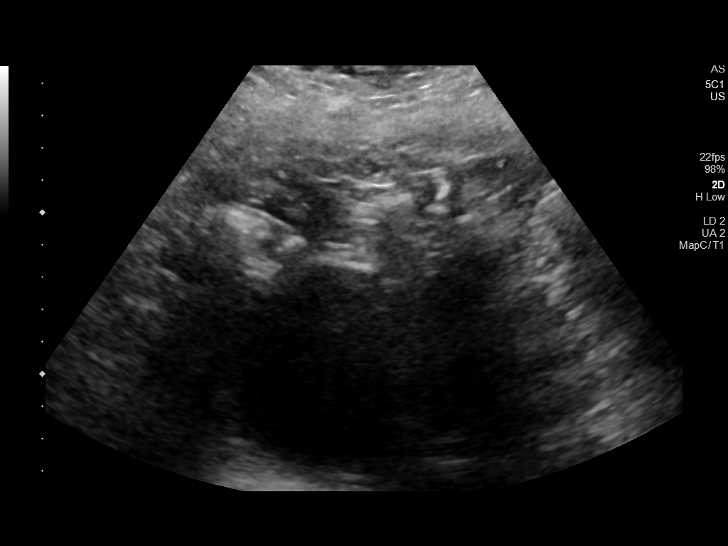

[14 of 25 positions shown; findings below may reference images not displayed]

FINDINGS: Gallbladder: 9 mm polyp in the fundus of the gallbladder. No
gallstone or gallbladder wall thickening. No pain over the
gallbladder

Common bile duct: Diameter: 4.9 mm

Liver: Diffusely increased echogenicity of the liver without focal
liver lesion. Liver does not appear to be enlarged. Portal vein is
patent on color Doppler imaging with normal direction of blood flow
towards the liver.

IVC: No abnormality visualized.

Pancreas: Visualized portion unremarkable.

Spleen: Size and appearance within normal limits. Spleen 8.7 cm in
length.

Right Kidney: Length: 11.1 cm. Echogenicity within normal limits. No
mass or hydronephrosis visualized.

Left Kidney: Length: 11.4 cm. Echogenicity within normal limits. No
mass or hydronephrosis visualized.

Abdominal aorta: No aneurysm visualized.

Other findings: None.
IMPRESSION: 9 mm gallbladder polyp.  No gallstones

Echogenic liver suggesting fatty infiltration. Liver does not appear
subjectively be enlarged. Spleen not enlarged.

## 2021-05-07 ENCOUNTER — Ambulatory Visit: Payer: Medicare HMO | Admitting: Gastroenterology

## 2021-05-11 ENCOUNTER — Ambulatory Visit (INDEPENDENT_AMBULATORY_CARE_PROVIDER_SITE_OTHER): Payer: Medicare HMO | Admitting: Gastroenterology

## 2021-05-11 ENCOUNTER — Encounter: Payer: Self-pay | Admitting: Gastroenterology

## 2021-05-11 VITALS — BP 132/82 | HR 91 | Ht 62.0 in | Wt 206.0 lb

## 2021-05-11 DIAGNOSIS — Z8719 Personal history of other diseases of the digestive system: Secondary | ICD-10-CM | POA: Diagnosis not present

## 2021-05-11 DIAGNOSIS — K3182 Dieulafoy lesion (hemorrhagic) of stomach and duodenum: Secondary | ICD-10-CM

## 2021-05-11 DIAGNOSIS — R195 Other fecal abnormalities: Secondary | ICD-10-CM

## 2021-05-11 DIAGNOSIS — R110 Nausea: Secondary | ICD-10-CM | POA: Diagnosis not present

## 2021-05-11 DIAGNOSIS — Z8711 Personal history of peptic ulcer disease: Secondary | ICD-10-CM | POA: Diagnosis not present

## 2021-05-11 NOTE — Patient Instructions (Addendum)
If you are age 76 or older, your body mass index should be between 23-30. Your Body mass index is 37.68 kg/m?Marland Kitchen If this is out of the aforementioned range listed, please consider follow up with your Primary Care Provider. ? ?If you are age 41 or younger, your body mass index should be between 19-25. Your Body mass index is 37.68 kg/m?Marland Kitchen If this is out of the aformentioned range listed, please consider follow up with your Primary Care Provider.  ? ?________________________________________________________ ? ?The [facility] providers would like to encourage you to use [facility] to communicate with providers for non-urgent requests or questions.  Due to long hold times on the telephone, sending your provider a message by [facility] may be a faster and more efficient way to get a response.  Please allow 48 business hours for a response.  Please remember that this is for non-urgent requests.  ?_______________________________________________________ ? ?You have been scheduled for an endoscopy. Please follow written instructions given to you at your visit today. ?If you use inhalers (even only as needed), please bring them with you on the day of your procedure. ? ? ?It was a pleasure to see you today! ? ?Gerrit Heck, D.O. ? ? ? ? ?We want to thank you for trusting [facility] with your care. All of our staff and providers value the relationships we have built with our patients, and it is an honor to care for you.  ? ?We are writing to let you know that [facility] will close on May 25, 2021, and we invite you to continue to see Dr. Carmell Austria and Gerrit Heck at the [facility] [place] office location. We are consolidating our serices at these [facility] practices to better provide care. Our office staff will work with you to ensure a seamless transition.  ? ?Gerrit Heck, DO -Dr. Bryan Lemma will be movig to [facility] at [address],  [place], [place] [address], effective May 25, 2021.  Contact [phone] to schedule an appointment with him.  ? ?Carmell Austria, MD- Dr. Lyndel Safe will be movig to [facility] at [address], [place], [place] [address], effective May 25, 2021.  Contact [phone] to schedule an appointment with him.  ? ?Requesting Medical Records ?If you need to request your medical records, please follow the instructions below. Your medical records are confidential, and a copy can be transferred to another provider or released to you or another person you designate only with your permission. ? ?There are several ways to request your medical records: ?Requests for medical records can be submitted through our practice.   ?You can also request your records electronically, in your MyChart account by selecting the ?Request Health Records? tab.  ?If you need additional information on how to request records, please go to [email or web address] choose Patient Information, then select Request Medical Records. ?To make an appointment or if you have any questions about your health care needs, please contact our office at [phone] and one of our staff members will be glad to assist you. ?[place] is committed to providing exceptional care for you and our community. Thank you for allowing [place] to serve your health care needs. ?Sincerely, ? ?Windy Canny, Director [facility] ? also offers convenient virtual care options. Sore throat? Sinus problems? Cold or flu symptoms? Get care from the comfort of home with [facility] Video Visits and e-Visits. Learn more about the non-emergency conditions treated and start your virtual visit  at [email or web address] ? ?

## 2021-05-11 NOTE — Progress Notes (Signed)
? ?Chief Complaint:    Dark stools, nausea ? ?GI History: 76 year old female Dana Nielsen with history of diabetes, hyperlipidemia, hypothyroidism, IDA, admitted in 02/2019 with fatigue, melena, and anemia.  Prior history of obscure GI bleeding with prior extensive evaluation in [place], [place].  Reports having a negative/normal EGD, colonoscopy, VCE (no records for review).  History of IDA, previously followed with Hematology in [place], [place], treated with IV iron. ? ?- 03/08/2019: Inpatient push enteroscopy: 2 cm HH, 10 mm cratered gastric antral ulcer (clips x2), moderate gastritis with erosions (biopsies negative for H. pylori), residual food in stomach, active oozing Dieulafoy lesion in third portion of duodenum (clips x1; tattoo placed 1-2 cm distal to the lesion for future identification).  Was treated with IV iron infusion, high-dose PPI, sucralfate and discharged the following day. ?- 04/03/2019: Follow-up in the GI clinic.  No overt bleeding.  Uptrending H/H. ? ?HPI:   ? ? ?Patient is a 76 y.o. female presenting to the Gastroenterology Clinic for evaluation of dark stools and nausea.  Last seen by me on 04/03/2019 for hospital follow-up as outlined above.  No endoscopic procedures since then. ? ?She states she has had intermittent nausea without emesis for the last 2 months or so.  Does have intermittent dark stools.  No hematochezia. No abdominal pain.  Nausea tends to be postprandial, but no specific food triggers. Also with medications, so has delayed some of her medications.  ? ?Has had all teeth pulled recently, and getting fitted for dentures next month.  ? ?Reviewed most recent labs from 01/2021 n/f normal CBC, CMP, iron panel.  Was last seen by Dr. Lorenso Courier in the Hematology clinic on 02/02/2021. ? ? ?  Latest Ref Rng & Units 02/02/2021  ?  2:09 PM 01/18/2020  ? 11:10 AM 10/19/2019  ? 11:45 AM  ?CBC  ?WBC 4.0 - 10.5 K/uL 7.1   7.0   6.9    ?Hemoglobin 12.0 - 15.0 g/dL 12.0   12.7   12.3    ?Hematocrit  36.0 - 46.0 % 37.9   39.0   38.5    ?Platelets 150 - 400 K/uL 202   227   217    ? ? ? ?  Latest Ref Rng & Units 02/02/2021  ?  2:09 PM 01/18/2020  ? 11:10 AM 07/19/2019  ? 10:22 AM  ?CMP  ?Glucose 70 - 99 mg/dL 169   108   105    ?BUN 8 - 23 mg/dL _0 ?Creatinine 0.44 - 1.00 mg/dL 0.82   0.81   0.81    ?Sodium 135 - 145 mmol/L 140   141   146    ?Potassium 3.5 - 5.1 mmol/L 3.8   4.1   4.0    ?Chloride 98 - 111 mmol/L 104   106   109    ?CO2 22 - 32 mmol/L _1 ?Calcium 8.9 - 10.3 mg/dL 9.7   10.0   9.7    ?Total Protein 6.5 - 8.1 g/dL 7.0   7.6   7.3    ?Total Bilirubin 0.3 - 1.2 mg/dL 0.3   0.5   0.3    ?Alkaline Phos 38 - 126 U/L 62   60   61    ?AST 15 - 41 U/L _2 ?ALT 0 - 44 U/L 17   19  23    ? ? ? ? ?Review of systems:     No chest pain, no SOB, no fevers, no urinary sx  ? ?Past Medical History:  ?Diagnosis Date  ? Diabetes mellitus type 2, diet-controlled ([place])   ? Family history of colon cancer 12/09/2020  ? Family history of lung cancer 12/09/2020  ? Family history of multiple myeloma 12/09/2020  ? HLD (hyperlipidemia)   ? Hypothyroidism   ? Iron deficiency anemia   ? ? ?Patient's surgical history, family medical history, social history, medications and allergies were all reviewed in Epic  ? ? ?Current Outpatient Medications  ?Medication Sig Dispense Refill  ? ACCU-CHEK AVIVA PLUS test strip     ? EQ ALLERGY RELIEF, CETIRIZINE, 10 MG tablet Take 1 tablet by mouth once daily 90 tablet 0  ? furosemide (LASIX) 20 MG tablet Take 20 mg by mouth daily.    ? losartan (COZAAR) 25 MG tablet Take 25 mg by mouth daily.    ? magnesium oxide (MAG-OX) 400 (240 Mg) MG tablet Take 1 tablet (400 mg total) by mouth daily. 30 tablet 1  ? metFORMIN (GLUCOPHAGE) 1000 MG tablet Take 1,000 mg by mouth 2 (two) times daily with a meal.    ? simvastatin (ZOCOR) 40 MG tablet Take 1 tablet (40 mg total) by mouth daily at 6 PM. 90 tablet 3  ? SYNTHROID 100 MCG tablet Take 100 mcg by mouth daily before  breakfast.    ? traZODone (DESYREL) 100 MG tablet TAKE 1 TABLET BY MOUTH AT BEDTIME 90 tablet 0  ? venlafaxine (EFFEXOR) 75 MG tablet Take 75 mg by mouth daily.     ? vitamin B-12 (CYANOCOBALAMIN) 100 MCG tablet 1 tablet    ? pantoprazole (PROTONIX) 40 MG tablet Take 1 tablet (40 mg total) by mouth 2 (two) times daily. 112 tablet 0  ? ?No current facility-administered medications for this visit.  ? ? ?Physical Exam:   ? ? ?BP 132/82   Pulse 91   Ht _0  (1.575 m)   Wt 206 lb (93.4 kg)   SpO2 97%   BMI 37.68 kg/m?  ? ?GENERAL:  Pleasant female in NAD ?PSYCH: : Cooperative, normal affect ?Musculoskeletal:  Normal muscle tone, normal strength ?NEURO: Alert and oriented x 3, no focal neurologic deficits ? ? ?IMPRESSION and PLAN:   ? ?1) Nausea without emesis ?2) Dark stools ?3) History of gastric ulcer ?4) History of gastritis with erosions ?5) History of Dieulafoy lesion (duodenum) s/p endoscopic intervention ? ?- Discussed DDx for presenting symptoms to include gastritis, PUD, H. pylori, along with non-GI etiology ?- EGD to evaluate for mucosal/luminal pathology ?- Could benefit from small, frequent meals, using Ensure, boost, etc. as meal replacement for 1-2 with meals ? ?6) Poor dentition ?- Recently had all of her teeth pulled.  Getting fitted for dentures next week.  Could be contributory ? ?The indications, risks, and benefits of EGD were explained to the patient in detail. Risks include but are not limited to bleeding, perforation, adverse reaction to medications, and cardiopulmonary compromise. Sequelae include but are not limited to the possibility of surgery, hospitalization, and mortality. The patient verbalized understanding and wished to proceed. All questions answered, referred to scheduler. Further recommendations pending results of the exam.  ? ? ?Dana Nielsen ,DO, FACG 05/11/2021, 1:39 PM ? ?

## 2021-05-15 ENCOUNTER — Ambulatory Visit (AMBULATORY_SURGERY_CENTER): Payer: Medicare HMO | Admitting: Gastroenterology

## 2021-05-15 ENCOUNTER — Encounter: Payer: Self-pay | Admitting: Gastroenterology

## 2021-05-15 VITALS — BP 110/61 | HR 81 | Temp 98.7°F | Resp 16 | Ht 62.0 in | Wt 206.0 lb

## 2021-05-15 DIAGNOSIS — K317 Polyp of stomach and duodenum: Secondary | ICD-10-CM | POA: Diagnosis not present

## 2021-05-15 DIAGNOSIS — Z8711 Personal history of peptic ulcer disease: Secondary | ICD-10-CM

## 2021-05-15 DIAGNOSIS — R110 Nausea: Secondary | ICD-10-CM

## 2021-05-15 DIAGNOSIS — Z8719 Personal history of other diseases of the digestive system: Secondary | ICD-10-CM | POA: Diagnosis not present

## 2021-05-15 MED ORDER — SODIUM CHLORIDE 0.9 % IV SOLN: 500.0000 mL | Freq: Once | INTRAVENOUS | Status: DC

## 2021-05-15 NOTE — Progress Notes (Signed)
Called to room to assist during endoscopic procedure.  Patient ID and intended procedure confirmed with present staff. Received instructions for my participation in the procedure from the performing physician.  

## 2021-05-15 NOTE — Progress Notes (Signed)
Pt's states no medical or surgical changes since previsit or office visit. 

## 2021-05-15 NOTE — Progress Notes (Signed)
Report given to PACU, vss 

## 2021-05-15 NOTE — Patient Instructions (Signed)
Await pathology results. ? ?YOU HAD AN ENDOSCOPIC PROCEDURE TODAY AT [facility]:   Refer to the procedure report that was given to you for any specific questions about what was found during the examination.  If the procedure report does not answer your questions, please call your gastroenterologist to clarify.  If you requested that your care partner not be given the details of your procedure findings, then the procedure report has been included in a sealed envelope for you to review at your convenience later. ? ?YOU SHOULD EXPECT: Some feelings of bloating in the abdomen. Passage of more gas than usual.  Walking can help get rid of the air that was put into your GI tract during the procedure and reduce the bloating. If you had a lower endoscopy (such as a colonoscopy or flexible sigmoidoscopy) you may notice spotting of blood in your stool or on the toilet paper. If you underwent a bowel prep for your procedure, you may not have a normal bowel movement for a few days. ? ?Please Note:  You might notice some irritation and congestion in your nose or some drainage.  This is from the oxygen used during your procedure.  There is no need for concern and it should clear up in a day or so. ? ?SYMPTOMS TO REPORT IMMEDIATELY: ? ?Following upper endoscopy (EGD) ? Vomiting of blood or coffee ground material ? New chest pain or pain under the shoulder blades ? Painful or persistently difficult swallowing ? New shortness of breath ? Fever of 100?F or higher ? Black, tarry-looking stools ? ?For urgent or emergent issues, a gastroenterologist can be reached at any hour by calling [phone]. ?Do not use MyChart messaging for urgent concerns.  ? ? ?DIET:  We do recommend a small meal at first, but then you may proceed to your regular diet.  Drink plenty of fluids but you should avoid alcoholic beverages for 24 hours. ? ?ACTIVITY:  You should plan to take it easy for the rest of today and you should NOT DRIVE  or use heavy machinery until tomorrow (because of the sedation medicines used during the test).   ? ?FOLLOW UP: ?Our staff will call the number listed on your records 48-72 hours following your procedure to check on you and address any questions or concerns that you may have regarding the information given to you following your procedure. If we do not reach you, we will leave a message.  We will attempt to reach you two times.  During this call, we will ask if you have developed any symptoms of COVID 19. If you develop any symptoms (ie: fever, flu-like symptoms, shortness of breath, cough etc.) before then, please call [phone].  If you test positive for Covid 19 in the 2 weeks post procedure, please call and report this information to [place].   ? ?If any biopsies were taken you will be contacted by phone or by letter within the next 1-3 weeks.  Please call us at [phone] if you have not heard about the biopsies in 3 weeks.  ? ? ?SIGNATURES/CONFIDENTIALITY: ?You and/or your care partner have signed paperwork which will be entered into your electronic medical record.  These signatures attest to the fact that that the information above on your After Visit Summary has been reviewed and is understood.  Full responsibility of the confidentiality of this discharge information lies with you and/or your care-partner. ? ?

## 2021-05-15 NOTE — Op Note (Signed)
[facility] ?Patient Name: Dana Nielsen ?Procedure Date: 05/15/2021 3:23 PM ?MRN: [id number] ?Endoscopist: Gerrit Heck , MD ?Age: 76 ?Referring MD:  ?Date of Birth: [birth year] ?Gender: Female ?Account #: [id number] ?Procedure:                Upper GI endoscopy ?Indications:              Follow-up of gastric ulcer, Nausea ?                          Enteroscopy in 02/2019 with 10 mm cratered gastric  ?                          antral ulcer (clips x2), moderate gastritis with  ?                          erosions (biopsies negative for H. pylori),  ?                          residual food in stomach, active  ?                          oozing??Dieulafoy??lesion in third portion of  ?                          duodenum (clipped). More recently with intermittent  ?                          nausea without emesis and intemrittent dark stools  ?                          over the last 2 months or so. No hematochezia. No  ?                          abdominal pain. ?Medicines:                Monitored Anesthesia Care ?Procedure:                Pre-Anesthesia Assessment: ?                          - Prior to the procedure, a History and Physical  ?                          was performed, and patient medications and  ?                          allergies were reviewed. The patient's tolerance of  ?                          previous anesthesia was also reviewed. The risks  ?                          and benefits of the procedure and the sedation  ?  options and risks were discussed with the patient.  ?                          All questions were answered, and informed consent  ?                          was obtained. Prior Anticoagulants: The patient has  ?                          taken no previous anticoagulant or antiplatelet  ?                          agents. ASA Grade Assessment: II - A patient with  ?                          mild systemic disease. After reviewing the risks  ?                           and benefits, the patient was deemed in  ?                          satisfactory condition to undergo the procedure. ?                          After obtaining informed consent, the endoscope was  ?                          passed under direct vision. Throughout the  ?                          procedure, the patient's blood pressure, pulse, and  ?                          oxygen saturations were monitored continuously. The  ?                          Endoscope was introduced through the mouth, and  ?                          advanced to the duodenal bulb. The upper GI  ?                          endoscopy was accomplished without difficulty. The  ?                          patient tolerated the procedure well. ?Scope In: ?Scope Out: ?Findings:                 The examined esophagus was normal. ?                          The Z-line was regular and was found 36 cm from the  ?  incisors. ?                          Multiple small sessile polyps with no bleeding and  ?                          no stigmata of recent bleeding were found in the  ?                          gastric fundus and in the gastric body. These  ?                          polyps were removed with a cold biopsy forceps.  ?                          Resection and retrieval were complete. Estimated  ?                          blood loss was minimal. ?                          The mucosa was otherwise normal throughout the  ?                          stomach. No ulcers or stigmata of recent bleeding  ?                          noted. ?                          The examined duodenum was normal. ?Complications:            No immediate complications. ?Estimated Blood Loss:     Estimated blood loss was minimal. ?Impression:               - Normal esophagus. ?                          - Z-line regular, 36 cm from the incisors. ?                          - Multiple gastric polyps. Resected and retrieved. ?                           - Normal mucosa was found in the entire stomach. ?                          - Normal examined duodenum. ?Recommendation:           - Patient has a contact number available for  ?                          emergencies. The signs and symptoms of potential  ?                          delayed complications were discussed with the  ?  patient. Return to normal activities tomorrow.  ?                          Written discharge instructions were provided to the  ?                          patient. ?                          - Resume previous diet. ?                          - Continue present medications. ?                          - Await pathology results. ?                          - Repeat upper endoscopy PRN. ?                          - Return to GI clinic PRN. ?Gerrit Heck, MD ?05/15/2021 3:56:51 PM ?

## 2021-05-15 NOTE — Progress Notes (Signed)
? ?GASTROENTEROLOGY PROCEDURE H&P NOTE  ? ?Primary Care Physician: ?Glenis Smoker, MD ? ? ? ?Reason for Procedure:  Dark stools, nausea, history of UGI bleed, history of gastric ulcer/gastritis, history of duodenal Dieulafoy lesion requiring endoscopic intervention ? ?Plan:    EGD ? ?Patient is appropriate for endoscopic procedure(s) in the ambulatory ([place]) setting. ? ?The nature of the procedure, as well as the risks, benefits, and alternatives were carefully and thoroughly reviewed with the patient. Ample time for discussion and questions allowed. The patient understood, was satisfied, and agreed to proceed.  ? ? ? ?HPI: ?Dana Nielsen is a 76 y.o. female who presents for EGD for evaluation of nausea, dark stools in the setting of prior gastric ulcer with gastritis/gastric erosions and duodenal Dieulafoy requiring endoscopic intervention in 02/2019.  Patient was most recently seen in the Gastroenterology Clinic on 05/11/2021 by me.  No interval change in medical history since that appointment. Please refer to that note for full details regarding GI history and clinical presentation.  ? ?Past Medical History:  ?Diagnosis Date  ? Diabetes mellitus type 2, diet-controlled ([place])   ? Family history of colon cancer 12/09/2020  ? Family history of lung cancer 12/09/2020  ? Family history of multiple myeloma 12/09/2020  ? HLD (hyperlipidemia)   ? Hypothyroidism   ? Iron deficiency anemia   ? ? ?Past Surgical History:  ?Procedure Laterality Date  ? arthroscopic knee    ? BIOPSY  03/08/2019  ? Procedure: BIOPSY;  Surgeon: Lavena Bullion, DO;  Location: [facility];  Service: Gastroenterology;;  ? C sections    ? x 7  ? COLONOSCOPY  2019  ? x2 At the [facility] and in [place]  ? ENTEROSCOPY N/A 03/08/2019  ? Procedure: ENTEROSCOPY;  Surgeon: Lavena Bullion, DO;  Location: [facility];  Service: Gastroenterology;  Laterality: N/A;  ? HEMOSTASIS CLIP PLACEMENT  03/08/2019  ? Procedure: HEMOSTASIS CLIP  PLACEMENT;  Surgeon: Lavena Bullion, DO;  Location: [facility];  Service: Gastroenterology;;  ? SUBMUCOSAL TATTOO INJECTION  03/08/2019  ? Procedure: SUBMUCOSAL TATTOO INJECTION;  Surgeon: Lavena Bullion, DO;  Location: [facility];  Service: Gastroenterology;;  ? TONSILLECTOMY    ? WISDOM TOOTH EXTRACTION    ? WRIST SURGERY    ? ? ?Prior to Admission medications   ?Medication Sig Start Date End Date Taking? Authorizing Provider  ?ACCU-CHEK AVIVA PLUS test strip  03/09/19  Yes [provider]  ?EQ ALLERGY RELIEF, CETIRIZINE, 10 MG tablet Take 1 tablet by mouth once daily 12/12/19  Yes Kennyth Arnold, FNP  ?furosemide (LASIX) 20 MG tablet Take 20 mg by mouth daily.   Yes [provider]  ?losartan (COZAAR) 25 MG tablet Take 25 mg by mouth daily.   Yes [provider]  ?magnesium oxide (MAG-OX) 400 (240 Mg) MG tablet Take 1 tablet (400 mg total) by mouth daily. 02/11/21  Yes Orson Slick, MD  ?metFORMIN (GLUCOPHAGE) 1000 MG tablet Take 1,000 mg by mouth 2 (two) times daily with a meal.   Yes [provider]  ?simvastatin (ZOCOR) 40 MG tablet Take 1 tablet (40 mg total) by mouth daily at 6 PM. 04/06/19  Yes Libby Maw, MD  ?SYNTHROID 100 MCG tablet Take 100 mcg by mouth daily before breakfast.   Yes [provider]  ?traZODone (DESYREL) 100 MG tablet TAKE 1 TABLET BY MOUTH AT BEDTIME 09/20/19  Yes Libby Maw, MD  ?venlafaxine (EFFEXOR) 75 MG tablet Take 75  mg by mouth daily.    Yes [provider]  ?pantoprazole (PROTONIX) 40 MG tablet Take 1 tablet (40 mg total) by mouth 2 (two) times daily. 03/09/19 05/04/19  [place] Eric J, DO  ?vitamin B-12 (CYANOCOBALAMIN) 100 MCG tablet 1 tablet    [provider]  ? ? ?Current Outpatient Medications  ?Medication Sig Dispense Refill  ? ACCU-CHEK AVIVA PLUS test strip     ? EQ ALLERGY RELIEF, CETIRIZINE, 10 MG tablet Take 1 tablet by mouth once daily 90 tablet 0  ? furosemide (LASIX) 20  MG tablet Take 20 mg by mouth daily.    ? losartan (COZAAR) 25 MG tablet Take 25 mg by mouth daily.    ? magnesium oxide (MAG-OX) 400 (240 Mg) MG tablet Take 1 tablet (400 mg total) by mouth daily. 30 tablet 1  ? metFORMIN (GLUCOPHAGE) 1000 MG tablet Take 1,000 mg by mouth 2 (two) times daily with a meal.    ? simvastatin (ZOCOR) 40 MG tablet Take 1 tablet (40 mg total) by mouth daily at 6 PM. 90 tablet 3  ? SYNTHROID 100 MCG tablet Take 100 mcg by mouth daily before breakfast.    ? traZODone (DESYREL) 100 MG tablet TAKE 1 TABLET BY MOUTH AT BEDTIME 90 tablet 0  ? venlafaxine (EFFEXOR) 75 MG tablet Take 75 mg by mouth daily.     ? pantoprazole (PROTONIX) 40 MG tablet Take 1 tablet (40 mg total) by mouth 2 (two) times daily. 112 tablet 0  ? vitamin B-12 (CYANOCOBALAMIN) 100 MCG tablet 1 tablet    ? ?Current Facility-Administered Medications  ?Medication Dose Route Frequency Provider Last Rate Last Admin  ? 0.9 %  sodium chloride infusion  500 mL Intravenous Once Cainen Burnham V, DO      ? ? ?Allergies as of 05/15/2021 - Review Complete 05/15/2021  ?Allergen Reaction Noted  ? Lidocaine Shortness Of Breath 12/14/2018  ? Nitrofurantoin Other (See Comments) 04/06/2021  ? ? ?Family History  ?Problem Relation Age of Onset  ? Multiple myeloma Mother 67  ? Heart attack Father   ? Lung cancer Sister 83  ?     smoking hx  ? Leukemia Paternal Aunt   ?     dx before 57  ? Cancer Paternal Aunt   ?     unknown type; dx after 10  ? Colon cancer Paternal Aunt   ?     dx after 78  ? Head & neck cancer Paternal Uncle   ?     dx before 43  ? Cancer Paternal Uncle   ?     unknown type; dx after 21  ? Cancer Paternal Uncle   ?     color or gastric; dx after 13  ? ? ?Social History  ? ?Socioeconomic History  ? Marital status: Widowed  ?  Spouse name: Not on file  ? Number of children: 9  ? Years of education: Not on file  ? Highest education level: Not on file  ?Occupational History  ? Not on file  ?Tobacco Use  ? Smoking status: Never   ? Smokeless tobacco: Never  ?Vaping Use  ? Vaping Use: Never used  ?Substance and Sexual Activity  ? Alcohol use: Not Currently  ? Drug use: Never  ? Sexual activity: Not on file  ?Other Topics Concern  ? Not on file  ?Social History Narrative  ? Not on file  ? ?Social Determinants of Health  ? ?Financial Resource Strain:  Not on file  ?Food Insecurity: Not on file  ?Transportation Needs: Not on file  ?Physical Activity: Not on file  ?Stress: Not on file  ?Social Connections: Not on file  ?Intimate Partner Violence: Not on file  ? ? ?Physical Exam: ?Vital signs in last 24 hours: ?_0  112/61   Pulse 91   Temp 98.7 ?F (37.1 ?C)   Ht _1  (1.575 m)   Wt 206 lb (93.4 kg)   SpO2 98%   BMI 37.68 kg/m?  ?GEN: NAD ?EYE: Sclerae anicteric ?ENT: MMM ?CV: Non-tachycardic ?Pulm: CTA b/l ?GI: Soft, NT/ND ?NEURO:  Alert & Oriented x 3 ? ? ?Gerrit Heck, DO ?[facility] ? ? ?05/15/2021 3:32 PM ? ?

## 2021-05-15 NOTE — Progress Notes (Signed)
1539 Robinul 0.1 mg IV given due large amount of secretions upon assessment.  MD made aware, vss  ?

## 2021-05-19 ENCOUNTER — Telehealth: Payer: Self-pay

## 2021-05-19 ENCOUNTER — Telehealth: Payer: Self-pay | Admitting: *Deleted

## 2021-05-19 NOTE — Telephone Encounter (Signed)
?  Follow up Call- ? ? ?  05/15/2021  ?  3:27 PM  ?Call back number  ?Post procedure Call Back phone  # [phone]  ?Permission to leave phone message Yes  ?  ? ?Patient questions: ? ?Do you have a fever, pain , or abdominal swelling? No. ?Pain Score  0 * ? ?Have you tolerated food without any problems? Yes.   ? ?Have you been able to return to your normal activities? Yes.   ? ?Do you have any questions about your discharge instructions: ?Diet   No. ?Medications  No. ?Follow up visit  No. ? ?Do you have questions or concerns about your Care? No. ? ?Actions: ?* If pain score is 4 or above: ?No action needed, pain <4. ? ? ?

## 2021-05-19 NOTE — Telephone Encounter (Signed)
Attempted f/u phone call. No answer. Left message. °

## 2021-05-27 ENCOUNTER — Encounter: Payer: Self-pay | Admitting: Gastroenterology

## 2021-06-24 ENCOUNTER — Ambulatory Visit: Payer: Medicare HMO | Admitting: Podiatry

## 2021-06-24 DIAGNOSIS — M2042 Other hammer toe(s) (acquired), left foot: Secondary | ICD-10-CM | POA: Diagnosis not present

## 2021-06-24 DIAGNOSIS — M2041 Other hammer toe(s) (acquired), right foot: Secondary | ICD-10-CM | POA: Diagnosis not present

## 2021-06-24 DIAGNOSIS — E119 Type 2 diabetes mellitus without complications: Secondary | ICD-10-CM | POA: Diagnosis not present

## 2021-06-24 DIAGNOSIS — E118 Type 2 diabetes mellitus with unspecified complications: Secondary | ICD-10-CM

## 2021-06-26 NOTE — Progress Notes (Signed)
Subjective: Dana Nielsen presents today referred by Glenis Smoker, MD for diabetic foot evaluation.  Patient relates many year history of diabetes.  Patient denies any history of foot wounds.  Patient denies any history of numbness, tingling, burning, pins/needles sensations.  Past Medical History:  Diagnosis Date   Diabetes mellitus type 2, diet-controlled ([place])    Family history of colon cancer 12/09/2020   Family history of lung cancer 12/09/2020   Family history of multiple myeloma 12/09/2020   HLD (hyperlipidemia)    Hypothyroidism    Iron deficiency anemia     Patient Active Problem List   Diagnosis Date Noted   Family history of lung cancer 12/09/2020   Family history of colon cancer 12/09/2020   Family history of multiple myeloma 12/09/2020   Seasonal allergic rhinitis due to pollen 04/06/2019   Arterial hypotension 03/20/2019   Labyrinthitis 03/20/2019   Iron deficiency anemia due to chronic blood loss 03/09/2019   Dieulafoy lesion of duodenum    Gastric ulcer without hemorrhage or perforation    Gastritis and gastroduodenitis    Hiatal hernia    GI bleeding 03/07/2019   Blood loss anemia 03/07/2019   Melena 03/05/2019   Primary insomnia 12/14/2018   Patient is Jehovah's Witness 12/14/2018   Iron deficiency anemia 12/14/2018   Hypothyroidism 12/14/2018   Depression, major, single episode, mild ([place]) 12/14/2018   Controlled type 2 diabetes mellitus with complication, without long-term current use of insulin ([place]) 12/14/2018   Essential hypertension 12/14/2018   Vitamin D deficiency 12/14/2018   Elevated cholesterol 12/14/2018    Past Surgical History:  Procedure Laterality Date   arthroscopic knee     BIOPSY  03/08/2019   Procedure: BIOPSY;  Surgeon: Lavena Bullion, DO;  [facility];  Service: Gastroenterology;;   C sections     x 7   COLONOSCOPY  2019   x2 At the [facility] and in [place]   ENTEROSCOPY N/A 03/08/2019    Procedure: ENTEROSCOPY;  Surgeon: Lavena Bullion, DO;  [facility];  Service: Gastroenterology;  Laterality: N/A;   HEMOSTASIS CLIP PLACEMENT  03/08/2019   Procedure: HEMOSTASIS CLIP PLACEMENT;  Surgeon: Lavena Bullion, DO;  [facility];  Service: Gastroenterology;;   SUBMUCOSAL TATTOO INJECTION  03/08/2019   Procedure: SUBMUCOSAL TATTOO INJECTION;  Surgeon: Lavena Bullion, DO;  [facility];  Service: Gastroenterology;;   TONSILLECTOMY     WISDOM TOOTH EXTRACTION     WRIST SURGERY      Current Outpatient Medications on File Prior to Visit  Medication Sig Dispense Refill   ACCU-CHEK AVIVA PLUS test strip      EQ ALLERGY RELIEF, CETIRIZINE, 10 MG tablet Take 1 tablet by mouth once daily 90 tablet 0   furosemide (LASIX) 20 MG tablet Take 20 mg by mouth daily.     losartan (COZAAR) 25 MG tablet Take 25 mg by mouth daily.     magnesium oxide (MAG-OX) 400 (240 Mg) MG tablet Take 1 tablet (400 mg total) by mouth daily. 30 tablet 1   metFORMIN (GLUCOPHAGE) 1000 MG tablet Take 1,000 mg by mouth 2 (two) times daily with a meal.     pantoprazole (PROTONIX) 40 MG tablet Take 1 tablet (40 mg total) by mouth 2 (two) times daily. 112 tablet 0   simvastatin (ZOCOR) 40 MG tablet Take 1 tablet (40 mg total) by mouth daily at 6 PM. 90 tablet 3   SYNTHROID 100 MCG tablet Take 100 mcg by mouth  daily before breakfast.     traZODone (DESYREL) 100 MG tablet TAKE 1 TABLET BY MOUTH AT BEDTIME 90 tablet 0   venlafaxine (EFFEXOR) 75 MG tablet Take 75 mg by mouth daily.      vitamin B-12 (CYANOCOBALAMIN) 100 MCG tablet 1 tablet     No current facility-administered medications on file prior to visit.     Allergies  Allergen Reactions   Lidocaine Shortness Of Breath   Nitrofurantoin Other (See Comments)    Headache, confusion, dizziness, and muscle pain    Social History   Occupational History   Not on file  Tobacco Use   Smoking status: Never   Smokeless tobacco:  Never  Vaping Use   Vaping Use: Never used  Substance and Sexual Activity   Alcohol use: Not Currently   Drug use: Never   Sexual activity: Not on file    Family History  Problem Relation Age of Onset   Multiple myeloma Mother 74   Heart attack Father    Lung cancer Sister 25       smoking hx   Leukemia Paternal Aunt        dx before 56   Cancer Paternal Aunt        unknown type; dx after 44   Colon cancer Paternal Aunt        dx after 48   Head & neck cancer Paternal Uncle        dx before 67   Cancer Paternal Uncle        unknown type; dx after 32   Cancer Paternal Uncle        color or gastric; dx after 75    Immunization History  Administered Date(s) Administered   Influenza,inj,Quad PF,6+ Mos 10/29/2019   Influenza-Unspecified 11/10/2018   PFIZER(Purple Top)SARS-COV-2 Vaccination 02/16/2019, 03/27/2019, 10/22/2019    Review of systems: Positive Findings in bold print.  Constitutional:  chills, fatigue, fever, sweats, weight change Communication: Optometrist, sign Ecologist, hand writing, iPad/Android device Head: headaches, head injury Eyes: changes in vision, eye pain, glaucoma, cataracts, macular degeneration, diplopia, glare,  light sensitivity, eyeglasses or contacts, blindness Ears nose mouth throat: hearing impaired, hearing aids,  ringing in ears, deaf, sign language,  vertigo, nosebleeds,  rhinitis,  cold sores, snoring, swollen glands Cardiovascular: HTN, edema, arrhythmia, pacemaker in place, defibrillator in place, chest pain/tightness, chronic anticoagulation, blood clot, heart failure, MI Peripheral Vascular: leg cramps, varicose veins, blood clots, lymphedema, varicosities Respiratory:  asthma, difficulty breathing, denies congestion, SOB, wheezing, cough, emphysema Gastrointestinal: change in appetite or weight, abdominal pain, constipation, diarrhea, nausea, vomiting, vomiting blood, change in bowel habits, abdominal pain, jaundice, rectal  bleeding, hemorrhoids, GERD Genitourinary:  nocturia,  pain on urination, polyuria,  blood in urine, Foley catheter, urinary urgency, ESRD on hemodialysis Musculoskeletal: amputation, cramping, stiff joints, painful joints, decreased joint motion, fractures, OA, gout, hemiplegia, paraplegia, uses cane, wheelchair bound, uses walker, uses rollator Skin: +changes in toenails, color change, dryness, itching, mole changes,  rash, wound(s) Neurological: headaches, numbness in feet, paresthesias in feet, burning in feet, fainting,  seizures, change in speech, migraines, memory problems/poor historian, cerebral palsy, weakness, paralysis, CVA, TIA Endocrine: diabetes, hypothyroidism, hyperthyroidism,  goiter, dry mouth, flushing, heat intolerance, cold intolerance,  excessive thirst, denies polyuria,  nocturia Hematological:  easy bleeding, excessive bleeding, easy bruising, enlarged lymph nodes, on long term blood thinner, history of past transusions Allergy/immunological:  hives, eczema, frequent infections, multiple drug allergies, seasonal allergies, transplant recipient, multiple food allergies Psychiatric:  anxiety,  depression, mood disorder, suicidal ideations, hallucinations, insomnia  Objective: There were no vitals filed for this visit. Vascular Examination: Capillary refill time less than 5 seconds x 10 digits.  Dorsalis pedis pulses palpable 2 out of 4.  Posterior tibial pulses palpable 2 out of 4.  Digital hair not present x 10 digits.  Skin temperature gradient WNL b/l.  Dermatological Examination: Skin with normal turgor, texture and tone b/l  Toenails 1-5 b/l discolored, thick, dystrophic with subungual debris and pain with palpation to nailbeds due to thickness of nails.  Musculoskeletal: Muscle strength 5/5 to all LE muscle groups.  Neurological: Sensation intact with 10 gram monofilament.  Vibratory sensation intact.  Assessment: NIDDM Encounter for diabetic foot  examination Hammertoe bilateral  Plan: Discussed diabetic foot care principles. Literature dispensed on today. Patient to continue soft, supportive shoe gear daily. Patient to report any pedal injuries to medical professional immediately. Follow up one year. Patient/POA to call should there be a concern in the interim. Given that patient has hammertoe presentation bilateral in setting of diabetes I believe patient would benefit from diabetic shoes.  She will be scheduled for diabetic shoes

## 2021-06-30 ENCOUNTER — Ambulatory Visit: Payer: Medicare HMO

## 2021-06-30 DIAGNOSIS — E118 Type 2 diabetes mellitus with unspecified complications: Secondary | ICD-10-CM

## 2021-06-30 DIAGNOSIS — M2041 Other hammer toe(s) (acquired), right foot: Secondary | ICD-10-CM

## 2021-06-30 NOTE — Progress Notes (Signed)
SITUATION Reason for Consult: Evaluation for Prefabricated Diabetic Shoes and Custom Diabetic Inserts. Patient / Caregiver Report: Patient would like well fitting shoes  OBJECTIVE DATA: Patient History / Diagnosis:    ICD-10-CM   1. Controlled type 2 diabetes mellitus with complication, without long-term current use of insulin (H[facility])  E11.8     2. Hammertoe, bilateral  M20.41    M20.42       Physician Treating Diabetes:  Thomes Dinning  Current or Previous Devices:   Historical user  In-Person Foot Examination: Ulcers & Callousing:   None Deformities:    Hammertoes Sensation:    Intact  Shoe Size:     8XW  ORTHOTIC RECOMMENDATION Recommended Devices: - 1x pair prefabricated PDAC approved diabetic shoes; Patient Selected Orthofeet 984 Blue Size 8XW - 3x pair custom-to-patient PDAC approved vacuum formed diabetic insoles.  GOALS OF SHOES AND INSOLES - Reduce shear and pressure - Reduce / Prevent callus formation - Reduce / Prevent ulceration - Protect the fragile healing compromised diabetic foot.  Patient would benefit from diabetic shoes and inserts as patient has diabetes mellitus and the patient has one or more of the following conditions: - History of partial or complete amputation of the foot - History of previous foot ulceration. - History of pre-ulcerative callus - Peripheral neuropathy with evidence of callus formation - Foot deformity - Poor circulation  ACTIONS PERFORMED Potential out of pocket cost was communicated to patient. Patient understood and consented to measurement and casting. Patient was casted for insoles via crush box and measured for shoes via brannock device. Procedure was explained and patient tolerated procedure well. All questions were answered and concerns addressed. Casts were shipped to central fabrication for HOLD until Certificate of Medical Necessity or otherwise necessary authorization from insurance is obtained.  PLAN Shoes are to  be ordered and casts released from hold once all appropriate paperwork is complete. Patient is to be contacted and scheduled for fitting once shoes and insoles have been fabricated and received.

## 2021-08-02 ENCOUNTER — Other Ambulatory Visit: Payer: Self-pay | Admitting: Hematology and Oncology

## 2021-08-02 DIAGNOSIS — D500 Iron deficiency anemia secondary to blood loss (chronic): Secondary | ICD-10-CM

## 2021-08-03 ENCOUNTER — Inpatient Hospital Stay: Payer: Medicare HMO | Attending: Hematology and Oncology

## 2021-08-03 ENCOUNTER — Other Ambulatory Visit: Payer: Self-pay | Admitting: *Deleted

## 2021-08-03 ENCOUNTER — Other Ambulatory Visit: Payer: Self-pay

## 2021-08-03 ENCOUNTER — Inpatient Hospital Stay (HOSPITAL_BASED_OUTPATIENT_CLINIC_OR_DEPARTMENT_OTHER): Payer: Medicare HMO | Admitting: Hematology and Oncology

## 2021-08-03 VITALS — BP 140/66 | HR 93 | Temp 98.6°F | Resp 15 | Wt 199.5 lb

## 2021-08-03 DIAGNOSIS — D500 Iron deficiency anemia secondary to blood loss (chronic): Secondary | ICD-10-CM | POA: Diagnosis not present

## 2021-08-03 DIAGNOSIS — E119 Type 2 diabetes mellitus without complications: Secondary | ICD-10-CM | POA: Diagnosis not present

## 2021-08-03 DIAGNOSIS — Z807 Family history of other malignant neoplasms of lymphoid, hematopoietic and related tissues: Secondary | ICD-10-CM | POA: Insufficient documentation

## 2021-08-03 DIAGNOSIS — Z806 Family history of leukemia: Secondary | ICD-10-CM | POA: Insufficient documentation

## 2021-08-03 DIAGNOSIS — Z800 Family history of malignant neoplasm of digestive organs: Secondary | ICD-10-CM | POA: Diagnosis not present

## 2021-08-03 DIAGNOSIS — Z801 Family history of malignant neoplasm of trachea, bronchus and lung: Secondary | ICD-10-CM | POA: Insufficient documentation

## 2021-08-03 DIAGNOSIS — E785 Hyperlipidemia, unspecified: Secondary | ICD-10-CM | POA: Diagnosis not present

## 2021-08-03 LAB — RETIC PANEL
Immature Retic Fract: 17.9 % — ABNORMAL HIGH (ref 2.3–15.9)
RBC.: 3.97 MIL/uL (ref 3.87–5.11)
Retic Count, Absolute: 61.1 10*3/uL (ref 19.0–186.0)
Retic Ct Pct: 1.5 % (ref 0.4–3.1)
Reticulocyte Hemoglobin: 31.6 pg (ref >27.9)

## 2021-08-03 LAB — CBC WITH DIFFERENTIAL (CANCER CENTER ONLY)
Abs Immature Granulocytes: 0.03 10*3/uL (ref 0.00–0.07)
Basophils Absolute: 0.1 10*3/uL (ref 0.0–0.1)
Basophils Relative: 1 %
Eosinophils Absolute: 0.3 10*3/uL (ref 0.0–0.5)
Eosinophils Relative: 3 %
HCT: 36 % (ref 36.0–46.0)
Hemoglobin: 11.7 g/dL — ABNORMAL LOW (ref 12.0–15.0)
Immature Granulocytes: 0 %
Lymphocytes Relative: 26 %
Lymphs Abs: 2.3 10*3/uL (ref 0.7–4.0)
MCH: 29.2 pg (ref 26.0–34.0)
MCHC: 32.5 g/dL (ref 30.0–36.0)
MCV: 89.8 fL (ref 80.0–100.0)
Monocytes Absolute: 1 10*3/uL (ref 0.1–1.0)
Monocytes Relative: 11 %
Neutro Abs: 5.2 10*3/uL (ref 1.7–7.7)
Neutrophils Relative %: 59 %
Platelet Count: 242 10*3/uL (ref 150–400)
RBC: 4.01 MIL/uL (ref 3.87–5.11)
RDW: 13.8 % (ref 11.5–15.5)
WBC Count: 8.9 10*3/uL (ref 4.0–10.5)
nRBC: 0 % (ref 0.0–0.2)

## 2021-08-03 LAB — MAGNESIUM: Magnesium: 1.1 mg/dL — ABNORMAL LOW (ref 1.7–2.4)

## 2021-08-03 LAB — CMP (CANCER CENTER ONLY)
ALT: 20 U/L (ref 0–44)
AST: 21 U/L (ref 15–41)
Albumin: 4.4 g/dL (ref 3.5–5.0)
Alkaline Phosphatase: 51 U/L (ref 38–126)
Anion gap: 7 (ref 5–15)
BUN: 26 mg/dL — ABNORMAL HIGH (ref 8–23)
CO2: 30 mmol/L (ref 22–32)
Calcium: 10.1 mg/dL (ref 8.9–10.3)
Chloride: 105 mmol/L (ref 98–111)
Creatinine: 0.93 mg/dL (ref 0.44–1.00)
GFR, Estimated: >60 mL/min (ref >60)
Glucose, Bld: 107 mg/dL — ABNORMAL HIGH (ref 70–99)
Potassium: 4.2 mmol/L (ref 3.5–5.1)
Sodium: 142 mmol/L (ref 135–145)
Total Bilirubin: 0.3 mg/dL (ref 0.3–1.2)
Total Protein: 7.2 g/dL (ref 6.5–8.1)

## 2021-08-03 LAB — IRON AND IRON BINDING CAPACITY (CC-WL,HP ONLY)
Iron: 54 ug/dL (ref 28–170)
Saturation Ratios: 14 % (ref 10.4–31.8)
TIBC: 389 ug/dL (ref 250–450)
UIBC: 335 ug/dL (ref 148–442)

## 2021-08-03 NOTE — Progress Notes (Signed)
[facility] [phone]   [phone]  PROGRESS NOTE  Patient Care Team: Timberlake, Kathryn S, MD as PCP - General (Family Medicine)  Hematological/Oncological History #Iron Deficiency Anemia 2/2 to GI Bleeding 1) 2019: presented to [facility] in [place] with symptoms of anemia. Found to have Hgb 5.0. EGD/colon/capsule reported found no source of bleed 2) 01/18/2019: establish care with Dr. Saafir Abdullah  3) 03/08/2019: admitted for acute GI bleed. Endoscopy revealed an actively bleeding Dieulafoy lesion that was clipped. Received IV feraheme 510mg x 1 dose during admission 4) 03/15/2019: received dose 2 of IV feraheme 510mg in outpatient setting. 5) 04/13/2019: WBC 8.6, Hgb 12.2, MCV 92.1, Plt 216 6) 07/19/2019: WBC 7.1, Hgb 12.2, MCV 90.6, Plt 226  Interval History:  Dana Nielsen 76 y.o. female with medical history significant for Dieulafoy lesion bleeding with iron deficiency anemia who presents for a follow up visit. The patient's last visit was on 02/02/2021. In the interim since the last visit she has had no further hospitalizations/ED visits or concern for bleeding.  On exam today Dana Nielsen notes she continues to have low levels of energy.  She reports her energy right now is about a 2 out of 10 in severity.  She reports that she does feel tired all the time and dizzy but that she mostly "pushes through it".  She notes that she recently transitioned to a geriatrician.  She notes that she had some recent fluid on her legs which resolved after receiving "2 shots of Lasix".  She notes that she alternates between severe constipation and explosive diarrhea.  She has not been having any difficulty with shortness of breath.  She notes that she "cut out all red meat".  She denies any fevers, chills, sweats, nausea, vomiting or diarrhea.  A full 10 point ROS is listed below.  MEDICAL HISTORY:  Past Medical History:  Diagnosis Date   Diabetes mellitus type 2,  diet-controlled (HCC)    Family history of colon cancer 12/09/2020   Family history of lung cancer 12/09/2020   Family history of multiple myeloma 12/09/2020   HLD (hyperlipidemia)    Hypothyroidism    Iron deficiency anemia     SURGICAL HISTORY: Past Surgical History:  Procedure Laterality Date   arthroscopic knee     BIOPSY  03/08/2019   Procedure: BIOPSY;  Surgeon: Cirigliano, Vito V, DO;  Location: [facility];  Service: Gastroenterology;;   C sections     x 7   COLONOSCOPY  2019   x2 [place] the [place] and in [place]   ENTEROSCOPY N/A 03/08/2019   Procedure: ENTEROSCOPY;  Surgeon: Cirigliano, Vito V, DO;  Location: [facility];  Service: Gastroenterology;  Laterality: N/A;   HEMOSTASIS CLIP PLACEMENT  03/08/2019   Procedure: HEMOSTASIS CLIP PLACEMENT;  Surgeon: Cirigliano, Vito V, DO;  [facility]: [facility];  Service: Gastroenterology;;   SUBMUCOSAL TATTOO INJECTION  03/08/2019   Procedure: SUBMUCOSAL TATTOO INJECTION;  Surgeon: Cirigliano, Vito V, DO;  Location: [facility];  Service: Gastroenterology;;   TONSILLECTOMY     WISDOM TOOTH EXTRACTION     WRIST SURGERY      SOCIAL HISTORY: Social History   Socioeconomic History   Marital status: Widowed    Spouse name: Not on file   Number of children: 9   Years of education: Not on file   Highest education level: Not on file  Occupational History   Not on file  Tobacco Use   Smoking status: Never   Smokeless   tobacco: Never  Vaping Use   Vaping Use: Never used  Substance and Sexual Activity   Alcohol use: Not Currently   Drug use: Never   Sexual activity: Not on file  Other Topics Concern   Not on file  Social History Narrative   Not on file   Social Determinants of Health   Financial Resource Strain: Not on file  Food Insecurity: Not on file  Transportation Needs: Not on file  Physical Activity: Not on file  Stress: Not on file  Social Connections: Not on file  Intimate Partner Violence: Not on  file    FAMILY HISTORY: Family History  Problem Relation Age of Onset   Multiple myeloma Mother 36   Heart attack Father    Lung cancer Sister 59       smoking hx   Leukemia Paternal Aunt        dx before 17   Cancer Paternal Aunt        unknown type; dx after 23   Colon cancer Paternal Aunt        dx after 39   Head & neck cancer Paternal Uncle        dx before 19   Cancer Paternal Uncle        unknown type; dx after 54   Cancer Paternal Uncle        color or gastric; dx after 50    ALLERGIES:  is allergic to lidocaine and nitrofurantoin.  MEDICATIONS:  Current Outpatient Medications  Medication Sig Dispense Refill   ACCU-CHEK AVIVA PLUS test strip      EQ ALLERGY RELIEF, CETIRIZINE, 10 MG tablet Take 1 tablet by mouth once daily 90 tablet 0   furosemide (LASIX) 20 MG tablet Take 20 mg by mouth daily.     losartan (COZAAR) 25 MG tablet Take 25 mg by mouth daily.     magnesium oxide (MAG-OX) 400 (240 Mg) MG tablet Take 1 tablet (400 mg total) by mouth daily. 30 tablet 1   metFORMIN (GLUCOPHAGE) 1000 MG tablet Take 1,000 mg by mouth 2 (two) times daily with a meal.     pantoprazole (PROTONIX) 40 MG tablet Take 1 tablet (40 mg total) by mouth 2 (two) times daily. 112 tablet 0   simvastatin (ZOCOR) 40 MG tablet Take 1 tablet (40 mg total) by mouth daily at 6 PM. 90 tablet 3   SYNTHROID 100 MCG tablet Take 100 mcg by mouth daily before breakfast.     traZODone (DESYREL) 100 MG tablet TAKE 1 TABLET BY MOUTH AT BEDTIME 90 tablet 0   venlafaxine (EFFEXOR) 75 MG tablet Take 75 mg by mouth daily.      vitamin B-12 (CYANOCOBALAMIN) 100 MCG tablet 1 tablet     No current facility-administered medications for this visit.    REVIEW OF SYSTEMS:   Constitutional: ( - ) fevers, ( - )  chills , ( - ) night sweats Eyes: ( - ) blurriness of vision, ( - ) double vision, ( - ) watery eyes Ears, nose, mouth, throat, and face: ( - ) mucositis, ( - ) sore throat Respiratory: ( - ) cough, ( -  ) dyspnea, ( - ) wheezes Cardiovascular: ( - ) palpitation, ( - ) chest discomfort, ( - ) lower extremity swelling Gastrointestinal:  ( - ) nausea, ( - ) heartburn, ( - ) change in bowel habits Skin: ( - ) abnormal skin rashes Lymphatics: ( - ) new lymphadenopathy, ( - )  easy bruising Neurological: ( - ) numbness, ( - ) tingling, ( - ) new weaknesses Behavioral/Psych: ( - ) mood change, ( - ) new changes  All other systems were reviewed with the patient and are negative.  PHYSICAL EXAMINATION: ECOG PERFORMANCE STATUS: 1 - Symptomatic but completely ambulatory  Vitals:   08/03/21 1526  BP: 140/66  Pulse: 93  Resp: 15  Temp: 98.6 F (37 C)  SpO2: 98%    Filed Weights   08/03/21 1526  Weight: 199 lb 8 oz (90.5 kg)     GENERAL:well appearing elderly Caucasian female alert, no distress and comfortable SKIN: skin color, texture, turgor are normal, no rashes or significant lesions EYES: conjunctiva are pink and non-injected, sclera clear LUNGS: clear to auscultation and percussion with normal breathing effort HEART: regular rate & rhythm and no murmurs and no lower extremity edema Musculoskeletal: no cyanosis of digits and no clubbing  PSYCH: alert & oriented x 3, fluent speech NEURO: no focal motor/sensory deficits  LABORATORY DATA:  I have reviewed the data as listed    Latest Ref Rng & Units 08/03/2021    2:58 PM 02/02/2021    2:09 PM 01/18/2020   11:10 AM  CBC  WBC 4.0 - 10.5 K/uL 8.9  7.1  7.0   Hemoglobin 12.0 - 15.0 g/dL 11.7  12.0  12.7   Hematocrit 36.0 - 46.0 % 36.0  37.9  39.0   Platelets 150 - 400 K/uL 242  202  227        Latest Ref Rng & Units 08/03/2021    2:58 PM 02/02/2021    2:09 PM 01/18/2020   11:10 AM  CMP  Glucose 70 - 99 mg/dL 107  169  108   BUN 8 - 23 mg/dL 26  20  16   Creatinine 0.44 - 1.00 mg/dL 0.93  0.82  0.81   Sodium 135 - 145 mmol/L 142  140  141   Potassium 3.5 - 5.1 mmol/L 4.2  3.8  4.1   Chloride 98 - 111 mmol/L 105  104  106   CO2  22 - 32 mmol/L 30  28  27   Calcium 8.9 - 10.3 mg/dL 10.1  9.7  10.0   Total Protein 6.5 - 8.1 g/dL 7.2  7.0  7.6   Total Bilirubin 0.3 - 1.2 mg/dL 0.3  0.3  0.5   Alkaline Phos 38 - 126 U/L 51  62  60   AST 15 - 41 U/L 21  14  19   ALT 0 - 44 U/L 20  17  19       RADIOGRAPHIC STUDIES: No results found.  ASSESSMENT & PLAN Dana Nielsen 76 y.o. female with medical history significant for Dieulafoy lesion bleeding with iron deficiency anemia who presents for a follow up visit. After review the labs, discussion with the patient, and physical examination it appears that the patient has stable hemoglobin over the last several months. She has no evidence of GI bleeding and her iron stores appear replete at this time. Given these findings I think would be safe to follow-up with her in approximately 6 months time. As always we are happy to see the patient back sooner than those visits if she is feeling more symptomatic or has concerns for GI bleeding. Overall she is stable at this time.  #Iron Deficiency Anemia 2/2 to GI Bleeding --today will recheck iron panel, ferritin, reticulocyte panel, and CBC --patient is s/p IV iron in in   March 2021 --no evidence of bleeding on today's exam. Hgb is currently stably within the normal range --Hgb 11.7. Iron studies pending.  --plan for RTC in 6 months time. We can always see her sooner if she were to develop new symptoms or concern for dropping Hgb.   # Hypomagnesemia -- Patient has low levels of magnesium, unclear etiology.  May be secondary to diarrhea bouts --Patient takes 400 mg p.o. of magnesium daily.  Would recommend increasing this to twice daily --We will wait for iron levels to return, if necessary will provide IV magnesium therapy in addition to iron therapy.  #Jehovah's Witness --the patient is not to receive blood products under any circumstances, per her request. We have copied and uploaded her Blood Contract which she carries into her  wallet into our system. --in the event the patient is admitted with severe anemia or bleed please make the Hematology service aware so that we may provide bloodless options for support.    No orders of the defined types were placed in this encounter.  All questions were answered. The patient knows to call the clinic with any problems, questions or concerns.  A total of more than 30 minutes were spent on this encounter and over half of that time was spent on counseling and coordination of care as outlined above.   John T. Dorsey, MD Department of Hematology/Oncology [facility] Phone: [phone] Pager: [phone] Email: [email or web address]  08/03/2021 6:22 PM 

## 2021-08-04 LAB — FERRITIN: Ferritin: 214 ng/mL (ref 11–307)

## 2021-08-05 ENCOUNTER — Encounter: Payer: Self-pay | Admitting: Hematology and Oncology

## 2021-09-04 ENCOUNTER — Ambulatory Visit (HOSPITAL_COMMUNITY)
Admission: EM | Admit: 2021-09-04 | Discharge: 2021-09-04 | Disposition: A | Discharge status: Home / Self Care | Payer: Medicare HMO | Attending: Emergency Medicine | Admitting: Emergency Medicine

## 2021-09-04 ENCOUNTER — Encounter (HOSPITAL_COMMUNITY): Payer: Self-pay | Admitting: Emergency Medicine

## 2021-09-04 DIAGNOSIS — J0110 Acute frontal sinusitis, unspecified: Secondary | ICD-10-CM

## 2021-09-04 DIAGNOSIS — Z20822 Contact with and (suspected) exposure to covid-19: Secondary | ICD-10-CM | POA: Insufficient documentation

## 2021-09-04 DIAGNOSIS — R059 Cough, unspecified: Secondary | ICD-10-CM | POA: Insufficient documentation

## 2021-09-04 DIAGNOSIS — J029 Acute pharyngitis, unspecified: Secondary | ICD-10-CM | POA: Insufficient documentation

## 2021-09-04 LAB — SARS CORONAVIRUS 2 BY RT PCR: SARS Coronavirus 2 by RT PCR: NEGATIVE

## 2021-09-04 MED ORDER — AMOXICILLIN-POT CLAVULANATE 875-125 MG PO TABS: 1.0000 | ORAL_TABLET | Freq: Two times a day (BID) | ORAL | 0 refills | Status: DC

## 2021-09-04 NOTE — Discharge Instructions (Signed)
Stop the ampicillin. Take Augmentin instead as prescribed. We are sending out COVID test - the results should show up in your MyChart in the next day or two. Follow-up with PCP if no improvement after completing antibiotics.  Go to the ER if develop difficulty breathing.

## 2021-09-04 NOTE — ED Provider Notes (Signed)
[facility]    CSN: [id number] Arrival date & time: 09/04/21  1719      History   Chief Complaint Chief Complaint  Patient presents with   Headache   Sore Throat   Nasal Congestion    HPI Dana Nielsen is a 76 y.o. female.   Patient presents with concerns of feeling unwell for over a week. She reports headache, congestion, sinus pressure, and some sore throat and cough. She reports intermittent sweats and chills. She saw her PCP on Tuesday this week who gave her a steroid shot and prescribed Flonase for allergies but also prescribed oral ampicillin for her to take. She has not improved after this. The patient called her doctor again today and they advised she get a COVID test. She has not taken anything else for her symptoms. She denies known sick contacts.  The history is provided by the patient.  Headache Associated symptoms: congestion, cough, ear pain, fatigue, myalgias, sinus pressure and sore throat   Associated symptoms: no dizziness, no nausea and no vomiting   Sore Throat Associated symptoms include headaches. Pertinent negatives include no chest pain and no shortness of breath.    Past Medical History:  Diagnosis Date   Diabetes mellitus type 2, diet-controlled ([place])    Family history of colon cancer 12/09/2020   Family history of lung cancer 12/09/2020   Family history of multiple myeloma 12/09/2020   HLD (hyperlipidemia)    Hypothyroidism    Iron deficiency anemia     Patient Active Problem List   Diagnosis Date Noted   Family history of lung cancer 12/09/2020   Family history of colon cancer 12/09/2020   Family history of multiple myeloma 12/09/2020   Seasonal allergic rhinitis due to pollen 04/06/2019   Arterial hypotension 03/20/2019   Labyrinthitis 03/20/2019   Iron deficiency anemia due to chronic blood loss 03/09/2019   Dieulafoy lesion of duodenum    Gastric ulcer without hemorrhage or perforation    Gastritis and gastroduodenitis     Hiatal hernia    GI bleeding 03/07/2019   Blood loss anemia 03/07/2019   Melena 03/05/2019   Primary insomnia 12/14/2018   Patient is Jehovah's Witness 12/14/2018   Iron deficiency anemia 12/14/2018   Hypothyroidism 12/14/2018   Depression, major, single episode, mild ([place]) 12/14/2018   Controlled type 2 diabetes mellitus with complication, without long-term current use of insulin ([place]) 12/14/2018   Essential hypertension 12/14/2018   Vitamin D deficiency 12/14/2018   Elevated cholesterol 12/14/2018    Past Surgical History:  Procedure Laterality Date   arthroscopic knee     BIOPSY  03/08/2019   Procedure: BIOPSY;  Surgeon: Lavena Bullion, DO;  [facility];  Service: Gastroenterology;;   C sections     x 7   COLONOSCOPY  2019   x2 At the [place] and in [place]   ENTEROSCOPY N/A 03/08/2019   Procedure: ENTEROSCOPY;  Surgeon: Lavena Bullion, DO;  [facility];  Service: Gastroenterology;  Laterality: N/A;   HEMOSTASIS CLIP PLACEMENT  03/08/2019   Procedure: HEMOSTASIS CLIP PLACEMENT;  Surgeon: Lavena Bullion, DO;  [facility];  Service: Gastroenterology;;   SUBMUCOSAL TATTOO INJECTION  03/08/2019   Procedure: SUBMUCOSAL TATTOO INJECTION;  Surgeon: Lavena Bullion, DO;  [facility];  Service: Gastroenterology;;   TONSILLECTOMY     WISDOM TOOTH EXTRACTION     WRIST SURGERY      OB History   No obstetric history on file.  Home Medications    Prior to Admission medications   Medication Sig Start Date End Date Taking? Authorizing Provider  amoxicillin-clavulanate (AUGMENTIN) 875-125 MG tablet Take 1 tablet by mouth every 12 (twelve) hours. 09/04/21  Yes Abner Greenspan, Lachelle Rissler L, PA  ACCU-CHEK AVIVA PLUS test strip  03/09/19   [provider]  EQ ALLERGY RELIEF, CETIRIZINE, 10 MG tablet Take 1 tablet by mouth once daily 12/12/19   Dutch Quint B, FNP  furosemide (LASIX) 20 MG tablet Take 20 mg by mouth daily.     [provider]  losartan (COZAAR) 25 MG tablet Take 25 mg by mouth daily.    [provider]  magnesium oxide (MAG-OX) 400 (240 Mg) MG tablet Take 1 tablet (400 mg total) by mouth daily. 02/11/21   Orson Slick, MD  metFORMIN (GLUCOPHAGE) 1000 MG tablet Take 1,000 mg by mouth 2 (two) times daily with a meal.    [provider]  pantoprazole (PROTONIX) 40 MG tablet Take 1 tablet (40 mg total) by mouth 2 (two) times daily. 03/09/19 05/04/19  [place] Donnamarie Poag, DO  simvastatin (ZOCOR) 40 MG tablet Take 1 tablet (40 mg total) by mouth daily at 6 PM. 04/06/19   Libby Maw, MD  SYNTHROID 100 MCG tablet Take 100 mcg by mouth daily before breakfast.    [provider]  traZODone (DESYREL) 100 MG tablet TAKE 1 TABLET BY MOUTH AT BEDTIME 09/20/19   Libby Maw, MD  venlafaxine [facility]) 75 MG tablet Take 75 mg by mouth daily.     [provider]  vitamin B-12 (CYANOCOBALAMIN) 100 MCG tablet 1 tablet    [provider]    Family History Family History  Problem Relation Age of Onset   Multiple myeloma Mother 21   Heart attack Father    Lung cancer Sister 68       smoking hx   Leukemia Paternal Aunt        dx before 20   Cancer Paternal Aunt        unknown type; dx after 44   Colon cancer Paternal Aunt        dx after 62   Head & neck cancer Paternal Uncle        dx before 67   Cancer Paternal Uncle        unknown type; dx after 2   Cancer Paternal Uncle        color or gastric; dx after 23    Social History Social History   Tobacco Use   Smoking status: Never   Smokeless tobacco: Never  Vaping Use   Vaping Use: Never used  Substance Use Topics   Alcohol use: Not Currently   Drug use: Never     Allergies   Lidocaine and Nitrofurantoin   Review of Systems Review of Systems  Constitutional:  Positive for chills, diaphoresis and fatigue.  HENT:  Positive for congestion, ear pain, sinus pressure and sore  throat. Negative for rhinorrhea.   Respiratory:  Positive for cough. Negative for shortness of breath.   Cardiovascular:  Negative for chest pain.  Gastrointestinal:  Negative for nausea and vomiting.  Musculoskeletal:  Positive for myalgias.  Skin:  Negative for rash.  Neurological:  Positive for headaches. Negative for dizziness.     Physical Exam Triage Vital Signs ED Triage Vitals  Enc Vitals Group     BP 09/04/21 1831 117/66     Pulse Rate 09/04/21 1831 91  Resp 09/04/21 1831 20     Temp 09/04/21 1831 98.9 F (37.2 C)     Temp Source 09/04/21 1831 Oral     SpO2 09/04/21 1831 98 %     Weight --      Height --      Head Circumference --      Peak Flow --      Pain Score 09/04/21 1830 0     Pain Loc --      Pain Edu? --      Excl. in [place]? --    No data found.  Updated Vital Signs BP 117/66 (BP Location: Right Arm)   Pulse 91   Temp 98.9 F (37.2 C) (Oral)   Resp 20   SpO2 98%   Visual Acuity Right Eye Distance:   Left Eye Distance:   Bilateral Distance:    Right Eye Near:   Left Eye Near:    Bilateral Near:     Physical Exam Vitals and nursing note reviewed.  Constitutional:      General: She is not in acute distress. HENT:     Head: Normocephalic.     Right Ear: Tympanic membrane, ear canal and external ear normal.     Left Ear: Tympanic membrane, ear canal and external ear normal.     Nose: Congestion present. No rhinorrhea.     Right Sinus: Frontal sinus tenderness present.     Left Sinus: Frontal sinus tenderness (reports left worse than right) present.     Mouth/Throat:     Mouth: Mucous membranes are moist.     Pharynx: Oropharynx is clear.  Eyes:     Conjunctiva/sclera: Conjunctivae normal.     Pupils: Pupils are equal, round, and reactive to light.  Cardiovascular:     Rate and Rhythm: Normal rate and regular rhythm.     Heart sounds: Normal heart sounds.  Pulmonary:     Effort: Pulmonary effort is normal.     Breath sounds: Normal  breath sounds.  Musculoskeletal:     Cervical back: Normal range of motion.  Lymphadenopathy:     Cervical: No cervical adenopathy.  Skin:    Findings: No rash.  Neurological:     Mental Status: She is alert.     Gait: Gait normal.  Psychiatric:        Mood and Affect: Mood normal.      UC Treatments / Results  Labs (all labs ordered are listed, but only abnormal results are displayed) Labs Reviewed  SARS CORONAVIRUS 2 BY RT PCR    EKG   Radiology No results found.  Procedures Procedures (including critical care time)  Medications Ordered in UC Medications - No data to display  Initial Impression / Assessment and Plan / UC Course  I have reviewed the triage vital signs and the nursing notes.  Pertinent labs & imaging results that were available during my care of the patient were reviewed by me and considered in my medical decision making (see chart for details).     Sx more consistent with sinusitis - unlikely to be covered with ampicillin due to high resistance rates. Empiric Augmentin instead. Will also send out COVID test as pt required. Discussed return and ER precautions.   Final Clinical Impressions(s) / UC Diagnoses   Final diagnoses:  Acute non-recurrent frontal sinusitis     Discharge Instructions      Stop the ampicillin. Take Augmentin instead as prescribed. We are sending out COVID test -  the results should show up in your MyChart in the next day or two. Follow-up with PCP if no improvement after completing antibiotics.  Go to the ER if develop difficulty breathing.     ED Prescriptions     Medication Sig Dispense Auth. Provider   amoxicillin-clavulanate (AUGMENTIN) 875-125 MG tablet Take 1 tablet by mouth every 12 (twelve) hours. 14 tablet Abner Greenspan, Brigitta Pricer L, [place]      PDMP not reviewed this encounter.   Delsa Sale, [place] 09/04/21 1916

## 2021-09-04 NOTE — ED Triage Notes (Signed)
For over week having congestion, headache, fatigue, sore throat, and low grade fevers. Saw doctor Tuesday and was given steroid shot. Doctor called today to see how patient was feeling but not any better, so advised to get Covid test.

## 2021-09-08 ENCOUNTER — Encounter: Payer: Self-pay | Admitting: Podiatry

## 2021-09-10 ENCOUNTER — Other Ambulatory Visit: Payer: Self-pay

## 2021-09-10 ENCOUNTER — Inpatient Hospital Stay: Payer: Medicare HMO | Attending: Hematology and Oncology

## 2021-09-10 DIAGNOSIS — D500 Iron deficiency anemia secondary to blood loss (chronic): Secondary | ICD-10-CM | POA: Diagnosis present

## 2021-09-10 LAB — CMP (CANCER CENTER ONLY)
ALT: 21 U/L (ref 0–44)
AST: 18 U/L (ref 15–41)
Albumin: 4.3 g/dL (ref 3.5–5.0)
Alkaline Phosphatase: 51 U/L (ref 38–126)
Anion gap: 7 (ref 5–15)
BUN: 20 mg/dL (ref 8–23)
CO2: 28 mmol/L (ref 22–32)
Calcium: 9.9 mg/dL (ref 8.9–10.3)
Chloride: 105 mmol/L (ref 98–111)
Creatinine: 0.87 mg/dL (ref 0.44–1.00)
GFR, Estimated: >60 mL/min (ref >60)
Glucose, Bld: 165 mg/dL — ABNORMAL HIGH (ref 70–99)
Potassium: 4.2 mmol/L (ref 3.5–5.1)
Sodium: 140 mmol/L (ref 135–145)
Total Bilirubin: 0.5 mg/dL (ref 0.3–1.2)
Total Protein: 7.1 g/dL (ref 6.5–8.1)

## 2021-09-10 LAB — CBC WITH DIFFERENTIAL (CANCER CENTER ONLY)
Abs Immature Granulocytes: 0.04 10*3/uL (ref 0.00–0.07)
Basophils Absolute: 0.1 10*3/uL (ref 0.0–0.1)
Basophils Relative: 1 %
Eosinophils Absolute: 0.2 10*3/uL (ref 0.0–0.5)
Eosinophils Relative: 3 %
HCT: 33.7 % — ABNORMAL LOW (ref 36.0–46.0)
Hemoglobin: 11 g/dL — ABNORMAL LOW (ref 12.0–15.0)
Immature Granulocytes: 1 %
Lymphocytes Relative: 26 %
Lymphs Abs: 2 10*3/uL (ref 0.7–4.0)
MCH: 29.1 pg (ref 26.0–34.0)
MCHC: 32.6 g/dL (ref 30.0–36.0)
MCV: 89.2 fL (ref 80.0–100.0)
Monocytes Absolute: 0.6 10*3/uL (ref 0.1–1.0)
Monocytes Relative: 8 %
Neutro Abs: 4.7 10*3/uL (ref 1.7–7.7)
Neutrophils Relative %: 61 %
Platelet Count: 220 10*3/uL (ref 150–400)
RBC: 3.78 MIL/uL — ABNORMAL LOW (ref 3.87–5.11)
RDW: 14.6 % (ref 11.5–15.5)
WBC Count: 7.6 10*3/uL (ref 4.0–10.5)
nRBC: 0 % (ref 0.0–0.2)

## 2021-09-10 LAB — RETIC PANEL
Immature Retic Fract: 15.5 % (ref 2.3–15.9)
RBC.: 3.78 MIL/uL — ABNORMAL LOW (ref 3.87–5.11)
Retic Count, Absolute: 87.7 10*3/uL (ref 19.0–186.0)
Retic Ct Pct: 2.3 % (ref 0.4–3.1)
Reticulocyte Hemoglobin: 30.7 pg (ref >27.9)

## 2021-09-10 LAB — IRON AND IRON BINDING CAPACITY (CC-WL,HP ONLY)
Iron: 49 ug/dL (ref 28–170)
Saturation Ratios: 13 % (ref 10.4–31.8)
TIBC: 382 ug/dL (ref 250–450)
UIBC: 333 ug/dL (ref 148–442)

## 2021-09-10 LAB — MAGNESIUM: Magnesium: 1.3 mg/dL — ABNORMAL LOW (ref 1.7–2.4)

## 2021-09-11 ENCOUNTER — Other Ambulatory Visit: Payer: Medicare HMO

## 2021-09-11 ENCOUNTER — Inpatient Hospital Stay: Payer: Medicare HMO

## 2021-09-11 ENCOUNTER — Encounter: Payer: Self-pay | Admitting: Hematology and Oncology

## 2021-09-11 ENCOUNTER — Ambulatory Visit: Payer: Medicare HMO | Admitting: Hematology and Oncology

## 2021-09-11 LAB — FERRITIN: Ferritin: 173 ng/mL (ref 11–307)

## 2021-09-18 ENCOUNTER — Inpatient Hospital Stay: Payer: Medicare HMO | Attending: Hematology and Oncology

## 2021-09-18 ENCOUNTER — Telehealth: Payer: Self-pay | Admitting: *Deleted

## 2021-09-18 ENCOUNTER — Other Ambulatory Visit: Payer: Self-pay | Admitting: Hematology and Oncology

## 2021-09-18 ENCOUNTER — Encounter (HOSPITAL_COMMUNITY): Payer: Self-pay

## 2021-09-18 ENCOUNTER — Other Ambulatory Visit: Payer: Self-pay

## 2021-09-18 ENCOUNTER — Inpatient Hospital Stay (HOSPITAL_COMMUNITY)
Admission: EM | Admit: 2021-09-18 | Discharge: 2021-09-21 | DRG: 379 | Disposition: A | Discharge status: Home / Self Care | Payer: Medicare HMO | Attending: Internal Medicine | Admitting: Internal Medicine

## 2021-09-18 DIAGNOSIS — E039 Hypothyroidism, unspecified: Secondary | ICD-10-CM | POA: Diagnosis present

## 2021-09-18 DIAGNOSIS — K449 Diaphragmatic hernia without obstruction or gangrene: Secondary | ICD-10-CM | POA: Diagnosis present

## 2021-09-18 DIAGNOSIS — E669 Obesity, unspecified: Secondary | ICD-10-CM | POA: Diagnosis present

## 2021-09-18 DIAGNOSIS — Z79899 Other long term (current) drug therapy: Secondary | ICD-10-CM

## 2021-09-18 DIAGNOSIS — Z884 Allergy status to anesthetic agent status: Secondary | ICD-10-CM

## 2021-09-18 DIAGNOSIS — Z806 Family history of leukemia: Secondary | ICD-10-CM | POA: Insufficient documentation

## 2021-09-18 DIAGNOSIS — D509 Iron deficiency anemia, unspecified: Secondary | ICD-10-CM | POA: Diagnosis present

## 2021-09-18 DIAGNOSIS — Z6835 Body mass index (BMI) 35.0-35.9, adult: Secondary | ICD-10-CM

## 2021-09-18 DIAGNOSIS — K219 Gastro-esophageal reflux disease without esophagitis: Secondary | ICD-10-CM | POA: Diagnosis present

## 2021-09-18 DIAGNOSIS — K5909 Other constipation: Secondary | ICD-10-CM | POA: Insufficient documentation

## 2021-09-18 DIAGNOSIS — D500 Iron deficiency anemia secondary to blood loss (chronic): Secondary | ICD-10-CM

## 2021-09-18 DIAGNOSIS — K3182 Dieulafoy lesion (hemorrhagic) of stomach and duodenum: Principal | ICD-10-CM | POA: Diagnosis present

## 2021-09-18 DIAGNOSIS — Z8249 Family history of ischemic heart disease and other diseases of the circulatory system: Secondary | ICD-10-CM

## 2021-09-18 DIAGNOSIS — D649 Anemia, unspecified: Secondary | ICD-10-CM

## 2021-09-18 DIAGNOSIS — E118 Type 2 diabetes mellitus with unspecified complications: Secondary | ICD-10-CM | POA: Diagnosis present

## 2021-09-18 DIAGNOSIS — M79602 Pain in left arm: Secondary | ICD-10-CM | POA: Insufficient documentation

## 2021-09-18 DIAGNOSIS — Z807 Family history of other malignant neoplasms of lymphoid, hematopoietic and related tissues: Secondary | ICD-10-CM

## 2021-09-18 DIAGNOSIS — Z883 Allergy status to other anti-infective agents status: Secondary | ICD-10-CM

## 2021-09-18 DIAGNOSIS — Z808 Family history of malignant neoplasm of other organs or systems: Secondary | ICD-10-CM

## 2021-09-18 DIAGNOSIS — E119 Type 2 diabetes mellitus without complications: Secondary | ICD-10-CM | POA: Insufficient documentation

## 2021-09-18 DIAGNOSIS — Z7984 Long term (current) use of oral hypoglycemic drugs: Secondary | ICD-10-CM

## 2021-09-18 DIAGNOSIS — Z888 Allergy status to other drugs, medicaments and biological substances status: Secondary | ICD-10-CM

## 2021-09-18 DIAGNOSIS — Z79890 Hormone replacement therapy (postmenopausal): Secondary | ICD-10-CM

## 2021-09-18 DIAGNOSIS — R195 Other fecal abnormalities: Secondary | ICD-10-CM | POA: Diagnosis present

## 2021-09-18 DIAGNOSIS — K922 Gastrointestinal hemorrhage, unspecified: Principal | ICD-10-CM

## 2021-09-18 DIAGNOSIS — Z800 Family history of malignant neoplasm of digestive organs: Secondary | ICD-10-CM

## 2021-09-18 DIAGNOSIS — Z531 Procedure and treatment not carried out because of patient's decision for reasons of belief and group pressure: Secondary | ICD-10-CM | POA: Diagnosis present

## 2021-09-18 DIAGNOSIS — E785 Hyperlipidemia, unspecified: Secondary | ICD-10-CM | POA: Diagnosis present

## 2021-09-18 DIAGNOSIS — I10 Essential (primary) hypertension: Secondary | ICD-10-CM | POA: Diagnosis present

## 2021-09-18 DIAGNOSIS — Z801 Family history of malignant neoplasm of trachea, bronchus and lung: Secondary | ICD-10-CM | POA: Insufficient documentation

## 2021-09-18 DIAGNOSIS — K317 Polyp of stomach and duodenum: Secondary | ICD-10-CM | POA: Diagnosis present

## 2021-09-18 LAB — CBC
HCT: 32.9 % — ABNORMAL LOW (ref 36.0–46.0)
Hemoglobin: 10.4 g/dL — ABNORMAL LOW (ref 12.0–15.0)
MCH: 28.7 pg (ref 26.0–34.0)
MCHC: 31.6 g/dL (ref 30.0–36.0)
MCV: 90.6 fL (ref 80.0–100.0)
Platelets: 289 10*3/uL (ref 150–400)
RBC: 3.63 MIL/uL — ABNORMAL LOW (ref 3.87–5.11)
RDW: 14.5 % (ref 11.5–15.5)
WBC: 9.7 10*3/uL (ref 4.0–10.5)
nRBC: 0 % (ref 0.0–0.2)

## 2021-09-18 LAB — CMP (CANCER CENTER ONLY)
ALT: 17 U/L (ref 0–44)
AST: 17 U/L (ref 15–41)
Albumin: 4.6 g/dL (ref 3.5–5.0)
Alkaline Phosphatase: 52 U/L (ref 38–126)
Anion gap: 10 (ref 5–15)
BUN: 23 mg/dL (ref 8–23)
CO2: 28 mmol/L (ref 22–32)
Calcium: 10.1 mg/dL (ref 8.9–10.3)
Chloride: 100 mmol/L (ref 98–111)
Creatinine: 1.02 mg/dL — ABNORMAL HIGH (ref 0.44–1.00)
GFR, Estimated: 57 mL/min — ABNORMAL LOW (ref >60)
Glucose, Bld: 191 mg/dL — ABNORMAL HIGH (ref 70–99)
Potassium: 4 mmol/L (ref 3.5–5.1)
Sodium: 138 mmol/L (ref 135–145)
Total Bilirubin: 0.4 mg/dL (ref 0.3–1.2)
Total Protein: 7.6 g/dL (ref 6.5–8.1)

## 2021-09-18 LAB — PROTIME-INR
INR: 1 (ref 0.8–1.2)
Prothrombin Time: 13.4 seconds (ref 11.4–15.2)

## 2021-09-18 LAB — CBC WITH DIFFERENTIAL (CANCER CENTER ONLY)
Abs Immature Granulocytes: 0.04 10*3/uL (ref 0.00–0.07)
Basophils Absolute: 0.1 10*3/uL (ref 0.0–0.1)
Basophils Relative: 1 %
Eosinophils Absolute: 0.2 10*3/uL (ref 0.0–0.5)
Eosinophils Relative: 2 %
HCT: 31.9 % — ABNORMAL LOW (ref 36.0–46.0)
Hemoglobin: 10.3 g/dL — ABNORMAL LOW (ref 12.0–15.0)
Immature Granulocytes: 1 %
Lymphocytes Relative: 25 %
Lymphs Abs: 2.1 10*3/uL (ref 0.7–4.0)
MCH: 29.1 pg (ref 26.0–34.0)
MCHC: 32.3 g/dL (ref 30.0–36.0)
MCV: 90.1 fL (ref 80.0–100.0)
Monocytes Absolute: 0.6 10*3/uL (ref 0.1–1.0)
Monocytes Relative: 8 %
Neutro Abs: 5.4 10*3/uL (ref 1.7–7.7)
Neutrophils Relative %: 63 %
Platelet Count: 268 10*3/uL (ref 150–400)
RBC: 3.54 MIL/uL — ABNORMAL LOW (ref 3.87–5.11)
RDW: 14.5 % (ref 11.5–15.5)
WBC Count: 8.4 10*3/uL (ref 4.0–10.5)
nRBC: 0 % (ref 0.0–0.2)

## 2021-09-18 LAB — BASIC METABOLIC PANEL
Anion gap: 13 (ref 5–15)
BUN: 30 mg/dL — ABNORMAL HIGH (ref 8–23)
CO2: 23 mmol/L (ref 22–32)
Calcium: 10.4 mg/dL — ABNORMAL HIGH (ref 8.9–10.3)
Chloride: 103 mmol/L (ref 98–111)
Creatinine, Ser: 1.09 mg/dL — ABNORMAL HIGH (ref 0.44–1.00)
GFR, Estimated: 53 mL/min — ABNORMAL LOW (ref >60)
Glucose, Bld: 120 mg/dL — ABNORMAL HIGH (ref 70–99)
Potassium: 4.1 mmol/L (ref 3.5–5.1)
Sodium: 139 mmol/L (ref 135–145)

## 2021-09-18 LAB — TYPE AND SCREEN
ABO/RH(D): A POS
Antibody Screen: NEGATIVE

## 2021-09-18 LAB — RETIC PANEL
Immature Retic Fract: 29.6 % — ABNORMAL HIGH (ref 2.3–15.9)
RBC.: 3.64 MIL/uL — ABNORMAL LOW (ref 3.87–5.11)
Retic Count, Absolute: 108.1 10*3/uL (ref 19.0–186.0)
Retic Ct Pct: 3 % (ref 0.4–3.1)
Reticulocyte Hemoglobin: 29.1 pg (ref >27.9)

## 2021-09-18 LAB — IRON AND IRON BINDING CAPACITY (CC-WL,HP ONLY)
Iron: 31 ug/dL (ref 28–170)
Saturation Ratios: 8 % — ABNORMAL LOW (ref 10.4–31.8)
TIBC: 399 ug/dL (ref 250–450)
UIBC: 368 ug/dL (ref 148–442)

## 2021-09-18 LAB — FERRITIN: Ferritin: 181 ng/mL (ref 11–307)

## 2021-09-18 NOTE — Telephone Encounter (Signed)
Received vm message from pt. She had her labs done today and noted that her HGB has gone down a bit to 10.3. It was 11. Also noted a decrease in her iron levels, awaiting Ferritin level. TCT patient. Spoke with her and she is very fatigued and light headed. She states her last Mg level was 1.3 (09/10/21) She is asking what will Dr. Lorenso Courier recommend. She has had IV iron in the past. She is not on oral iron.  She does take oral Magnesium BID.  She has not seen any blood in her stool.  Please advise

## 2021-09-18 NOTE — ED Triage Notes (Signed)
Pt states that her blood is slowly going down. Pt reports having a dark stool today. She states that her doctor told her to come to the emergency room.

## 2021-09-19 ENCOUNTER — Encounter (HOSPITAL_COMMUNITY)
Admission: EM | Disposition: A | Discharge status: Home / Self Care | Payer: Self-pay | Source: Home / Self Care | Attending: Internal Medicine

## 2021-09-19 ENCOUNTER — Encounter (HOSPITAL_COMMUNITY): Payer: Self-pay | Admitting: Family Medicine

## 2021-09-19 ENCOUNTER — Observation Stay (HOSPITAL_COMMUNITY): Payer: Medicare HMO | Admitting: Certified Registered Nurse Anesthetist

## 2021-09-19 DIAGNOSIS — Z79899 Other long term (current) drug therapy: Secondary | ICD-10-CM | POA: Diagnosis not present

## 2021-09-19 DIAGNOSIS — E039 Hypothyroidism, unspecified: Secondary | ICD-10-CM

## 2021-09-19 DIAGNOSIS — Z531 Procedure and treatment not carried out because of patient's decision for reasons of belief and group pressure: Secondary | ICD-10-CM

## 2021-09-19 DIAGNOSIS — K219 Gastro-esophageal reflux disease without esophagitis: Secondary | ICD-10-CM | POA: Diagnosis present

## 2021-09-19 DIAGNOSIS — Z79890 Hormone replacement therapy (postmenopausal): Secondary | ICD-10-CM | POA: Diagnosis not present

## 2021-09-19 DIAGNOSIS — D638 Anemia in other chronic diseases classified elsewhere: Secondary | ICD-10-CM | POA: Diagnosis not present

## 2021-09-19 DIAGNOSIS — Z800 Family history of malignant neoplasm of digestive organs: Secondary | ICD-10-CM | POA: Diagnosis not present

## 2021-09-19 DIAGNOSIS — K317 Polyp of stomach and duodenum: Secondary | ICD-10-CM | POA: Diagnosis present

## 2021-09-19 DIAGNOSIS — Z883 Allergy status to other anti-infective agents status: Secondary | ICD-10-CM | POA: Diagnosis not present

## 2021-09-19 DIAGNOSIS — K449 Diaphragmatic hernia without obstruction or gangrene: Secondary | ICD-10-CM | POA: Diagnosis not present

## 2021-09-19 DIAGNOSIS — I10 Essential (primary) hypertension: Secondary | ICD-10-CM

## 2021-09-19 DIAGNOSIS — Z6835 Body mass index (BMI) 35.0-35.9, adult: Secondary | ICD-10-CM | POA: Diagnosis not present

## 2021-09-19 DIAGNOSIS — R195 Other fecal abnormalities: Secondary | ICD-10-CM

## 2021-09-19 DIAGNOSIS — Z7984 Long term (current) use of oral hypoglycemic drugs: Secondary | ICD-10-CM | POA: Diagnosis not present

## 2021-09-19 DIAGNOSIS — E119 Type 2 diabetes mellitus without complications: Secondary | ICD-10-CM | POA: Diagnosis present

## 2021-09-19 DIAGNOSIS — Z808 Family history of malignant neoplasm of other organs or systems: Secondary | ICD-10-CM | POA: Diagnosis not present

## 2021-09-19 DIAGNOSIS — Z8249 Family history of ischemic heart disease and other diseases of the circulatory system: Secondary | ICD-10-CM | POA: Diagnosis not present

## 2021-09-19 DIAGNOSIS — Z807 Family history of other malignant neoplasms of lymphoid, hematopoietic and related tissues: Secondary | ICD-10-CM | POA: Diagnosis not present

## 2021-09-19 DIAGNOSIS — Z888 Allergy status to other drugs, medicaments and biological substances status: Secondary | ICD-10-CM | POA: Diagnosis not present

## 2021-09-19 DIAGNOSIS — D509 Iron deficiency anemia, unspecified: Secondary | ICD-10-CM | POA: Diagnosis present

## 2021-09-19 DIAGNOSIS — Z801 Family history of malignant neoplasm of trachea, bronchus and lung: Secondary | ICD-10-CM | POA: Diagnosis not present

## 2021-09-19 DIAGNOSIS — E118 Type 2 diabetes mellitus with unspecified complications: Secondary | ICD-10-CM | POA: Diagnosis not present

## 2021-09-19 DIAGNOSIS — K31819 Angiodysplasia of stomach and duodenum without bleeding: Secondary | ICD-10-CM | POA: Diagnosis not present

## 2021-09-19 DIAGNOSIS — K922 Gastrointestinal hemorrhage, unspecified: Secondary | ICD-10-CM | POA: Diagnosis present

## 2021-09-19 DIAGNOSIS — D649 Anemia, unspecified: Secondary | ICD-10-CM

## 2021-09-19 DIAGNOSIS — E785 Hyperlipidemia, unspecified: Secondary | ICD-10-CM | POA: Diagnosis present

## 2021-09-19 DIAGNOSIS — K3182 Dieulafoy lesion (hemorrhagic) of stomach and duodenum: Secondary | ICD-10-CM | POA: Diagnosis present

## 2021-09-19 DIAGNOSIS — Z884 Allergy status to anesthetic agent status: Secondary | ICD-10-CM | POA: Diagnosis not present

## 2021-09-19 DIAGNOSIS — E669 Obesity, unspecified: Secondary | ICD-10-CM | POA: Diagnosis present

## 2021-09-19 HISTORY — PX: ENTEROSCOPY: SHX5533

## 2021-09-19 HISTORY — PX: HEMOSTASIS CLIP PLACEMENT: SHX6857

## 2021-09-19 HISTORY — PX: HOT HEMOSTASIS: SHX5433

## 2021-09-19 HISTORY — PX: SUBMUCOSAL TATTOO INJECTION: SHX6856

## 2021-09-19 LAB — HEMOGLOBIN A1C
Hgb A1c MFr Bld: 6.6 % — ABNORMAL HIGH (ref 4.8–5.6)
Mean Plasma Glucose: 142.72 mg/dL

## 2021-09-19 LAB — BASIC METABOLIC PANEL
Anion gap: 10 (ref 5–15)
BUN: 30 mg/dL — ABNORMAL HIGH (ref 8–23)
CO2: 27 mmol/L (ref 22–32)
Calcium: 9.5 mg/dL (ref 8.9–10.3)
Chloride: 101 mmol/L (ref 98–111)
Creatinine, Ser: 0.92 mg/dL (ref 0.44–1.00)
GFR, Estimated: >60 mL/min (ref >60)
Glucose, Bld: 111 mg/dL — ABNORMAL HIGH (ref 70–99)
Potassium: 3.8 mmol/L (ref 3.5–5.1)
Sodium: 138 mmol/L (ref 135–145)

## 2021-09-19 LAB — CBG MONITORING, ED
Glucose-Capillary: 124 mg/dL — ABNORMAL HIGH (ref 70–99)
Glucose-Capillary: 125 mg/dL — ABNORMAL HIGH (ref 70–99)
Glucose-Capillary: 117 mg/dL — ABNORMAL HIGH (ref 70–99)

## 2021-09-19 LAB — GLUCOSE, CAPILLARY
Glucose-Capillary: 155 mg/dL — ABNORMAL HIGH (ref 70–99)
Glucose-Capillary: 166 mg/dL — ABNORMAL HIGH (ref 70–99)

## 2021-09-19 LAB — POC OCCULT BLOOD, ED: Fecal Occult Bld: POSITIVE — AB

## 2021-09-19 LAB — HEMATOCRIT: HCT: 29.5 % — ABNORMAL LOW (ref 36.0–46.0)

## 2021-09-19 LAB — MAGNESIUM: Magnesium: 1.3 mg/dL — ABNORMAL LOW (ref 1.7–2.4)

## 2021-09-19 LAB — HEMOGLOBIN AND HEMATOCRIT, BLOOD
HCT: 29.2 % — ABNORMAL LOW (ref 36.0–46.0)
Hemoglobin: 9.1 g/dL — ABNORMAL LOW (ref 12.0–15.0)

## 2021-09-19 LAB — HEMOGLOBIN: Hemoglobin: 9.6 g/dL — ABNORMAL LOW (ref 12.0–15.0)

## 2021-09-19 SURGERY — ENTEROSCOPY
Anesthesia: Monitor Anesthesia Care

## 2021-09-19 MED ORDER — SODIUM CHLORIDE 0.9% FLUSH
3.0000 mL | Freq: Two times a day (BID) | INTRAVENOUS | Status: DC
  Administered 2021-09-19 – 2021-09-21 (×4): 3 mL via INTRAVENOUS

## 2021-09-19 MED ORDER — SPOT INK MARKER SYRINGE KIT
PACK | SUBMUCOSAL | Status: DC | PRN
  Administered 2021-09-19: 4 mL via SUBMUCOSAL

## 2021-09-19 MED ORDER — LACTATED RINGERS IV SOLN: INTRAVENOUS | Status: DC | PRN

## 2021-09-19 MED ORDER — PHENYLEPHRINE 80 MCG/ML (10ML) SYRINGE FOR IV PUSH (FOR BLOOD PRESSURE SUPPORT)
PREFILLED_SYRINGE | INTRAVENOUS | Status: DC | PRN
  Administered 2021-09-19 (×3): 80 ug via INTRAVENOUS

## 2021-09-19 MED ORDER — INSULIN ASPART 100 UNIT/ML IJ SOLN
0.0000 [IU] | INTRAMUSCULAR | Status: DC
  Administered 2021-09-19: 1 [IU] via SUBCUTANEOUS
  Administered 2021-09-20: 2 [IU] via SUBCUTANEOUS
  Administered 2021-09-20: 1 [IU] via SUBCUTANEOUS
  Filled 2021-09-19: qty 0.06

## 2021-09-19 MED ORDER — PROPOFOL 500 MG/50ML IV EMUL
INTRAVENOUS | Status: DC | PRN
  Administered 2021-09-19: 50 mg via INTRAVENOUS
  Administered 2021-09-19: 100 ug/kg/min via INTRAVENOUS

## 2021-09-19 MED ORDER — ONDANSETRON HCL 4 MG/2ML IJ SOLN: 4.0000 mg | Freq: Four times a day (QID) | INTRAMUSCULAR | Status: DC | PRN

## 2021-09-19 MED ORDER — EPINEPHRINE 1 MG/10ML IJ SOSY
PREFILLED_SYRINGE | INTRAMUSCULAR | Status: AC
  Filled 2021-09-19: qty 10

## 2021-09-19 MED ORDER — SPOT INK MARKER SYRINGE KIT
PACK | SUBMUCOSAL | Status: AC
  Filled 2021-09-19: qty 5

## 2021-09-19 MED ORDER — PANTOPRAZOLE SODIUM 40 MG IV SOLR
40.0000 mg | Freq: Two times a day (BID) | INTRAVENOUS | Status: DC
Start: 2021-09-19 — End: 2021-09-21
  Administered 2021-09-19 – 2021-09-21 (×4): 40 mg via INTRAVENOUS
  Filled 2021-09-19 (×4): qty 10

## 2021-09-19 MED ORDER — ACETAMINOPHEN 325 MG PO TABS: 650.0000 mg | ORAL_TABLET | Freq: Four times a day (QID) | ORAL | Status: DC | PRN

## 2021-09-19 MED ORDER — TRAZODONE HCL 100 MG PO TABS
100.0000 mg | ORAL_TABLET | Freq: Every evening | ORAL | Status: DC | PRN
Start: 2021-09-19 — End: 2021-09-21
  Administered 2021-09-19 – 2021-09-21 (×2): 100 mg via ORAL
  Filled 2021-09-19 (×2): qty 1

## 2021-09-19 MED ORDER — SODIUM CHLORIDE 0.9 % IV BOLUS
500.0000 mL | Freq: Once | INTRAVENOUS | Status: AC
  Administered 2021-09-19: 500 mL via INTRAVENOUS

## 2021-09-19 MED ORDER — ACETAMINOPHEN 650 MG RE SUPP: 650.0000 mg | Freq: Four times a day (QID) | RECTAL | Status: DC | PRN

## 2021-09-19 NOTE — Consult Note (Signed)
Consultation  Referring Provider:     Mitzi Hansen Primary Care Physician:  Orson Slick, MD Primary Gastroenterologist:      Dr. Bryan Lemma [facility])   Reason for Consultation:     GI bleeding         HPI:   Dana Nielsen is a 76 y.o. 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  She has a history of obscure GI bleeding in 2019 with a work-up at an outside hospital to include EGD, colonoscopy, capsule endoscopy (no records available) of unclear cause.  She was admitted to our hospital in February 2021 with recurrent GI bleeding.  She was found to have a gastric ulcer at that time and also had a duodenal Dula Foy lesion.  She had been placed on PPI, received endoscopic therapy, and did well.  She had been on Protonix daily for reflux and history of PUD at baseline.  She was seen by Dr. Bryan Lemma back in May for nausea and to assess for mucosal healing of the ulcer.  She had an EGD on May 5 of this year which showed healing of the gastric ulcer.  She had some benign fundic gland polyps removed from her stomach, and her duodenum was normal.  She states a few months ago she was seen by a new primary care physician who changed her medications.  She states her Protonix was stopped and she has been off all PPIs for a few months.  She states she has been "chewing Tums" for treatment of her reflux lately.  She has not been on any PPIs.  She denies any NSAID use.  She states she has had some intermittent dark stools over time and overnight had some clear melena that brought her to the hospital.  She denied any bright red blood per rectum.  No abdominal pains.  No nausea or vomiting.  She denies any shortness of breath or chest pain.  She last ate at 5 PM  yesterday.  On arrival to the hospital last night her hemoglobin was 10.3, down from baseline of 11.7 back in July, and 11.0 on 8/31.  BUN elevated to 30 with a normal creatinine.  Hemoglobin this morning drifted to 9.6.  Blood pressure is currently normal and heart rate in the 80s in the ED.  She is otherwise well appearing and denies any chest pains, shortness of breath lightheadedness etc.  She reports a lot of frustration since being taken off of her Protonix and blames that for her recent reflux and perhaps this bleeding issue.  She denies any significant cardiac comorbidities.  She has tolerated anesthesia well in the past.  Again when speaking with her about the possibility for blood products or transfusions, she adamantly declines.    Past Medical History:  Diagnosis Date   Diabetes mellitus type 2, diet-controlled ([place])    Family history of colon cancer 12/09/2020   Family history of lung cancer 12/09/2020   Family history of multiple myeloma 12/09/2020   HLD (hyperlipidemia)    Hypothyroidism    Iron deficiency anemia     Past Surgical History:  Procedure Laterality Date   arthroscopic knee     BIOPSY  03/08/2019   Procedure: BIOPSY;  Surgeon: Lavena Bullion, DO;  Location: [facility];  Service: Gastroenterology;;   C sections     x 7   COLONOSCOPY  2019   x2 At the [facility] and in [place]   ENTEROSCOPY N/A 03/08/2019   Procedure: ENTEROSCOPY;  Surgeon: Lavena Bullion, DO;  Location: [facility];  Service: Gastroenterology;  Laterality: N/A;   HEMOSTASIS CLIP PLACEMENT  03/08/2019   Procedure: HEMOSTASIS CLIP PLACEMENT;  Surgeon: Lavena Bullion, DO;  Location: [facility];  Service: Gastroenterology;;   SUBMUCOSAL TATTOO INJECTION  03/08/2019   Procedure: SUBMUCOSAL TATTOO INJECTION;  Surgeon: Lavena Bullion, DO;  Location: [facility];  Service: Gastroenterology;;   TONSILLECTOMY     WISDOM TOOTH EXTRACTION     WRIST SURGERY      Family History   Problem Relation Age of Onset   Multiple myeloma Mother 56   Heart attack Father    Lung cancer Sister 63       smoking hx   Leukemia Paternal Aunt        dx before 83   Cancer Paternal Aunt        unknown type; dx after 50   Colon cancer Paternal Aunt        dx after 24   Head & neck cancer Paternal Uncle        dx before 47   Cancer Paternal Uncle        unknown type; dx after 54   Cancer Paternal Uncle        color or gastric; dx after 11     Social History   Tobacco Use   Smoking status: Never   Smokeless tobacco: Never  Vaping Use   Vaping Use: Never used  Substance Use Topics   Alcohol use: Not Currently   Drug use: Never    Prior to Admission medications   Medication Sig Start Date End Date Taking? Authorizing Provider  cetirizine (ZYRTEC) 10 MG tablet Take 10 mg by mouth at bedtime.   Yes [provider]  furosemide (LASIX) 20 MG tablet Take 20 mg by mouth in the morning.   Yes [provider]  losartan (COZAAR) 25 MG tablet Take 25 mg by mouth in the morning.   Yes [provider]  magnesium oxide (MAG-OX) 400 (240 Mg) MG tablet Take 1 tablet (400 mg total) by mouth daily. Patient taking differently: Take 400 mg by mouth in the morning. 02/11/21  Yes Orson Slick, MD  metFORMIN (GLUCOPHAGE) 1000 MG tablet Take 1,000 mg by mouth 2 (two) times daily with a meal.   Yes [provider]  simvastatin (ZOCOR) 40 MG tablet Take 1 tablet (40 mg total) by mouth daily at 6 PM. 04/06/19  Yes Libby Maw, MD  SYNTHROID 100 MCG tablet Take 100 mcg by mouth daily before breakfast.   Yes [provider]  traZODone (DESYREL) 100 MG tablet TAKE 1 TABLET BY MOUTH AT BEDTIME Patient taking differently: Take 100 mg by mouth at bedtime. 09/20/19  Yes Libby Maw, MD  triamcinolone cream (KENALOG) 0.1 % Apply 1 Application topically daily.   Yes [provider]  VICTOZA 18 MG/3ML SOPN Inject 1.2 mg into the  skin in the morning. 09/10/21  Yes [provider]  amoxicillin-clavulanate (AUGMENTIN) 875-125 MG tablet Take 1 tablet by mouth every 12 (twelve) hours. 09/04/21   Abner Greenspan, Amy L, PA  omeprazole (PRILOSEC) 40 MG capsule Take 40 mg by mouth in the morning. 09/16/21   [provider]  pantoprazole (PROTONIX) 40 MG tablet Take 1 tablet (40 mg total) by mouth 2 (two) times daily. Patient not taking: Reported on 09/19/2021 03/09/19 11/21/21  [place] Eric J, DO    Current Facility-Administered Medications  Medication Dose Route Frequency Provider Last Rate Last Admin   acetaminophen (TYLENOL) tablet 650 mg  650 mg Oral Q6H PRN Opyd, Ilene Qua, MD       Or   acetaminophen (TYLENOL) suppository 650 mg  650 mg Rectal Q6H PRN Opyd, Ilene Qua, MD       insulin aspart (novoLOG) injection 0-6 Units  0-6 Units Subcutaneous Q4H Opyd, Ilene Qua, MD       pantoprazole (PROTONIX) injection 40 mg  40 mg Intravenous Q12H Opyd, Ilene Qua, MD       sodium chloride flush (NS) 0.9 % injection 3 mL  3 mL Intravenous Q12H Opyd, Ilene Qua, MD       Current Outpatient Medications  Medication Sig Dispense Refill   cetirizine (ZYRTEC) 10 MG tablet Take 10 mg by mouth at bedtime.     furosemide (LASIX) 20 MG tablet Take 20 mg by mouth in the morning.     losartan (COZAAR) 25 MG tablet Take 25 mg by mouth in the morning.     magnesium oxide (MAG-OX) 400 (240 Mg) MG tablet Take 1 tablet (400 mg total) by mouth daily. (Patient taking differently: Take 400 mg by mouth in the morning.) 30 tablet 1   metFORMIN (GLUCOPHAGE) 1000 MG tablet Take 1,000 mg by mouth 2 (two) times daily with a meal.     simvastatin (ZOCOR) 40 MG tablet Take 1 tablet (40 mg total) by mouth daily at 6 PM. 90 tablet 3   SYNTHROID 100 MCG tablet Take 100 mcg by mouth daily before breakfast.     traZODone (DESYREL) 100 MG tablet TAKE 1 TABLET BY MOUTH AT BEDTIME (Patient taking differently: Take 100 mg by mouth at bedtime.) 90 tablet 0    triamcinolone cream (KENALOG) 0.1 % Apply 1 Application topically daily.     VICTOZA 18 MG/3ML SOPN Inject 1.2 mg into the skin in the morning.     amoxicillin-clavulanate (AUGMENTIN) 875-125 MG tablet Take 1 tablet by mouth every 12 (twelve) hours. 14 tablet 0   omeprazole (PRILOSEC) 40 MG capsule Take 40 mg by mouth in the morning.     pantoprazole (PROTONIX) 40 MG tablet Take 1 tablet (40 mg total) by mouth 2 (two) times daily. (Patient not taking: Reported on 09/19/2021) 112 tablet 0    Allergies as of 09/18/2021 - Review Complete 09/18/2021  Allergen Reaction Noted   Lidocaine Shortness Of Breath 12/14/2018   Nitrofurantoin Other (See Comments) 04/06/2021     Review of Systems:    As per HPI, otherwise negative    Physical Exam:  Vital signs in last 24 hours: Temp:  [98.3 F (36.8 C)-99.9 F (37.7 C)] 98.3 F (36.8 C) (09/09 0700) Pulse Rate:  [90-115] 93 (09/09 0700) Resp:  [16-20] 16 (09/09 0504) BP: (103-139)/(62-91) 129/68 (09/09 0700) SpO2:  [96 %-99 %] 97 % (09/09 0700) Weight:  [88 kg] 88 kg (09/08 2111)   General:   Pleasant female in NAD Head:  Normocephalic and atraumatic. Eyes:   No icterus.   Conjunctiva pink. Ears:  Normal auditory acuity. Neck:  Supple Lungs:  Respirations even and unlabored. Lungs clear to auscultation bilaterally.   Heart:  Regular rate and rhythm; no MRG Abdomen:  Soft, nondistended, nontender.  Rectal:  Not  performed.  Msk:  Symmetrical without gross deformities.  Extremities:  Without edema. Neurologic:  Alert and  oriented x4;  grossly normal neurologically. Skin:  Intact without significant lesions or rashes. Psych:  Alert and cooperative. Normal affect.  LAB RESULTS: Recent Labs    09/18/21 1445 09/18/21 2145 09/19/21 0505  WBC 8.4 9.7  --   HGB 10.3* 10.4* 9.6*  HCT 31.9* 32.9* 29.5*  PLT 268 289  --    BMET Recent Labs    09/18/21 1445 09/18/21 2145 09/19/21 0505  NA 138 139 138  K 4.0 4.1 3.8  CL 100 103 101   CO2 '28 23 27  ' GLUCOSE 191* 120* 111*  BUN 23 30* 30*  CREATININE 1.02* 1.09* 0.92  CALCIUM 10.1 10.4* 9.5   LFT Recent Labs    09/18/21 1445  PROT 7.6  ALBUMIN 4.6  AST 17  ALT 17  ALKPHOS 52  BILITOT 0.4   PT/INR Recent Labs    09/18/21 2145  LABPROT 13.4  INR 1.0    STUDIES: No results found.   PREVIOUS ENDOSCOPIES:            Per HPI   Impression / Plan:    76 yo/o female admitted with the following:  Upper GI bleed Anemia Jehovah's witness GERD History of PUD History of small bowel dieulafoy lesion  76 year old female Jehovah's Witness, with a history of gastric ulcer and duodenal dieulafoy lesion, with iron deficiency, presenting with recurrent upper GI bleeding.  With melena and elevated BUN this is very likely an upper tract source.  She has been off PPI for a few months, having worsening reflux lately, denies NSAID use.  We discussed differential diagnosis.  She could have erosive esophagitis, recurrent peptic ulcer disease, recurrent vascular lesions of the upper tract.  I am recommending a push enteroscopy to further evaluate her symptoms and to provide endoscopic therapy if needed.  I discussed risk benefits of endoscopy and anesthesia with her and she wants to proceed.  She is currently n.p.o., hemodynamically stable, denies any cardiopulmonary symptoms.  I would prefer to do her procedure today as she is stable at this time, and avoid further blood loss, she is a Jehovah's Witness and would decline any blood products.  She has been placed on IV Protonix which I agree with, would continue that for now.  We will trend hemoglobin and await her enteroscopy which will hopefully be done this morning.  If this exam is negative, may consider prep for capsule endoscopy/colonoscopy.  Plan: - keep NPO - continue IV protonix - push enteroscopy this AM - further recommendations pending this result - trend Hgb - patient is declining blood transfusions  Call with  questions.  Jolly Mango, MD [facility] Gastroenterology

## 2021-09-19 NOTE — ED Notes (Signed)
Assisted pt to the bathroom

## 2021-09-19 NOTE — Progress Notes (Signed)
Patient admitted early this morning for dark stool and decreased hemoglobin with possible GI bleeding.  She has been started on IV Protonix.  GI has been consulted.  I have reviewed patient's medical records myself including this morning's H&P, current vitals, labs and medications myself.  Most recent hemoglobin is 9.6.  Monitor H&H.  Await GI recommendations.

## 2021-09-19 NOTE — H&P (Signed)
History and Physical    Dana Nielsen YQM:[id number] DOB: [birth year] DOA: 09/18/2021  PCP: Orson Slick, MD   Patient coming from: Home   Chief Complaint: Dark stool, decreased Hgb   HPI: Dana Nielsen is a very pleasant 76 y.o. female [place] with medical history significant for type 2 diabetes mellitus, hypothyroidism, hypertension, and iron deficiency anemia who presents with melena.  Patient reports that her hemoglobin had been slowly trending down over the last couple months and she then had a dark stool yesterday.  She denies abdominal pain, nausea, or vomiting.  She denies using NSAIDs.  She had been off of Protonix for a while and recently started omeprazole.  She has history of bleeding Dieulaoy lesion and nonbleeding gastric ulcers in 2021.  She had an EGD in May 2023 with resection of gastric polyps.  She is followed by hematology for her iron deficiency anemia and has received iron infusions.  ED Course: Upon arrival to the ED, patient is found to be afebrile and saturating well on room air with mild tachycardia and stable blood pressure.  BUN is 30 and creatinine 1.09.  Hemoglobin is 10.4, down from 11.0 on 09/10/2021.  Fecal occult blood testing is positive.  Review of Systems:  All other systems reviewed and apart from HPI, are negative.  Past Medical History:  Diagnosis Date   Diabetes mellitus type 2, diet-controlled ([place])    Family history of colon cancer 12/09/2020   Family history of lung cancer 12/09/2020   Family history of multiple myeloma 12/09/2020   HLD (hyperlipidemia)    Hypothyroidism    Iron deficiency anemia     Past Surgical History:  Procedure Laterality Date   arthroscopic knee     BIOPSY  03/08/2019   Procedure: BIOPSY;  Surgeon: Lavena Bullion, DO;  [facility];  Service: Gastroenterology;;   C sections     x 7   COLONOSCOPY  2019   x2 At the [facility] and in [place]   ENTEROSCOPY N/A 03/08/2019    Procedure: ENTEROSCOPY;  Surgeon: Lavena Bullion, DO;  [facility];  Service: Gastroenterology;  Laterality: N/A;   HEMOSTASIS CLIP PLACEMENT  03/08/2019   Procedure: HEMOSTASIS CLIP PLACEMENT;  Surgeon: Lavena Bullion, DO;  [facility];  Service: Gastroenterology;;   SUBMUCOSAL TATTOO INJECTION  03/08/2019   Procedure: SUBMUCOSAL TATTOO INJECTION;  Surgeon: Lavena Bullion, DO;  [facility];  Service: Gastroenterology;;   TONSILLECTOMY     WISDOM TOOTH EXTRACTION     WRIST SURGERY      Social History:   reports that she has never smoked. She has never used smokeless tobacco. She reports that she does not currently use alcohol. She reports that she does not use drugs.  Allergies  Allergen Reactions   Lidocaine Shortness Of Breath   Nitrofurantoin Other (See Comments)    Headache, confusion, dizziness, and muscle pain    Family History  Problem Relation Age of Onset   Multiple myeloma Mother 19   Heart attack Father    Lung cancer Sister 90       smoking hx   Leukemia Paternal Aunt        dx before 59   Cancer Paternal Aunt        unknown type; dx after 46   Colon cancer Paternal Aunt        dx after 7   Head & neck cancer Paternal Uncle  dx before 56   Cancer Paternal Uncle        unknown type; dx after 50   Cancer Paternal Uncle        color or gastric; dx after 50     Prior to Admission medications   Medication Sig Start Date End Date Taking? Authorizing Provider  ACCU-CHEK AVIVA PLUS test strip  03/09/19   [provider]  amoxicillin-clavulanate (AUGMENTIN) 875-125 MG tablet Take 1 tablet by mouth every 12 (twelve) hours. 09/04/21   Estanislado Pandy, PA  EQ ALLERGY RELIEF, CETIRIZINE, 10 MG tablet Take 1 tablet by mouth once daily 12/12/19   Worthy Rancher B, FNP  furosemide (LASIX) 20 MG tablet Take 20 mg by mouth daily.    [provider]  losartan (COZAAR) 25 MG tablet Take 25 mg by mouth daily.     [provider]  magnesium oxide (MAG-OX) 400 (240 Mg) MG tablet Take 1 tablet (400 mg total) by mouth daily. 02/11/21   Jaci Standard, MD  metFORMIN (GLUCOPHAGE) 1000 MG tablet Take 1,000 mg by mouth 2 (two) times daily with a meal.    [provider]  pantoprazole (PROTONIX) 40 MG tablet Take 1 tablet (40 mg total) by mouth 2 (two) times daily. 03/09/19 05/04/19  [place], Alvira Philips, DO  simvastatin (ZOCOR) 40 MG tablet Take 1 tablet (40 mg total) by mouth daily at 6 PM. 04/06/19   Mliss Sax, MD  SYNTHROID 100 MCG tablet Take 100 mcg by mouth daily before breakfast.    [provider]  traZODone (DESYREL) 100 MG tablet TAKE 1 TABLET BY MOUTH AT BEDTIME 09/20/19   Mliss Sax, MD  venlafaxine [facility]) 75 MG tablet Take 75 mg by mouth daily.     [provider]  vitamin B-12 (CYANOCOBALAMIN) 100 MCG tablet 1 tablet    [provider]    Physical Exam: Vitals:   09/19/21 0012 09/19/21 0045 09/19/21 0400 09/19/21 0504  BP: 137/73 139/71  111/62  Pulse: (!) 108 (!) 115  90  Resp: 18 20    Temp:   98.7 F (37.1 C)   TempSrc:   Oral   SpO2: 98% 97%  96%  Weight:      Height:        Constitutional: NAD, calm  Eyes: PERTLA, lids and conjunctivae normal ENMT: Mucous membranes are moist. Posterior pharynx clear of any exudate or lesions.   Neck: supple, no masses  Respiratory: no wheezing, no crackles. No accessory muscle use.  Cardiovascular: S1 & S2 heard, regular rate and rhythm. No extremity edema.   Abdomen: No distension, no tenderness, soft. Bowel sounds active.  Musculoskeletal: no clubbing / cyanosis. No joint deformity upper and lower extremities.   Skin: no significant rashes, lesions, ulcers. Warm, dry, well-perfused. Neurologic: CN 2-12 grossly intact. Moving all extremities. Alert and oriented.  Psychiatric: Very pleasant. Cooperative.    Labs and Imaging on Admission: I have personally reviewed following labs  and imaging studies  CBC: Recent Labs  Lab 09/18/21 1445 09/18/21 2145  WBC 8.4 9.7  NEUTROABS 5.4  --   HGB 10.3* 10.4*  HCT 31.9* 32.9*  MCV 90.1 90.6  PLT 268 289   Basic Metabolic Panel: Recent Labs  Lab 09/18/21 1445 09/18/21 2145  NA 138 139  K 4.0 4.1  CL 100 103  CO2 28 23  GLUCOSE 191* 120*  BUN 23 30*  CREATININE 1.02* 1.09*  CALCIUM 10.1 10.4*  GFR: Estimated Creatinine Clearance: 45.3 mL/min (A) (by C-G formula based on SCr of 1.09 mg/dL (H)). Liver Function Tests: Recent Labs  Lab 09/18/21 1445  AST 17  ALT 17  ALKPHOS 52  BILITOT 0.4  PROT 7.6  ALBUMIN 4.6   No results for input(s): "LIPASE", "AMYLASE" in the last 168 hours. No results for input(s): "AMMONIA" in the last 168 hours. Coagulation Profile: Recent Labs  Lab 09/18/21 2145  INR 1.0   Cardiac Enzymes: No results for input(s): "CKTOTAL", "CKMB", "CKMBINDEX", "TROPONINI" in the last 168 hours. BNP (last 3 results) No results for input(s): "PROBNP" in the last 8760 hours. HbA1C: No results for input(s): "HGBA1C" in the last 72 hours. CBG: No results for input(s): "GLUCAP" in the last 168 hours. Lipid Profile: No results for input(s): "CHOL", "HDL", "LDLCALC", "TRIG", "CHOLHDL", "LDLDIRECT" in the last 72 hours. Thyroid Function Tests: No results for input(s): "TSH", "T4TOTAL", "FREET4", "T3FREE", "THYROIDAB" in the last 72 hours. Anemia Panel: Recent Labs    09/18/21 1444 09/18/21 1445  FERRITIN  --  181  TIBC  --  399  IRON  --  31  RETICCTPCT 3.0  --    Urine analysis:    Component Value Date/Time   COLORURINE YELLOW 12/15/2018 1058   APPEARANCEUR CLEAR 12/15/2018 1058   LABSPEC 1.025 04/02/2021 1538   PHURINE 5.5 04/02/2021 1538   GLUCOSEU NEGATIVE 04/02/2021 1538   GLUCOSEU NEGATIVE 12/15/2018 1058   HGBUR LARGE (A) 04/02/2021 1538   BILIRUBINUR NEGATIVE 04/02/2021 1538   KETONESUR NEGATIVE 04/02/2021 1538   PROTEINUR 100 (A) 04/02/2021 1538   UROBILINOGEN  0.2 04/02/2021 1538   NITRITE NEGATIVE 04/02/2021 1538   LEUKOCYTESUR MODERATE (A) 04/02/2021 1538   Sepsis Labs: _0 (procalcitonin:4,lacticidven:4) )No results found for this or any previous visit (from the past 240 hour(s)).   Radiological Exams on Admission: No results found.  Assessment/Plan   1. 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 Type II DM  - Check CBGs and use low-intensity SSI if needed    3. HTN  - Treat as-needed only for now    DVT prophylaxis: SCDs  Code Status: Full  Level of Care: Level of care: Telemetry Family Communication: None present  Disposition Plan:  Patient is from: home  Anticipated d/c is to: home  Anticipated d/c date is: Possibly as early as 09/20/21  Patient currently: Pending stable H&H, GI consultation  Consults called: Secure chat sent to [facility] with request for routine consult  Admission status: Observation     Vianne Bulls, MD [facility]  09/19/2021, 5:06 AM

## 2021-09-19 NOTE — Anesthesia Preprocedure Evaluation (Signed)
Anesthesia Evaluation  Patient identified by MRN, date of birth, ID band Patient awake    Reviewed: Allergy & Precautions, H&P , NPO status , Patient's Chart, lab work & pertinent test results  Airway Mallampati: II   Neck ROM: full    Dental   Pulmonary neg pulmonary ROS,    breath sounds clear to auscultation       Cardiovascular hypertension,  Rhythm:regular Rate:Normal     Neuro/Psych PSYCHIATRIC DISORDERS Depression  Neuromuscular disease    GI/Hepatic hiatal hernia, PUD, GI bleeding   Endo/Other  diabetes, Type 2Hypothyroidism   Renal/GU      Musculoskeletal   Abdominal   Peds  Hematology  (+) Blood dyscrasia, anemia , REFUSES BLOOD PRODUCTS, [employer]'s Witness   Anesthesia Other Findings   Reproductive/Obstetrics                             Anesthesia Physical Anesthesia Plan  ASA: 3  Anesthesia Plan: MAC   Post-op Pain Management:    Induction: Intravenous  PONV Risk Score and Plan: 2 and Propofol infusion and Treatment may vary due to age or medical condition  Airway Management Planned: Nasal Cannula  Additional Equipment:   Intra-op Plan:   Post-operative Plan:   Informed Consent: I have reviewed the patients History and Physical, chart, labs and discussed the procedure including the risks, benefits and alternatives for the proposed anesthesia with the patient or authorized representative who has indicated his/her understanding and acceptance.     Dental advisory given  Plan Discussed with: Anesthesiologist, CRNA and Surgeon  Anesthesia Plan Comments:         Anesthesia Quick Evaluation

## 2021-09-19 NOTE — Op Note (Signed)
[facility] Patient Name: Dana Nielsen Procedure Date: 09/19/2021 MRN: [id number] Attending MD: Carlota Raspberry. Havery Moros , MD Date of Birth: 04/07/1945 CSN: [id number] Age: 76 Admit Type: Outpatient Procedure:                Small bowel enteroscopy Indications:              Melena / Upper GI bleed - history of PUD (off                            protonix) and history of bleeding diuelafoy lesion                            in the duodenum in the past Providers:                Remo Lipps P. Havery Moros, MD, Doristine Johns, RN,                            William Dalton, Technician Referring MD:              Medicines:                Monitored Anesthesia Care Complications:            No immediate complications. Estimated blood loss:                            Minimal. Estimated Blood Loss:     Estimated blood loss was minimal. Procedure:                Pre-Anesthesia Assessment:                           - Prior to the procedure, a History and Physical                            was performed, and patient medications and                            allergies were reviewed. The patient's tolerance of                            previous anesthesia was also reviewed. The risks                            and benefits of the procedure and the sedation                            options and risks were discussed with the patient.                            All questions were answered, and informed consent                            was obtained. Prior Anticoagulants: The patient has  taken no previous anticoagulant or antiplatelet                            agents. ASA Grade Assessment: III - A patient with                            severe systemic disease. After reviewing the risks                            and benefits, the patient was deemed in                            satisfactory condition to undergo the procedure.                           After  obtaining informed consent, the endoscope was                            passed under direct vision. Throughout the                            procedure, the patient's blood pressure, pulse, and                            oxygen saturations were monitored continuously. The                            PCF-H190TL ([id number]) Olympus slim colonoscope was                            introduced through the mouth and advanced to the                            proximal jejunum. The small bowel enteroscopy was                            accomplished without difficulty. The patient                            tolerated the procedure well. Scope In: Scope Out: Findings:      Esophagogastric landmarks were identified: the Z-line was found at 32       cm, the gastroesophageal junction was found at 32 cm and the upper       extent of the gastric folds was found at 37 cm from the incisors.      A sliding 5 cm hiatal hernia was present.      The exam of the esophagus was otherwise normal.      A few small sessile polyps were found in the gastric body consistent       with known fundic gland polyps, these were not removed.      The exam of the stomach was otherwise normal.      A small amount of red blood was found in the 2nd, third portion of the       duodenum and in the  fourth portion of the duodenum and was lavaged.       Initially during this process I could not identify a bleeding lesion.      A tattoo was seen in the third portion of the duodenum. The tattoo site       appeared normal.      During initial intubation of the duodenum, red blood was noted but       following lavage I could not identify a lesion to cause this. After       evaluation of jejunum, upon withdrawal active oozing was coming from the       3rd to 4th portion of the duodenum, a few cms distal to prior tattoo       placement. Following lavage a single spot (dieulafoy lesion) with       bleeding was found in the fourth portion of  the duodenum. Fulguration to       stop the bleeding by argon plasma was successful. To prevent bleeding       post-intervention, two hemostatic clips were successfully placed across       the lesion. There was significant bowel spasm and treating the lesion       was quite tedious and time consuming. No active bleeding following       endoscopic treatment.      The exam of the duodenum was otherwise normal.      There was no evidence of significant pathology in the proximal jejunum.       Initially without clear source of blood loss during intubation, distal       aspect of jejunum reached was tattooed with an injection of Spot (carbon       black). Impression:               - Esophagogastric landmarks identified.                           - 5 cm hiatal hernia.                           - Normal esophagus                           - A few gastric polyps.                           - Normal stomach otherwise                           - Blood in the third portion of the duodenum and in                            the fourth portion of the duodenum.                           - A tattoo was seen in the 3rd portion of the                            duodenum. The tattoo site appeared normal.                           -  A dieulafoy lesion with bleeding in the distal                            duodenum, a few cms distal to prior tattoo. Treated                            with argon plasma coagulation (APC). Clips were                            placed.                           - The examined portion of the jejunum was normal.                            Distal aspect reached was tattooed.                           Bleeding dieulafoy lesion is the cause of patient's                            recent bleeding, treated endoscopically with good                            result. Recommendation:           - Return patient to hospital ward for ongoing care.                           - Clear liquid  diet today                           - Continue present medications.                           - Trend Hgb                           - GI service will evaluate her tomorrow AM. She may                            pass more melena in the interim given blood in the                            lumen noted. Procedure Code(s):        --- Professional ---                           [id number], Small intestinal endoscopy, enteroscopy                            beyond second portion of duodenum, not including                            ileum; with control of bleeding (eg, injection,  bipolar cautery, unipolar cautery, laser, heater                            probe, stapler, plasma coagulator)                           44799, Unlisted procedure, small intestine Diagnosis Code(s):        --- Professional ---                           K44.9, Diaphragmatic hernia without obstruction or                            gangrene                           K31.7, Polyp of stomach and duodenum                           K92.2, Gastrointestinal hemorrhage, unspecified                           K92.1, Melena (includes Hematochezia) CPT copyright 2019 [employer]. All rights reserved. The codes documented in this report are preliminary and upon coder review may  be revised to meet current compliance requirements. Remo Lipps P. Aaniyah Strohm, MD 09/19/2021 9:56:28 AM This report has been signed electronically. Number of Addenda: 0

## 2021-09-19 NOTE — Progress Notes (Signed)
Handoff given to ED RN

## 2021-09-19 NOTE — Transfer of Care (Signed)
Immediate Anesthesia Transfer of Care Note  Patient: Dana Nielsen  Procedure(s) Performed: Procedure(s): ENTEROSCOPY (N/A) SUBMUCOSAL TATTOO INJECTION HOT HEMOSTASIS (ARGON PLASMA COAGULATION/BICAP) (N/A) HEMOSTASIS CLIP PLACEMENT  Patient Location: PACU and Endoscopy Unit  Anesthesia Type:MAC  Level of Consciousness: awake, alert  and oriented  Airway & Oxygen Therapy: Patient Spontanous Breathing and Patient connected to nasal cannula oxygen  Post-op Assessment: Report given to RN and Post -op Vital signs reviewed and stable  Post vital signs: Reviewed and stable  Last Vitals:  Vitals:   09/19/21 0835 09/19/21 0940  BP: 132/78   Pulse: 95 92  Resp:  15  Temp: 36.7 C (!) 36.4 C  SpO2: 914% 782%    Complications: No apparent anesthesia complications

## 2021-09-19 NOTE — ED Notes (Signed)
ED TO INPATIENT HANDOFF REPORT  ED Nurse Name and Phone #:   S Name/Age/Gender Dana Nielsen 76 y.o. female Room/Bed: WA18/WA18  Code Status   Code Status: Full Code  Home/SNF/Other  Patient oriented to: self, place, time, and situation Is this baseline? Yes   Triage Complete: Triage complete  Chief Complaint Acute GI bleeding [K92.2] Occult GI bleeding [R19.5] GI bleed [K92.2]  Triage Note Pt states that her blood is slowly going down. Pt reports having a dark stool today. She states that her doctor told her to come to the emergency room.    Allergies Allergies  Allergen Reactions   Lidocaine Shortness Of Breath   Macrobid [Nitrofurantoin] Other (See Comments)    Headache Confusion  Dizziness Myalgias     Level of Care/Admitting Diagnosis ED Disposition     ED Disposition  Admit   Condition  --   Comment  Hospital Area: [facility] [[id number]]  Level of Care: Telemetry [5]  Admit to tele based on following criteria: Other see comments  Comments: GI bleed  May admit patient to [facility] if equivalent level of care is available:: Yes  Covid Evaluation: Asymptomatic - no recent exposure (last 10 days) testing not required  Diagnosis: GI bleed [220254]  Admitting Physician: Aline August [[id number]]  Attending Physician: Aline August [[id number]]  Certification:: I certify this patient will need inpatient services for at least 2 midnights          B Medical/Surgery History Past Medical History:  Diagnosis Date   Diabetes mellitus type 2, diet-controlled ([place])    Family history of colon cancer 12/09/2020   Family history of lung cancer 12/09/2020   Family history of multiple myeloma 12/09/2020   HLD (hyperlipidemia)    Hypothyroidism    Iron deficiency anemia    Past Surgical History:  Procedure Laterality Date   arthroscopic knee     BIOPSY  03/08/2019   Procedure: BIOPSY;  Surgeon: Lavena Bullion, DO;   Location: [facility];  Service: Gastroenterology;;   C sections     x 7   COLONOSCOPY  2019   x2 At the [facility] and in [place] [place]   ENTEROSCOPY N/A 03/08/2019   Procedure: ENTEROSCOPY;  Surgeon: Lavena Bullion, DO;  Location: [facility];  Service: Gastroenterology;  Laterality: N/A;   HEMOSTASIS CLIP PLACEMENT  03/08/2019   Procedure: HEMOSTASIS CLIP PLACEMENT;  Surgeon: Lavena Bullion, DO;  Location: [facility];  Service: Gastroenterology;;   SUBMUCOSAL TATTOO INJECTION  03/08/2019   Procedure: SUBMUCOSAL TATTOO INJECTION;  Surgeon: Lavena Bullion, DO;  Location: [facility];  Service: Gastroenterology;;   TONSILLECTOMY     WISDOM TOOTH EXTRACTION     WRIST SURGERY       A IV Location/Drains/Wounds Patient Lines/Drains/Airways Status     Active Line/Drains/Airways     Name Placement date Placement time Site Days   Peripheral IV 09/19/21 20 G Left Antecubital 09/19/21  0544  Antecubital  less than 1            Intake/Output Last 24 hours  Intake/Output Summary (Last 24 hours) at 09/19/2021 1718 Last data filed at 09/19/2021 7616 Gross per 24 hour  Intake 800 ml  Output --  Net 800 ml    Labs/Imaging Results for orders placed or performed during the hospital encounter of 09/18/21 (from the past 48 hour(s))  CBC     Status: Abnormal   Collection Time: 09/18/21  9:45 PM  Result  Value Ref Range   WBC 9.7 4.0 - 10.5 K/uL   RBC 3.63 (L) 3.87 - 5.11 MIL/uL   Hemoglobin 10.4 (L) 12.0 - 15.0 g/dL   HCT 32.9 (L) 36.0 - 46.0 %   MCV 90.6 80.0 - 100.0 fL   MCH 28.7 26.0 - 34.0 pg   MCHC 31.6 30.0 - 36.0 g/dL   RDW 14.5 11.5 - 15.5 %   Platelets 289 150 - 400 K/uL   nRBC 0.0 0.0 - 0.2 %    Comment: Performed at [facility], [place] [address] [place], [place] [address]  Protime-INR     Status: None   Collection Time: 09/18/21  9:45 PM  Result Value Ref Range   Prothrombin Time 13.4 11.4 - 15.2 seconds   INR 1.0 0.8 - 1.2    Comment:  (NOTE) INR goal varies based on device and disease states. Performed at [facility], [place] [address], [place], [place] [address]   Basic metabolic panel     Status: Abnormal   Collection Time: 09/18/21  9:45 PM  Result Value Ref Range   Sodium 139 135 - 145 mmol/L   Potassium 4.1 3.5 - 5.1 mmol/L   Chloride 103 98 - 111 mmol/L   CO2 23 22 - 32 mmol/L   Glucose, Bld 120 (H) 70 - 99 mg/dL    Comment: Glucose reference range applies only to samples taken after fasting for at least 8 hours.   BUN 30 (H) 8 - 23 mg/dL   Creatinine, Ser 1.09 (H) 0.44 - 1.00 mg/dL   Calcium 10.4 (H) 8.9 - 10.3 mg/dL   GFR, Estimated 53 (L) >60 mL/min    Comment: (NOTE) Calculated using the CKD-EPI Creatinine Equation (2021)    Anion gap 13 5 - 15    Comment: Performed at [facility], [place] [address], [place], [place] [address]  Type and screen [place]     Status: None   Collection Time: 09/18/21  9:45 PM  Result Value Ref Range   ABO/RH(D) A POS    Antibody Screen NEG    Sample Expiration      09/21/2021,2359 Performed at [facility], [place] [address] [place], [place] [address]   POC occult blood, ED     Status: Abnormal   Collection Time: 09/19/21 12:53 AM  Result Value Ref Range   Fecal Occult Bld POSITIVE (A) NEGATIVE  Hemoglobin A1c     Status: Abnormal   Collection Time: 09/19/21  5:01 AM  Result Value Ref Range   Hgb A1c MFr Bld 6.6 (H) 4.8 - 5.6 %    Comment: (NOTE) Pre diabetes:          5.7%-6.4%  Diabetes:              >6.4%  Glycemic control for   <7.0% adults with diabetes    Mean Plasma Glucose 142.72 mg/dL    Comment: Performed at [place] [address] [place],  [address]  Basic metabolic panel     Status: Abnormal   Collection Time: 09/19/21  5:05 AM  Result Value Ref Range   Sodium 138 135 - 145 mmol/L   Potassium 3.8 3.5 - 5.1 mmol/L   Chloride 101 98 - 111 mmol/L   CO2  27 22 - 32 mmol/L   Glucose, Bld 111 (H) 70 - 99 mg/dL    Comment: Glucose reference range applies only to samples  taken after fasting for at least 8 hours.   BUN 30 (H) 8 - 23 mg/dL   Creatinine, Ser 0.92 0.44 - 1.00 mg/dL   Calcium 9.5 8.9 - 10.3 mg/dL   GFR, Estimated >60 >60 mL/min    Comment: (NOTE) Calculated using the CKD-EPI Creatinine Equation (2021)    Anion gap 10 5 - 15    Comment: Performed at [facility], [place] [address], [place], [place] [address]  Magnesium     Status: Abnormal   Collection Time: 09/19/21  5:05 AM  Result Value Ref Range   Magnesium 1.3 (L) 1.7 - 2.4 mg/dL    Comment: Performed at [facility], [place] [address] [place], [place] [address]  Hemoglobin     Status: Abnormal   Collection Time: 09/19/21  5:05 AM  Result Value Ref Range   Hemoglobin 9.6 (L) 12.0 - 15.0 g/dL    Comment: Performed at [facility], [place] [address], [place], [place] [address]  Hematocrit     Status: Abnormal   Collection Time: 09/19/21  5:05 AM  Result Value Ref Range   HCT 29.5 (L) 36.0 - 46.0 %    Comment: Performed at [facility], [place] [address] [place], [place] [address]  CBG monitoring, ED     Status: Abnormal   Collection Time: 09/19/21  7:46 AM  Result Value Ref Range   Glucose-Capillary 124 (H) 70 - 99 mg/dL    Comment: Glucose reference range applies only to samples taken after fasting for at least 8 hours.  CBG monitoring, ED     Status: Abnormal   Collection Time: 09/19/21 12:01 PM  Result Value Ref Range   Glucose-Capillary 125 (H) 70 - 99 mg/dL    Comment: Glucose reference range applies only to samples taken after fasting for at least 8 hours.  CBG monitoring, ED     Status: Abnormal   Collection Time: 09/19/21  4:10 PM  Result Value Ref Range   Glucose-Capillary 117 (H) 70 - 99 mg/dL    Comment: Glucose reference range applies only to samples taken after fasting for at least 8  hours.   No results found.  Pending Labs Unresulted Labs (From admission, onward)     Start     Ordered   09/20/21 0500  CBC with Differential/Platelet  Tomorrow morning,   R        09/19/21 1016   09/20/21 0500  Magnesium  Tomorrow morning,   R        09/19/21 1016   09/19/21 1300  Hemoglobin and hematocrit, blood  Once,   R        09/19/21 0959   09/19/21 2774  Basic metabolic panel  Daily at 5am,   R      09/19/21 0505            Vitals/Pain Today's Vitals   09/19/21 1108 09/19/21 1445 09/19/21 1515 09/19/21 1645  BP:  119/68 119/79 126/74  Pulse:  86 86 90  Resp:  _0 Temp: 98.4 F (36.9 C)  98.6 F (37 C)   TempSrc: Oral  Oral   SpO2:  93% 98% 98%  Weight:      Height:      PainSc:        Isolation Precautions No active isolations  Medications Medications  insulin aspart (novoLOG) injection 0-6 Units ( Subcutaneous Not Given 09/19/21 1613)  sodium chloride flush (NS) 0.9 %  injection 3 mL ( Intravenous MAR Unhold 09/19/21 1004)  acetaminophen (TYLENOL) tablet 650 mg ( Oral MAR Unhold 09/19/21 1004)    Or  acetaminophen (TYLENOL) suppository 650 mg ( Rectal MAR Unhold 09/19/21 1004)  pantoprazole (PROTONIX) injection 40 mg ( Intravenous MAR Unhold 09/19/21 1004)  ondansetron (ZOFRAN) injection 4 mg (has no administration in time range)  sodium chloride 0.9 % bolus 500 mL (500 mLs Intravenous Bolus 09/19/21 0544)    Mobility walks Moderate fall risk   Focused Assessments     R Recommendations: See Admitting Provider Note  Report given to:   Additional Notes:

## 2021-09-19 NOTE — ED Provider Notes (Signed)
[place] DEPT Provider Note   CSN: [id number] Arrival date & time: 09/18/21  2101     History  Chief Complaint  Patient presents with   Rectal Bleeding    Dana Nielsen is a 76 y.o. female.  The history is provided by the patient.  Rectal Bleeding Quality:  Black and tarry Amount:  Moderate Chronicity:  New Similar prior episodes: yes   Worsened by:  Nothing Associated symptoms: light-headedness   Associated symptoms: no abdominal pain, no fever, no hematemesis and no vomiting   Patient history of obesity, diabetes and previous GI bleed presents with dark stool. Patient reports she has had this previously.  She has a history of chronic anemia, followed by oncology.  Also has a history of a bleeding lesion in her intestines.  She reports recently her hemoglobin has been trending downward.  Earlier tonight she noted black stool, and she is not on iron She is not on anticoagulation   Past Medical History:  Diagnosis Date   Diabetes mellitus type 2, diet-controlled ([place])    Family history of colon cancer 12/09/2020   Family history of lung cancer 12/09/2020   Family history of multiple myeloma 12/09/2020   HLD (hyperlipidemia)    Hypothyroidism    Iron deficiency anemia     Home Medications Prior to Admission medications   Medication Sig Start Date End Date Taking? Authorizing Provider  ACCU-CHEK AVIVA PLUS test strip  03/09/19   [provider]  amoxicillin-clavulanate (AUGMENTIN) 875-125 MG tablet Take 1 tablet by mouth every 12 (twelve) hours. 09/04/21   Delsa Sale, PA  EQ ALLERGY RELIEF, CETIRIZINE, 10 MG tablet Take 1 tablet by mouth once daily 12/12/19   Dutch Quint B, FNP  furosemide (LASIX) 20 MG tablet Take 20 mg by mouth daily.    [provider]  losartan (COZAAR) 25 MG tablet Take 25 mg by mouth daily.    [provider]  magnesium oxide (MAG-OX) 400 (240 Mg) MG tablet Take 1 tablet (400 mg  total) by mouth daily. 02/11/21   Orson Slick, MD  metFORMIN (GLUCOPHAGE) 1000 MG tablet Take 1,000 mg by mouth 2 (two) times daily with a meal.    [provider]  pantoprazole (PROTONIX) 40 MG tablet Take 1 tablet (40 mg total) by mouth 2 (two) times daily. 03/09/19 05/04/19  [place] Donnamarie Poag, DO  simvastatin (ZOCOR) 40 MG tablet Take 1 tablet (40 mg total) by mouth daily at 6 PM. 04/06/19   Libby Maw, MD  SYNTHROID 100 MCG tablet Take 100 mcg by mouth daily before breakfast.    [provider]  traZODone (DESYREL) 100 MG tablet TAKE 1 TABLET BY MOUTH AT BEDTIME 09/20/19   Libby Maw, MD  venlafaxine [facility]) 75 MG tablet Take 75 mg by mouth daily.     [provider]  vitamin B-12 (CYANOCOBALAMIN) 100 MCG tablet 1 tablet    [provider]      Allergies    Lidocaine and Nitrofurantoin    Review of Systems   Review of Systems  Constitutional:  Negative for fever.  Gastrointestinal:  Positive for hematochezia. Negative for abdominal pain, hematemesis and vomiting.  Genitourinary:  Negative for hematuria and vaginal bleeding.  Neurological:  Positive for light-headedness.    Physical Exam Updated Vital Signs BP 139/71   Pulse (!) 115   Temp 98.9 F (37.2 C) (Oral)   Resp 20   Ht 1.575 m (_0 )  Wt 88 kg   SpO2 97%   BMI 35.48 kg/m  Physical Exam CONSTITUTIONAL: Elderly, no acute distress HEAD: Normocephalic/atraumatic EYES: EOMI/PERRL ENMT: Mucous membranes moist NECK: supple no meningeal signs CV: S1/S2 noted, no murmurs/rubs/gallops noted LUNGS: Lungs are clear to auscultation bilaterally, no apparent distress ABDOMEN: soft, nontender, no rebound or guarding, bowel sounds noted throughout abdomen Rectal-black stool noted, positive Hemoccult, female chaperone present NEURO: Pt is awake/alert/appropriate, moves all extremitiesx4.  No facial droop.   EXTREMITIES: pulses normal/equal, full ROM SKIN: warm, color  normal PSYCH: no abnormalities of mood noted, alert and oriented to situation  ED Results / Procedures / Treatments   Labs (all labs ordered are listed, but only abnormal results are displayed) Labs Reviewed  CBC - Abnormal; Notable for the following components:      Result Value   RBC 3.63 (*)    Hemoglobin 10.4 (*)    HCT 32.9 (*)    All other components within normal limits  BASIC METABOLIC PANEL - Abnormal; Notable for the following components:   Glucose, Bld 120 (*)    BUN 30 (*)    Creatinine, Ser 1.09 (*)    Calcium 10.4 (*)    GFR, Estimated 53 (*)    All other components within normal limits  POC OCCULT BLOOD, ED - Abnormal; Notable for the following components:   Fecal Occult Bld POSITIVE (*)    All other components within normal limits  PROTIME-INR  TYPE AND SCREEN    EKG None  Radiology No results found.  Procedures Procedures    Medications Ordered in ED Medications - No data to display  ED Course/ Medical Decision Making/ A&P Clinical Course as of 09/19/21 0159  Sat Sep 19, 2021  0120 Glucose(!): 120 Hyperglycemia [DW]  0120 BUN(!): 30 Dehydration [DW]  0121 Hemoglobin(!): 10.4 Mild anemia [DW]  0126 Previously found to have Anemia secondary to gastric ulcer, gastritis and duodenal Dieulafoy lesion [DW]  0158 Discussed with Dr. Myna Hidalgo for admission [DW]  0159 Patient with previous history of GI bleed that required intervention.  Patient is high risk for worsening.  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  She has received iron infusions before [DW]    Clinical Course User Index [DW] Ripley Fraise, MD                           Medical Decision Making Amount and/or Complexity of Data Reviewed Labs:  Decision-making details documented in ED Course.  Risk Decision regarding hospitalization.   This patient presents to the ED for concern of bleeding/dark stool, this involves an extensive number of treatment options, and is a  complaint that carries with it a high risk of complications and morbidity.  The differential diagnosis includes but is not limited to upper GI bleed, lower GI bleed, variceal bleed, diverticular bleed, peptic ulcer disease  Comorbidities that complicate the patient evaluation: Patient's presentation is complicated by their history of obesity, diabetes, previous GI bleed  Additional history obtained: Records reviewed  oncology records reviewed  Lab Tests: I Ordered, and personally interpreted labs.  The pertinent results include: Mild anemia, positive Hemoccult   Critical Interventions:  Admission  Consultations Obtained: I requested consultation with the admitting physician [facility] hospital[facility] , and discussed  findings as well as pertinent plan - they recommend: Admit  Reevaluation: After the interventions noted above, I reevaluated the patient and found that they have :stayed the same  Complexity  of problems addressed: Patient's presentation is most consistent with  acute presentation with potential threat to life or bodily function  Disposition: After consideration of the diagnostic results and the patient's response to treatment,  I feel that the patent would benefit from admission   .           Final Clinical Impression(s) / ED Diagnoses Final diagnoses:  Acute GI bleeding    Rx / DC Orders ED Discharge Orders     None         Ripley Fraise, MD 09/19/21 0200

## 2021-09-20 ENCOUNTER — Encounter: Payer: Self-pay | Admitting: Hematology and Oncology

## 2021-09-20 DIAGNOSIS — K922 Gastrointestinal hemorrhage, unspecified: Secondary | ICD-10-CM | POA: Diagnosis not present

## 2021-09-20 DIAGNOSIS — K3182 Dieulafoy lesion (hemorrhagic) of stomach and duodenum: Secondary | ICD-10-CM | POA: Diagnosis not present

## 2021-09-20 DIAGNOSIS — I10 Essential (primary) hypertension: Secondary | ICD-10-CM | POA: Diagnosis not present

## 2021-09-20 DIAGNOSIS — E118 Type 2 diabetes mellitus with unspecified complications: Secondary | ICD-10-CM | POA: Diagnosis not present

## 2021-09-20 LAB — CBC WITH DIFFERENTIAL/PLATELET
Abs Immature Granulocytes: 0.04 10*3/uL (ref 0.00–0.07)
Basophils Absolute: 0.1 10*3/uL (ref 0.0–0.1)
Basophils Relative: 1 %
Eosinophils Absolute: 0.2 10*3/uL (ref 0.0–0.5)
Eosinophils Relative: 2 %
HCT: 27.2 % — ABNORMAL LOW (ref 36.0–46.0)
Hemoglobin: 8.5 g/dL — ABNORMAL LOW (ref 12.0–15.0)
Immature Granulocytes: 1 %
Lymphocytes Relative: 27 %
Lymphs Abs: 2.2 10*3/uL (ref 0.7–4.0)
MCH: 28.6 pg (ref 26.0–34.0)
MCHC: 31.3 g/dL (ref 30.0–36.0)
MCV: 91.6 fL (ref 80.0–100.0)
Monocytes Absolute: 0.7 10*3/uL (ref 0.1–1.0)
Monocytes Relative: 9 %
Neutro Abs: 5.1 10*3/uL (ref 1.7–7.7)
Neutrophils Relative %: 60 %
Platelets: 230 10*3/uL (ref 150–400)
RBC: 2.97 MIL/uL — ABNORMAL LOW (ref 3.87–5.11)
RDW: 14.7 % (ref 11.5–15.5)
WBC: 8.3 10*3/uL (ref 4.0–10.5)
nRBC: 0 % (ref 0.0–0.2)

## 2021-09-20 LAB — BASIC METABOLIC PANEL
Anion gap: 7 (ref 5–15)
BUN: 14 mg/dL (ref 8–23)
CO2: 29 mmol/L (ref 22–32)
Calcium: 9.1 mg/dL (ref 8.9–10.3)
Chloride: 105 mmol/L (ref 98–111)
Creatinine, Ser: 0.93 mg/dL (ref 0.44–1.00)
GFR, Estimated: >60 mL/min (ref >60)
Glucose, Bld: 129 mg/dL — ABNORMAL HIGH (ref 70–99)
Potassium: 4.1 mmol/L (ref 3.5–5.1)
Sodium: 141 mmol/L (ref 135–145)

## 2021-09-20 LAB — GLUCOSE, CAPILLARY
Glucose-Capillary: 161 mg/dL — ABNORMAL HIGH (ref 70–99)
Glucose-Capillary: 118 mg/dL — ABNORMAL HIGH (ref 70–99)
Glucose-Capillary: 210 mg/dL — ABNORMAL HIGH (ref 70–99)
Glucose-Capillary: 159 mg/dL — ABNORMAL HIGH (ref 70–99)
Glucose-Capillary: 134 mg/dL — ABNORMAL HIGH (ref 70–99)

## 2021-09-20 LAB — MAGNESIUM: Magnesium: 1.5 mg/dL — ABNORMAL LOW (ref 1.7–2.4)

## 2021-09-20 MED ORDER — SODIUM CHLORIDE 0.9 % IV SOLN
250.0000 mg | Freq: Every day | INTRAVENOUS | Status: AC
  Administered 2021-09-20 – 2021-09-21 (×2): 250 mg via INTRAVENOUS
  Filled 2021-09-20 (×2): qty 20

## 2021-09-20 NOTE — Progress Notes (Signed)
PROGRESS NOTE    Dana Nielsen  BJS:[id number] DOB: June 20, 1945 DOA: 09/18/2021 PCP: Orson Slick, MD   Brief Narrative:  76 y.o. female [place] with medical history significant for type 2 diabetes mellitus, hypothyroidism, hypertension, and iron deficiency anemia, history of Dieulafoy lesion and nonbleeding gastric ulcers in 2021 presented with melena.  On presentation, hemoglobin was 10.4, down from 11 on 09/10/2021 and fecal occult blood testing was positive.  She was started on IV Protonix.  GI was consulted.  Assessment & Plan:   Possible upper GI bleeding presenting with melena Dieulafoy lesion with bleeding in distal duodenum -Presented with melena and hemoglobin of 10.4, down from 11 on 09/10/2021 with fecal occult blood testing positive.  Currently on IV Protonix. -Status post EGD on 09/19/2021: Dieulafoy lesion with bleeding in distal duodenum seen: Treated with APC and clips.  Currently on clear liquid diet.  Diet advancement as per GI.  Follow further GI recommendations.  Repeat a.m. hemoglobin.  Hemoglobin this morning is 8.5  Hypomagnesemia -Replace.  Repeat a.m. labs  History of iron deficiency anemia -Patient states that she was supposed to get iron transfusion by Dr. Lorenso Courier as an outpatient and is requesting iron transfusion while she is in the hospital.  Will order IV iron transfusion  Diabetes mellitus type 2 -A1c 6.6.  Continue CBGs with SSI  Hypertension -Blood pressure currently stable.  Continue monitoring.  Obesity -Outpatient follow-up   DVT prophylaxis: SCDs Code Status: Full Family Communication: None at bedside Disposition Plan: Status is: Inpatient Remains inpatient appropriate because: Of severity of illness.  Consultants: GI  Procedures: EGD on 09/19/2021  Antimicrobials: None   Subjective: Patient seen and examined at bedside.  Feels better.  Denies any overnight fever, nausea, vomiting or worsening abdominal  pain.  Objective: Vitals:   09/19/21 1645 09/19/21 1757 09/19/21 2049 09/20/21 0418  BP: 126/74 (!) 142/91 117/66 119/72  Pulse: 90 98 (!) 102 90  Resp: '17 18 18 19  '$ Temp:  99.5 F (37.5 C) 99.3 F (37.4 C) 98.4 F (36.9 C)  TempSrc:  Oral Oral Oral  SpO2: 98% 97% 98% 91%  Weight:    90.2 kg  Height:  '5\' 2"'$  (1.575 m)     No intake or output data in the 24 hours ending 09/20/21 1024 Filed Weights   09/18/21 2111 09/20/21 0418  Weight: 88 kg 90.2 kg    Examination:  General exam: Appears calm and comfortable.  Currently on room air. Respiratory system: Bilateral decreased breath sounds at bases with some scattered crackles Cardiovascular system: S1 & S2 heard, mild intermittent tachycardia present  gastrointestinal system: Abdomen is obese, nondistended, soft and nontender. Normal bowel sounds heard. Extremities: No cyanosis, clubbing, trace lower extremity edema Central nervous system: Alert and oriented. No focal neurological deficits. Moving extremities Skin: No rashes, lesions or ulcers Psychiatry: Judgement and insight appear normal. Mood & affect appropriate.     Data Reviewed: I have personally reviewed following labs and imaging studies  CBC: Recent Labs  Lab 09/18/21 1445 09/18/21 2145 09/19/21 0505 09/19/21 1844 09/20/21 0537  WBC 8.4 9.7  --   --  8.3  NEUTROABS 5.4  --   --   --  5.1  HGB 10.3* 10.4* 9.6* 9.1* 8.5*  HCT 31.9* 32.9* 29.5* 29.2* 27.2*  MCV 90.1 90.6  --   --  91.6  PLT 268 289  --   --  607   Basic Metabolic Panel: Recent Labs  Lab 09/18/21 1445  09/18/21 2145 09/19/21 0505 09/20/21 0537  NA 138 139 138 141  K 4.0 4.1 3.8 4.1  CL 100 103 101 105  CO2 '28 23 27 29  '$ GLUCOSE 191* 120* 111* 129*  BUN 23 30* 30* 14  CREATININE 1.02* 1.09* 0.92 0.93  CALCIUM 10.1 10.4* 9.5 9.1  MG  --   --  1.3* 1.5*   GFR: Estimated Creatinine Clearance: 53.7 mL/min (by C-G formula based on SCr of 0.93 mg/dL). Liver Function Tests: Recent Labs   Lab 09/18/21 1445  AST 17  ALT 17  ALKPHOS 52  BILITOT 0.4  PROT 7.6  ALBUMIN 4.6   No results for input(s): "LIPASE", "AMYLASE" in the last 168 hours. No results for input(s): "AMMONIA" in the last 168 hours. Coagulation Profile: Recent Labs  Lab 09/18/21 2145  INR 1.0   Cardiac Enzymes: No results for input(s): "CKTOTAL", "CKMB", "CKMBINDEX", "TROPONINI" in the last 168 hours. BNP (last 3 results) No results for input(s): "PROBNP" in the last 8760 hours. HbA1C: Recent Labs    09/19/21 0501  HGBA1C 6.6*   CBG: Recent Labs  Lab 09/19/21 1610 09/19/21 2050 09/19/21 2355 09/20/21 0419 09/20/21 0801  GLUCAP 117* 166* 155* 161* 118*   Lipid Profile: No results for input(s): "CHOL", "HDL", "LDLCALC", "TRIG", "CHOLHDL", "LDLDIRECT" in the last 72 hours. Thyroid Function Tests: No results for input(s): "TSH", "T4TOTAL", "FREET4", "T3FREE", "THYROIDAB" in the last 72 hours. Anemia Panel: Recent Labs    09/18/21 1444 09/18/21 1445  FERRITIN  --  181  TIBC  --  399  IRON  --  31  RETICCTPCT 3.0  --    Sepsis Labs: No results for input(s): "PROCALCITON", "LATICACIDVEN" in the last 168 hours.  No results found for this or any previous visit (from the past 240 hour(s)).       Radiology Studies: No results found.      Scheduled Meds:  insulin aspart  0-6 Units Subcutaneous Q4H   pantoprazole (PROTONIX) IV  40 mg Intravenous Q12H   sodium chloride flush  3 mL Intravenous Q12H   Continuous Infusions:        Aline August, MD [facility] 09/20/2021, 10:24 AM

## 2021-09-20 NOTE — Progress Notes (Signed)
Progress Note   Subjective  Patient doing well.  She has not had any bowel movements overnight.  She denies any complaints.  No abdominal pains.  Blood pressure heart rate stable.  Hemoglobin down trended slightly, although BUN also down trended nicely.   Objective   Vital signs in last 24 hours: Temp:  [98.4 F (36.9 C)-99.5 F (37.5 C)] 98.4 F (36.9 C) (09/10 0418) Pulse Rate:  [81-102] 90 (09/10 0418) Resp:  [14-20] 19 (09/10 0418) BP: (105-142)/(44-91) 119/72 (09/10 0418) SpO2:  [91 %-99 %] 91 % (09/10 0418) Weight:  [90.2 kg] 90.2 kg (09/10 0418) Last BM Date : 09/19/21 General:    white female in NAD Abdomen:  Soft, nontender and nondistended.  Neurologic:  Alert and oriented,  grossly normal neurologically. Psych:  Cooperative. Normal mood and affect.  Intake/Output from previous day: 09/09 0701 - 09/10 0700 In: 800 [I.V.:800] Out: -  Intake/Output this shift: No intake/output data recorded.  Lab Results: Recent Labs    09/18/21 1445 09/18/21 1445 09/18/21 2145 09/19/21 0505 09/19/21 1844 09/20/21 0537  WBC 8.4  --  9.7  --   --  8.3  HGB 10.3*   < > 10.4* 9.6* 9.1* 8.5*  HCT 31.9*  --  32.9* 29.5* 29.2* 27.2*  PLT 268  --  289  --   --  230   < > = values in this interval not displayed.   BMET Recent Labs    09/18/21 2145 09/19/21 0505 09/20/21 0537  NA 139 138 141  K 4.1 3.8 4.1  CL 103 101 105  CO2 '23 27 29  '$ GLUCOSE 120* 111* 129*  BUN 30* 30* 14  CREATININE 1.09* 0.92 0.93  CALCIUM 10.4* 9.5 9.1   LFT Recent Labs    09/18/21 1445  PROT 7.6  ALBUMIN 4.6  AST 17  ALT 17  ALKPHOS 52  BILITOT 0.4   PT/INR Recent Labs    09/18/21 2145  LABPROT 13.4  INR 1.0    Studies/Results: No results found.     Assessment / Plan:    76 y/o female admitted with the following:  Upper GI bleed - secondary to duodenal dieulafoy lesion Anemia Jehovah's witness   She has a history of gastric ulcer and duodenal dieulafoy lesion  02/2019, with iron deficiency, who presented with recurrent upper GI bleeding.  Enteroscopy done yesterday.  She had an actively bleeding dieulafoy lesion in her distal duodenum.  This was treated with APC and 2 hemostasis clips with good result.  She is tolerating clear liquid diet yesterday, no further bowel movements or melena.  Her hemoglobin has drifted down slightly but could be equilibration after her bleeding, hydration etc.  Her BUN has down trended nicely which would indicate no further bleeding.  I think okay to give her a regular diet today but would watch in the hospital to make sure no further bleeding.  If she does well today on a diet and hemoglobin is stable tomorrow morning, I think can be discharged home in the morning.  If she has any recurrent bleeding in the interim please let us know.  All questions answered, she agrees with the plan.  Plan: - advance to regular diet - monitor for recurrent bleeding today - minimize blood draws (Jehovah's witness) - check Hgb in the AM. If that is stable and no further bleeding I think okay to discharge tomorrow AM  Call with questions or changes in her status in  the interim, otherwise will sign off.   Jolly Mango, MD [facility]

## 2021-09-21 DIAGNOSIS — D509 Iron deficiency anemia, unspecified: Secondary | ICD-10-CM

## 2021-09-21 DIAGNOSIS — I10 Essential (primary) hypertension: Secondary | ICD-10-CM | POA: Diagnosis not present

## 2021-09-21 DIAGNOSIS — K922 Gastrointestinal hemorrhage, unspecified: Secondary | ICD-10-CM | POA: Diagnosis not present

## 2021-09-21 DIAGNOSIS — N3000 Acute cystitis without hematuria: Secondary | ICD-10-CM

## 2021-09-21 DIAGNOSIS — K3182 Dieulafoy lesion (hemorrhagic) of stomach and duodenum: Secondary | ICD-10-CM | POA: Diagnosis not present

## 2021-09-21 DIAGNOSIS — E118 Type 2 diabetes mellitus with unspecified complications: Secondary | ICD-10-CM | POA: Diagnosis not present

## 2021-09-21 LAB — CBC WITH DIFFERENTIAL/PLATELET
Abs Immature Granulocytes: 0.04 10*3/uL (ref 0.00–0.07)
Basophils Absolute: 0 10*3/uL (ref 0.0–0.1)
Basophils Relative: 1 %
Eosinophils Absolute: 0.2 10*3/uL (ref 0.0–0.5)
Eosinophils Relative: 4 %
HCT: 27.7 % — ABNORMAL LOW (ref 36.0–46.0)
Hemoglobin: 8.6 g/dL — ABNORMAL LOW (ref 12.0–15.0)
Immature Granulocytes: 1 %
Lymphocytes Relative: 32 %
Lymphs Abs: 2.2 10*3/uL (ref 0.7–4.0)
MCH: 28.7 pg (ref 26.0–34.0)
MCHC: 31 g/dL (ref 30.0–36.0)
MCV: 92.3 fL (ref 80.0–100.0)
Monocytes Absolute: 0.7 10*3/uL (ref 0.1–1.0)
Monocytes Relative: 10 %
Neutro Abs: 3.6 10*3/uL (ref 1.7–7.7)
Neutrophils Relative %: 52 %
Platelets: 225 10*3/uL (ref 150–400)
RBC: 3 MIL/uL — ABNORMAL LOW (ref 3.87–5.11)
RDW: 14.6 % (ref 11.5–15.5)
WBC: 6.8 10*3/uL (ref 4.0–10.5)
nRBC: 0 % (ref 0.0–0.2)

## 2021-09-21 LAB — BASIC METABOLIC PANEL
Anion gap: 6 (ref 5–15)
BUN: 15 mg/dL (ref 8–23)
CO2: 28 mmol/L (ref 22–32)
Calcium: 9.2 mg/dL (ref 8.9–10.3)
Chloride: 111 mmol/L (ref 98–111)
Creatinine, Ser: 0.86 mg/dL (ref 0.44–1.00)
GFR, Estimated: >60 mL/min (ref >60)
Glucose, Bld: 135 mg/dL — ABNORMAL HIGH (ref 70–99)
Potassium: 4.1 mmol/L (ref 3.5–5.1)
Sodium: 145 mmol/L (ref 135–145)

## 2021-09-21 LAB — URINALYSIS, ROUTINE W REFLEX MICROSCOPIC
Bilirubin Urine: NEGATIVE
Glucose, UA: NEGATIVE mg/dL
Hgb urine dipstick: NEGATIVE
Ketones, ur: NEGATIVE mg/dL
Nitrite: NEGATIVE
Protein, ur: NEGATIVE mg/dL
Specific Gravity, Urine: 1.008 (ref 1.005–1.030)
WBC, UA: >50 WBC/hpf — ABNORMAL HIGH (ref 0–5)
pH: 6 (ref 5.0–8.0)

## 2021-09-21 LAB — GLUCOSE, CAPILLARY
Glucose-Capillary: 132 mg/dL — ABNORMAL HIGH (ref 70–99)
Glucose-Capillary: 120 mg/dL — ABNORMAL HIGH (ref 70–99)
Glucose-Capillary: 150 mg/dL — ABNORMAL HIGH (ref 70–99)
Glucose-Capillary: 167 mg/dL — ABNORMAL HIGH (ref 70–99)

## 2021-09-21 LAB — MAGNESIUM: Magnesium: 1.6 mg/dL — ABNORMAL LOW (ref 1.7–2.4)

## 2021-09-21 MED ORDER — VENLAFAXINE HCL ER 37.5 MG PO CP24
37.5000 mg | ORAL_CAPSULE | Freq: Every morning | ORAL | Status: DC
  Filled 2021-09-21: qty 1

## 2021-09-21 MED ORDER — VENLAFAXINE HCL ER 37.5 MG PO CP24: 37.5000 mg | ORAL_CAPSULE | Freq: Every morning | ORAL | Status: DC

## 2021-09-21 MED ORDER — SULFAMETHOXAZOLE-TRIMETHOPRIM 800-160 MG PO TABS
1.0000 | ORAL_TABLET | Freq: Two times a day (BID) | ORAL | Status: DC
  Administered 2021-09-21 (×2): 1 via ORAL
  Filled 2021-09-21 (×3): qty 1

## 2021-09-21 MED ORDER — MAGNESIUM SULFATE 2 GM/50ML IV SOLN
2.0000 g | Freq: Once | INTRAVENOUS | Status: AC
  Administered 2021-09-21: 2 g via INTRAVENOUS
  Filled 2021-09-21: qty 50

## 2021-09-21 MED ORDER — SULFAMETHOXAZOLE-TRIMETHOPRIM 800-160 MG PO TABS
1.0000 | ORAL_TABLET | Freq: Two times a day (BID) | ORAL | 0 refills | Status: AC
Start: 2021-09-21 — End: 2021-09-24

## 2021-09-21 NOTE — TOC Transition Note (Signed)
Transition of Care [facility]) - CM/SW Discharge Note   Patient Details  Name: Dana Nielsen MRN: [id number] Date of Birth: 07-31-1945  [facility] CM/SW Contact:  Vassie Moselle, LCSW Phone Number: 09/21/2021, 11:37 AM   Clinical Narrative:    [facility] consulted for PCP needs as pt is not satisfied with current PCP. Pt will have to schedule new PCP appointment on her own as pt has insurance and will be switching PCP's. No further TOC needs identified for this pt.    Final next level of care: Home/Self Care Barriers to Discharge: No Barriers Identified   Patient Goals and CMS Choice     Choice offered to / list presented to : NA  Discharge Placement                       Discharge Plan and Services In-house Referral: NA Discharge Planning Services: CM Consult            DME Arranged: N/A DME Agency: NA                  Social Determinants of Health (SDOH) Interventions     Readmission Risk Interventions    09/21/2021   11:30 AM  Readmission Risk Prevention Plan  Post Dischage Appt Complete  Medication Screening Complete  Transportation Screening Complete

## 2021-09-21 NOTE — Discharge Summary (Signed)
Physician Discharge Summary  Dana Nielsen ZMO:[id number] DOB: Jul 24, 1945 DOA: 09/18/2021  PCP: Orson Slick, MD  Admit date: 09/18/2021 Discharge date: 09/21/2021  Admitted From: Home Disposition: Home  Recommendations for Outpatient Follow-up:  Follow up with PCP in 1 week with repeat CBC/BMP Outpatient follow-up with GI Follow up in ED if symptoms worsen or new appear   Home Health: No Equipment/Devices: None  Discharge Condition: Stable CODE STATUS: Full Diet recommendation: Heart healthy/carb modified  Brief/Interim Summary: 76 y.o. female Jehovah's Witness with medical history significant for type 2 diabetes mellitus, hypothyroidism, hypertension, and iron deficiency anemia, history of Dieulafoy lesion and nonbleeding gastric ulcers in 2021 presented with melena.  On presentation, hemoglobin was 10.4, down from 11 on 09/10/2021 and fecal occult blood testing was positive.  She was started on IV Protonix.  GI was consulted.  She underwent EGD on 09/19/2021.  Subsequently she has tolerated diet and hemoglobin has remained stable.  GI has cleared the patient for discharge.  She will be discharged home today with outpatient follow-up with PCP and GI.  Discharge Diagnoses:   Possible upper GI bleeding presenting with melena Dieulafoy lesion with bleeding in distal duodenum -Presented with melena and hemoglobin of 10.4, down from 11 on 09/10/2021 with fecal occult blood testing positive.  Currently on IV Protonix. -Status post EGD on 09/19/2021: Dieulafoy lesion with bleeding in distal duodenum seen: Treated with APC and clips.   -Currently tolerating diet.  Hemoglobin this morning is 8.6 -GI has cleared her for discharge.  Discharge patient home today with outpatient follow-up with GI and PCP.  Outpatient follow-up of CBC.  Continue daily PPI on discharge.   Hypomagnesemia -Continue replacement on discharge.  History of iron deficiency anemia -Patient states that she was supposed  to get iron transfusion by Dr. Lorenso Courier as an outpatient and is requesting iron transfusion while she is in the hospital.  Patient received IV iron in the hospital.  Outpatient follow-up with Dr. Lorenso Courier.    Diabetes mellitus type 2 -A1c 6.6.  Continue CBGs with SSI   Hypertension -Blood pressure currently stable.  Resume losartan.  Keep Lasix on hold.    Obesity -Outpatient follow-up  Discharge Instructions  Discharge Instructions     Ambulatory referral to Gastroenterology   Complete by: As directed    Hospital followup   What is the reason for referral?: Other   Diet - low sodium heart healthy   Complete by: As directed    Diet Carb Modified   Complete by: As directed    Increase activity slowly   Complete by: As directed       Allergies as of 09/21/2021       Reactions   Lidocaine Shortness Of Breath   Macrobid [nitrofurantoin] Other (See Comments)   Headache Confusion  Dizziness Myalgias         Medication List     STOP taking these medications    amoxicillin-clavulanate 875-125 MG tablet Commonly known as: AUGMENTIN   furosemide 20 MG tablet Commonly known as: LASIX   pantoprazole 40 MG tablet Commonly known as: Protonix       TAKE these medications    cetirizine 10 MG tablet Commonly known as: ZYRTEC Take 10 mg by mouth at bedtime.   losartan 25 MG tablet Commonly known as: COZAAR Take 25 mg by mouth in the morning.   magnesium oxide 400 (240 Mg) MG tablet Commonly known as: MAG-OX Take 1 tablet (400 mg total) by mouth daily. What  changed: when to take this   metFORMIN 1000 MG tablet Commonly known as: GLUCOPHAGE Take 1,000 mg by mouth 2 (two) times daily with a meal.   omeprazole 40 MG capsule Commonly known as: PRILOSEC Take 40 mg by mouth in the morning.   One-A-Day Womens 50+ Tabs Take 1 tablet by mouth in the morning.   Hair/Skin/Nails Caps Take 1 capsule by mouth in the morning.   simvastatin 40 MG tablet Commonly known  as: ZOCOR Take 1 tablet (40 mg total) by mouth daily at 6 PM. What changed: when to take this   sulfamethoxazole-trimethoprim 800-160 MG tablet Commonly known as: BACTRIM DS Take 1 tablet by mouth every 12 (twelve) hours for 3 days.   Synthroid 100 MCG tablet Generic drug: levothyroxine Take 100 mcg by mouth daily before breakfast.   traZODone 100 MG tablet Commonly known as: DESYREL TAKE 1 TABLET BY MOUTH AT BEDTIME   triamcinolone cream 0.1 % Commonly known as: KENALOG Apply 1 Application topically daily as needed (itching, irritation).   venlafaxine XR 37.5 MG 24 hr capsule Commonly known as: EFFEXOR-XR Take 37.5 mg by mouth in the morning.   Victoza 18 MG/3ML Sopn Generic drug: liraglutide Inject 1.2 mg into the skin in the morning.   VITAMIN B-12 PO Take 1 tablet by mouth in the morning.        Follow-up Information     Orson Slick, MD. Schedule an appointment as soon as possible for a visit in 1 week(s).   Specialty: Hematology and Oncology Contact information: [address] [place] [address] [phone]                Allergies  Allergen Reactions   Lidocaine Shortness Of Breath   Macrobid [Nitrofurantoin] Other (See Comments)    Headache Confusion  Dizziness Myalgias     Consultations: GI   Procedures/Studies: No results found.    Subjective: Patient seen and examined at bedside.  Feels okay to go home today.  Denies any overnight fever, nausea, vomiting, shortness of breath.  Discharge Exam: Vitals:   09/20/21 2112 09/21/21 0553  BP: (!) 112/57 123/60  Pulse: 87 78  Resp: 20 16  Temp: 98.6 F (37 C) 97.9 F (36.6 C)  SpO2: 98% 96%    General: Pt is alert, awake, not in acute distress.  On room air.  Elderly female lying in bed. Cardiovascular: rate controlled, S1/S2 + Respiratory: bilateral decreased breath sounds at bases Abdominal: Soft, obese, NT, ND, bowel sounds + Extremities: Trace lower extremity  edema; no cyanosis    The results of significant diagnostics from this hospitalization (including imaging, microbiology, ancillary and laboratory) are listed below for reference.     Microbiology: No results found for this or any previous visit (from the past 240 hour(s)).   Labs: BNP (last 3 results) No results for input(s): "BNP" in the last 8760 hours. Basic Metabolic Panel: Recent Labs  Lab 09/18/21 1445 09/18/21 2145 09/19/21 0505 09/20/21 0537 09/21/21 0506  NA 138 139 138 141 145  K 4.0 4.1 3.8 4.1 4.1  CL 100 103 101 105 111  CO2 '28 23 27 29 28  '$ GLUCOSE 191* 120* 111* 129* 135*  BUN 23 30* 30* 14 15  CREATININE 1.02* 1.09* 0.92 0.93 0.86  CALCIUM 10.1 10.4* 9.5 9.1 9.2  MG  --   --  1.3* 1.5* 1.6*   Liver Function Tests: Recent Labs  Lab 09/18/21 1445  AST 17  ALT 17  ALKPHOS 52  BILITOT 0.4  PROT 7.6  ALBUMIN 4.6   No results for input(s): "LIPASE", "AMYLASE" in the last 168 hours. No results for input(s): "AMMONIA" in the last 168 hours. CBC: Recent Labs  Lab 09/18/21 1445 09/18/21 2145 09/19/21 0505 09/19/21 1844 09/20/21 0537 09/21/21 0506  WBC 8.4 9.7  --   --  8.3 6.8  NEUTROABS 5.4  --   --   --  5.1 3.6  HGB 10.3* 10.4* 9.6* 9.1* 8.5* 8.6*  HCT 31.9* 32.9* 29.5* 29.2* 27.2* 27.7*  MCV 90.1 90.6  --   --  91.6 92.3  PLT 268 289  --   --  230 225   Cardiac Enzymes: No results for input(s): "CKTOTAL", "CKMB", "CKMBINDEX", "TROPONINI" in the last 168 hours. BNP: Invalid input(s): "POCBNP" CBG: Recent Labs  Lab 09/20/21 1155 09/20/21 1608 09/20/21 2115 09/21/21 0601 09/21/21 0728  GLUCAP 210* 159* 134* 132* 120*   D-Dimer No results for input(s): "DDIMER" in the last 72 hours. Hgb A1c Recent Labs    09/19/21 0501  HGBA1C 6.6*   Lipid Profile No results for input(s): "CHOL", "HDL", "LDLCALC", "TRIG", "CHOLHDL", "LDLDIRECT" in the last 72 hours. Thyroid function studies No results for input(s): "TSH", "T4TOTAL", "T3FREE",  "THYROIDAB" in the last 72 hours.  Invalid input(s): "FREET3" Anemia work up Recent Labs    09/18/21 1444 09/18/21 1445  FERRITIN  --  181  TIBC  --  399  IRON  --  31  RETICCTPCT 3.0  --    Urinalysis    Component Value Date/Time   COLORURINE YELLOW 09/21/2021 0112   APPEARANCEUR CLOUDY (A) 09/21/2021 0112   LABSPEC 1.008 09/21/2021 0112   PHURINE 6.0 09/21/2021 0112   GLUCOSEU NEGATIVE 09/21/2021 0112   GLUCOSEU NEGATIVE 12/15/2018 1058   HGBUR NEGATIVE 09/21/2021 0112   BILIRUBINUR NEGATIVE 09/21/2021 0112   KETONESUR NEGATIVE 09/21/2021 0112   PROTEINUR NEGATIVE 09/21/2021 0112   UROBILINOGEN 0.2 04/02/2021 1538   NITRITE NEGATIVE 09/21/2021 0112   LEUKOCYTESUR LARGE (A) 09/21/2021 0112   Sepsis Labs Recent Labs  Lab 09/18/21 1445 09/18/21 2145 09/20/21 0537 09/21/21 0506  WBC 8.4 9.7 8.3 6.8   Microbiology No results found for this or any previous visit (from the past 240 hour(s)).   Time coordinating discharge: 35 minutes  SIGNED:   Aline August, MD  [facility] 09/21/2021, 10:33 AM

## 2021-09-21 NOTE — Progress Notes (Signed)
Pt reported feeling "discombobulated" and "like a wet wash rag", confirmed feeling a bit weak. Denied dizziness, vital signs WNL, lung sounds normal, heart sounds normal other than murmur which is baseline for her, neuro checks intact although she reports intermittent hearing abnormalities. EKG WNL. NAD, but a bit anxious.   Dr Starla Link notified and updated on assessment findings; recommended holding discharge to monitor pt for a couple of hours.  Pt's son here at 8 to take pt home; she stated she was feeling well enough to go home and her symptoms had improved since onset.

## 2021-09-21 NOTE — Anesthesia Postprocedure Evaluation (Signed)
Anesthesia Post Note  Patient: Dana Nielsen  Procedure(s) Performed: ENTEROSCOPY SUBMUCOSAL TATTOO INJECTION HOT HEMOSTASIS (ARGON PLASMA COAGULATION/BICAP) HEMOSTASIS CLIP PLACEMENT     Patient location during evaluation: Endoscopy Anesthesia Type: MAC Level of consciousness: awake and alert Pain management: pain level controlled Vital Signs Assessment: post-procedure vital signs reviewed and stable Respiratory status: spontaneous breathing, nonlabored ventilation, respiratory function stable and patient connected to nasal cannula oxygen Cardiovascular status: stable and blood pressure returned to baseline Postop Assessment: no apparent nausea or vomiting Anesthetic complications: no   No notable events documented.  Last Vitals:  Vitals:   09/20/21 2112 09/21/21 0553  BP: (!) 112/57 123/60  Pulse: 87 78  Resp: 20 16  Temp: 37 C 36.6 C  SpO2: 98% 96%    Last Pain:  Vitals:   09/21/21 0553  TempSrc: Oral  PainSc:                  [place]

## 2021-09-21 NOTE — Progress Notes (Signed)
Patient complaining of dysuria and urinary urgency and frequency, states she feels like she has a UTI.   She is not febrile or septic. She has allergy to nitrofurantoin listed. Plan to send UA and culture and start 3-day course of Bactrim.

## 2021-09-21 NOTE — Final Progress Note (Signed)
Pt discharged home, transported by son Jeneen Rinks via private vehicle.  IV removed without complication. AVS reviewed, Rx reviewed.  No questions or concerns at time of departure.  Sent home with all belongings.

## 2021-09-22 ENCOUNTER — Other Ambulatory Visit: Payer: Self-pay | Admitting: Hematology and Oncology

## 2021-09-22 ENCOUNTER — Telehealth: Payer: Self-pay | Admitting: Pharmacy Technician

## 2021-09-22 DIAGNOSIS — D500 Iron deficiency anemia secondary to blood loss (chronic): Secondary | ICD-10-CM

## 2021-09-22 NOTE — Telephone Encounter (Signed)
Dr. Lorenso Courier,  Monoferric is a non-preferred medication and will likely be denied due to patient has not tried and or failed step therapy. Would you like to try venofer?  Phone: [phone] Ref: [id number]

## 2021-09-22 NOTE — Telephone Encounter (Signed)
Dr. Lorenso Courier, I will try for feraheme and f/u once I have a response. Thanks Maudie Mercury

## 2021-09-23 ENCOUNTER — Other Ambulatory Visit: Payer: Self-pay | Admitting: Pharmacy Technician

## 2021-09-23 LAB — URINE CULTURE: Culture: >=100000 — AB

## 2021-09-23 NOTE — Telephone Encounter (Signed)
Dr. Lorenso Courier, Shirlean Kelly has been approved.   Auth Submission: APPROVED Payer: [employer] Medication & CPT/J Code(s) submitted: Feraheme (ferumoxytol) [id number] Route of submission (phone, fax, portal):  Phone # [phone] Fax [phone] Auth type: Buy/Bill Units/visits requested: x2 doses Reference number: [id number] Approval from: 09/22/21 to 01/10/22   Patient will be scheduled as soon as possible

## 2021-09-24 ENCOUNTER — Inpatient Hospital Stay: Payer: Medicare HMO

## 2021-09-24 ENCOUNTER — Other Ambulatory Visit: Payer: Self-pay

## 2021-09-24 ENCOUNTER — Other Ambulatory Visit: Payer: Self-pay | Admitting: *Deleted

## 2021-09-24 ENCOUNTER — Inpatient Hospital Stay (HOSPITAL_BASED_OUTPATIENT_CLINIC_OR_DEPARTMENT_OTHER): Payer: Medicare HMO | Admitting: Physician Assistant

## 2021-09-24 ENCOUNTER — Telehealth: Payer: Self-pay | Admitting: *Deleted

## 2021-09-24 VITALS — BP 106/75 | HR 85 | Temp 98.9°F | Resp 18

## 2021-09-24 DIAGNOSIS — D500 Iron deficiency anemia secondary to blood loss (chronic): Secondary | ICD-10-CM

## 2021-09-24 DIAGNOSIS — Z807 Family history of other malignant neoplasms of lymphoid, hematopoietic and related tissues: Secondary | ICD-10-CM | POA: Diagnosis not present

## 2021-09-24 DIAGNOSIS — Z800 Family history of malignant neoplasm of digestive organs: Secondary | ICD-10-CM | POA: Diagnosis not present

## 2021-09-24 DIAGNOSIS — Z806 Family history of leukemia: Secondary | ICD-10-CM | POA: Diagnosis not present

## 2021-09-24 DIAGNOSIS — K5909 Other constipation: Secondary | ICD-10-CM | POA: Diagnosis not present

## 2021-09-24 DIAGNOSIS — M79602 Pain in left arm: Secondary | ICD-10-CM

## 2021-09-24 DIAGNOSIS — Z801 Family history of malignant neoplasm of trachea, bronchus and lung: Secondary | ICD-10-CM | POA: Diagnosis not present

## 2021-09-24 DIAGNOSIS — E119 Type 2 diabetes mellitus without complications: Secondary | ICD-10-CM | POA: Diagnosis not present

## 2021-09-24 LAB — CMP (CANCER CENTER ONLY)
ALT: 27 U/L (ref 0–44)
AST: 21 U/L (ref 15–41)
Albumin: 4.1 g/dL (ref 3.5–5.0)
Alkaline Phosphatase: 56 U/L (ref 38–126)
Anion gap: 6 (ref 5–15)
BUN: 12 mg/dL (ref 8–23)
CO2: 26 mmol/L (ref 22–32)
Calcium: 9.9 mg/dL (ref 8.9–10.3)
Chloride: 107 mmol/L (ref 98–111)
Creatinine: 0.91 mg/dL (ref 0.44–1.00)
GFR, Estimated: >60 mL/min (ref >60)
Glucose, Bld: 132 mg/dL — ABNORMAL HIGH (ref 70–99)
Potassium: 4.1 mmol/L (ref 3.5–5.1)
Sodium: 139 mmol/L (ref 135–145)
Total Bilirubin: 0.3 mg/dL (ref 0.3–1.2)
Total Protein: 7 g/dL (ref 6.5–8.1)

## 2021-09-24 LAB — CBC WITH DIFFERENTIAL (CANCER CENTER ONLY)
Abs Immature Granulocytes: 0.07 10*3/uL (ref 0.00–0.07)
Basophils Absolute: 0.1 10*3/uL (ref 0.0–0.1)
Basophils Relative: 1 %
Eosinophils Absolute: 0.2 10*3/uL (ref 0.0–0.5)
Eosinophils Relative: 4 %
HCT: 30.3 % — ABNORMAL LOW (ref 36.0–46.0)
Hemoglobin: 9.6 g/dL — ABNORMAL LOW (ref 12.0–15.0)
Immature Granulocytes: 1 %
Lymphocytes Relative: 24 %
Lymphs Abs: 1.5 10*3/uL (ref 0.7–4.0)
MCH: 28.8 pg (ref 26.0–34.0)
MCHC: 31.7 g/dL (ref 30.0–36.0)
MCV: 91 fL (ref 80.0–100.0)
Monocytes Absolute: 0.6 10*3/uL (ref 0.1–1.0)
Monocytes Relative: 9 %
Neutro Abs: 3.8 10*3/uL (ref 1.7–7.7)
Neutrophils Relative %: 61 %
Platelet Count: 298 10*3/uL (ref 150–400)
RBC: 3.33 MIL/uL — ABNORMAL LOW (ref 3.87–5.11)
RDW: 15.3 % (ref 11.5–15.5)
WBC Count: 6.2 10*3/uL (ref 4.0–10.5)
nRBC: 0 % (ref 0.0–0.2)

## 2021-09-24 NOTE — Progress Notes (Signed)
Symptom Management Consult note Dana Nielsen    Patient Care Team: Orson Slick, MD as PCP - General (Hematology and Oncology)    Name of the patient: Dana Nielsen  [id number]  [birth year]   Date of visit: 09/24/2021   Chief Complaint/Reason for visit: arm pain   ASSESSMENT & PLAN: Patient is a 76 y.o. female  with heme/onc history of iron deficiency anemia secondary to GI bleeding followed by Dr. Lorenso Courier.  I have viewed most recent oncology note and lab work.    #) Iron deficiency anemia  -Patient was here today for labs and hemoglobin is stable at 9.6. - Next appointment with heme/onc is 10/07/21   #) Left arm pain -Patient is well-appearing.  Left upper extremity is neurovascularly intact on exam.  She has an area of swelling on her left forearm that is tender to the touch around recent IV site in the left [facility].  There is no fluctuance or signs of gross abscess. -Concern for superficial thrombophlebitis, will obtain vascular ultrasound to rule out possibility of DVT.  Unfortunately we are unable to get have the ultrasound performed until tomorrow as there are no available appointments at this time.  Patient is stable to have ultrasound performed tomorrow morning.  I discussed strict precautions should any symptoms worsen during the night that would warrant emergent ED evaluation. -PAtient and son are agreeable with plan. Dr. Lorenso Courier will follow up on [place] results tomorrow.       Heme/Onc History: Oncology History   No history exists.      Interval history-: Dana Nielsen is a 76 y.o. female with oncologic history as above presenting to [facility] today with chief complaint of left arm pain and swelling x2 days.  Patient was recently in the hospital from 09/08-09/11 for upper GI bleed.  2 days ago she noticed an area of swelling on her left forearm around where her IV had been.  She states it has slowly grown in size.  It is very tender to the touch and she  describes the pain as a throbbing sensation.  Pain does not radiate.  She has not taken anything over-the-counter for pain prior to arrival.  She rates the pain currently 3 out of 10 in severity.  She also mentioned that it was warm to the touch.  She denies any fall or injury to her arm.  Patient is not on blood thinners and denies any history of blood clots.  She denies any fever, chills, shortness of breath, chest pain, numbness, tingling or weakness.    ROS  All other systems are reviewed and are negative for acute change except as noted in the HPI.    Allergies  Allergen Reactions   Lidocaine Shortness Of Breath   Macrobid [Nitrofurantoin] Other (See Comments)    Headache Confusion  Dizziness Myalgias      Past Medical History:  Diagnosis Date   Diabetes mellitus type 2, diet-controlled ([place])    Family history of colon cancer 12/09/2020   Family history of lung cancer 12/09/2020   Family history of multiple myeloma 12/09/2020   HLD (hyperlipidemia)    Hypothyroidism    Iron deficiency anemia      Past Surgical History:  Procedure Laterality Date   arthroscopic knee     BIOPSY  03/08/2019   Procedure: BIOPSY;  Surgeon: Lavena Bullion, DO;  Location: [facility] END[facility]COPY;  Service: Gastroenterology;;   C sections     x 7  COLONOSCOPY  2019   x2 At the [facility] and in [facility]   ENTEROSCOPY N/A 03/08/2019   Procedure: ENTEROSCOPY;  Surgeon: Lavena Bullion, DO;  Location: [facility];  Service: Gastroenterology;  Laterality: N/A;   ENTEROSCOPY N/A 09/19/2021   Procedure: ENTEROSCOPY;  Surgeon: Yetta Flock, MD;  Location: [facility];  Service: Gastroenterology;  Laterality: N/A;   HEMOSTASIS CLIP PLACEMENT  03/08/2019   Procedure: HEMOSTASIS CLIP PLACEMENT;  Surgeon: Lavena Bullion, DO;  Location: [facility];  Service: Gastroenterology;;   HEMOSTASIS CLIP PLACEMENT  09/19/2021   Procedure: HEMOSTASIS CLIP PLACEMENT;  Surgeon: Yetta Flock, MD;   Location: [facility];  Service: Gastroenterology;;   HOT HEMOSTASIS N/A 09/19/2021   Procedure: HOT HEMOSTASIS (ARGON PLASMA COAGULATION/BICAP);  Surgeon: Yetta Flock, MD;  Location: [facility];  Service: Gastroenterology;  Laterality: N/A;   SUBMUCOSAL TATTOO INJECTION  03/08/2019   Procedure: SUBMUCOSAL TATTOO INJECTION;  Surgeon: Lavena Bullion, DO;  Location: [facility];  Service: Gastroenterology;;   SUBMUCOSAL TATTOO INJECTION  09/19/2021   Procedure: SUBMUCOSAL TATTOO INJECTION;  Surgeon: Yetta Flock, MD;  Location: [facility];  Service: Gastroenterology;;   TONSILLECTOMY     WISDOM TOOTH EXTRACTION     WRIST SURGERY      Social History   Socioeconomic History   Marital status: Widowed    Spouse name: Not on file   Number of children: 9   Years of education: Not on file   Highest education level: Not on file  Occupational History   Not on file  Tobacco Use   Smoking status: Never   Smokeless tobacco: Never  Vaping Use   Vaping Use: Never used  Substance and Sexual Activity   Alcohol use: Not Currently   Drug use: Never   Sexual activity: Not on file  Other Topics Concern   Not on file  Social History Narrative   Not on file   Social Determinants of Health   Financial Resource Strain: Not on file  Food Insecurity: Not on file  Transportation Needs: Not on file  Physical Activity: Not on file  Stress: Not on file  Social Connections: Not on file  Intimate Partner Violence: Not on file    Family History  Problem Relation Age of Onset   Multiple myeloma Mother 80   Heart attack Father    Lung cancer Sister 37       smoking hx   Leukemia Paternal Aunt        dx before 62   Cancer Paternal Aunt        unknown type; dx after 27   Colon cancer Paternal Aunt        dx after 50   Head & neck cancer Paternal Uncle        dx before 89   Cancer Paternal Uncle        unknown type; dx after 48   Cancer Paternal Uncle        color or  gastric; dx after 50     Current Outpatient Medications:    cetirizine (ZYRTEC) 10 MG tablet, Take 10 mg by mouth at bedtime., Disp: , Rfl:    Cyanocobalamin (VITAMIN B-12 PO), Take 1 tablet by mouth in the morning., Disp: , Rfl:    losartan (COZAAR) 25 MG tablet, Take 25 mg by mouth in the morning., Disp: , Rfl:    magnesium oxide (MAG-OX) 400 (240 Mg) MG tablet, Take 1 tablet (400 mg total) by  mouth daily. (Patient taking differently: Take 400 mg by mouth in the morning.), Disp: 30 tablet, Rfl: 1   metFORMIN (GLUCOPHAGE) 1000 MG tablet, Take 1,000 mg by mouth 2 (two) times daily with a meal., Disp: , Rfl:    Multiple Vitamins-Minerals (HAIR/SKIN/NAILS) CAPS, Take 1 capsule by mouth in the morning., Disp: , Rfl:    Multiple Vitamins-Minerals (ONE-A-DAY WOMENS 50+) TABS, Take 1 tablet by mouth in the morning., Disp: , Rfl:    omeprazole (PRILOSEC) 40 MG capsule, Take 40 mg by mouth in the morning., Disp: , Rfl:    simvastatin (ZOCOR) 40 MG tablet, Take 1 tablet (40 mg total) by mouth daily at 6 PM. (Patient taking differently: Take 40 mg by mouth at bedtime.), Disp: 90 tablet, Rfl: 3   sulfamethoxazole-trimethoprim (BACTRIM DS) 800-160 MG tablet, Take 1 tablet by mouth every 12 (twelve) hours for 3 days., Disp: 6 tablet, Rfl: 0   SYNTHROID 100 MCG tablet, Take 100 mcg by mouth daily before breakfast., Disp: , Rfl:    traZODone (DESYREL) 100 MG tablet, TAKE 1 TABLET BY MOUTH AT BEDTIME (Patient taking differently: Take 100 mg by mouth at bedtime.), Disp: 90 tablet, Rfl: 0   triamcinolone cream (KENALOG) 0.1 %, Apply 1 Application topically daily as needed (itching, irritation)., Disp: , Rfl:    venlafaxine XR (EFFEXOR-XR) 37.5 MG 24 hr capsule, Take 37.5 mg by mouth in the morning., Disp: , Rfl:    VICTOZA 18 MG/3ML SOPN, Inject 1.2 mg into the skin in the morning., Disp: , Rfl:   PHYSICAL EXAM: ECOG FS:1 - Symptomatic but completely ambulatory    Vitals:   09/24/21 1555  BP: 106/75   Pulse: 85  Resp: 18  Temp: 98.9 F (37.2 C)  TempSrc: Oral  SpO2: 99%   Physical Exam Vitals and nursing note reviewed.  Constitutional:      Appearance: She is well-developed. She is not ill-appearing or toxic-appearing.  HENT:     Head: Normocephalic.     Nose: Nose normal.  Eyes:     Conjunctiva/sclera: Conjunctivae normal.  Neck:     Vascular: No JVD.  Cardiovascular:     Rate and Rhythm: Normal rate and regular rhythm.     Pulses: Normal pulses.          Radial pulses are 2+ on the right side and 2+ on the left side.     Heart sounds: Normal heart sounds.  Pulmonary:     Effort: Pulmonary effort is normal.     Breath sounds: Normal breath sounds.  Abdominal:     General: There is no distension.  Musculoskeletal:     Cervical back: Normal range of motion.     Right lower leg: No edema.     Left lower leg: No edema.     Comments: 4 x 2 cm area of swelling on left forearm near medial humeral epicondyle with overlying erythema and warmth.  Range of motion of left upper extremity.  Left upper extremity is neurovascularly intact distally.  Skin:    General: Skin is warm and dry.     Capillary Refill: Capillary refill takes less than 2 seconds.  Neurological:     Mental Status: She is oriented to person, place, and time.        LABORATORY DATA: I have reviewed the data as listed    Latest Ref Rng & Units 09/24/2021    2:33 PM 09/21/2021    5:06 AM 09/20/2021    5:37 AM  CBC  WBC 4.0 - 10.5 K/uL 6.2  6.8  8.3   Hemoglobin 12.0 - 15.0 g/dL 9.6  8.6  8.5   Hematocrit 36.0 - 46.0 % 30.3  27.7  27.2   Platelets 150 - 400 K/uL 298  225  230         Latest Ref Rng & Units 09/24/2021    2:33 PM 09/21/2021    5:06 AM 09/20/2021    5:37 AM  CMP  Glucose 70 - 99 mg/dL 132  135  129   BUN 8 - 23 mg/dL '12  15  14   ' Creatinine 0.44 - 1.00 mg/dL 0.91  0.86  0.93   Sodium 135 - 145 mmol/L 139  145  141   Potassium 3.5 - 5.1 mmol/L 4.1  4.1  4.1   Chloride 98 - 111  mmol/L 107  111  105   CO2 22 - 32 mmol/L '26  28  29   ' Calcium 8.9 - 10.3 mg/dL 9.9  9.2  9.1   Total Protein 6.5 - 8.1 g/dL 7.0     Total Bilirubin 0.3 - 1.2 mg/dL 0.3     Alkaline Phos 38 - 126 U/L 56     AST 15 - 41 U/L 21     ALT 0 - 44 U/L 27          RADIOGRAPHIC STUDIES (from last 24 hours if applicable) I have personally reviewed the radiological images as listed and agreed with the findings in the report. No results found.      Visit Diagnosis: 1. Left arm pain   2. Iron deficiency anemia due to chronic blood loss      No orders of the defined types were placed in this encounter.   All questions were answered. The patient knows to call the clinic with any problems, questions or concerns. No barriers to learning was detected.  I have spent a total of 20 minutes minutes of face-to-face and non-face-to-face time, preparing to see the patient, obtaining and/or reviewing separately obtained history, performing a medically appropriate examination, counseling and educating the patient, ordering tests, documenting clinical information in the electronic health record, and care coordination (communications with other health care professionals or caregivers).    Thank you for allowing me to participate in the care of this patient.    Barrie Folk, PA-C Department of Hematology/Oncology [facility] at [facility] Phone: [phone]  [phone]    09/24/2021 5:07 PM

## 2021-09-24 NOTE — Telephone Encounter (Signed)
Called to see pt in the lobby to review her CBC results with her.  Met with her and her son.  Advised that HGB is up to 9.6. she is very pleased with that. She also wanted to show me a site on her left arm that had an IV in it during her recent hospital stay. She states it is very painful. Assessed site on her left ACF. There is an area on the lateral aspect of her ACF that is reddish pink, warm to touch and swollen. It is very tender to the touch, approx. 4cm x 2cm. Discussed with Milinda Cave, PA in [facility]. Anda Kraft states she will see her in the [facility].  Scheduling message sent. Pt taken to room 25. Son is with pt.

## 2021-09-25 ENCOUNTER — Ambulatory Visit (HOSPITAL_COMMUNITY)
Admission: RE | Admit: 2021-09-25 | Discharge: 2021-09-25 | Disposition: A | Discharge status: Home / Self Care | Payer: Medicare HMO | Source: Ambulatory Visit | Attending: Physician Assistant | Admitting: Physician Assistant

## 2021-09-25 ENCOUNTER — Telehealth: Payer: Self-pay | Admitting: *Deleted

## 2021-09-25 ENCOUNTER — Telehealth: Payer: Self-pay

## 2021-09-25 ENCOUNTER — Other Ambulatory Visit: Payer: Medicare HMO

## 2021-09-25 DIAGNOSIS — M79602 Pain in left arm: Secondary | ICD-10-CM

## 2021-09-25 NOTE — Progress Notes (Signed)
Lower extremity venous has been completed.   Preliminary results in CV Proc.   [name] 09/25/2021 9:20 AM

## 2021-09-25 NOTE — Telephone Encounter (Signed)
Received a call from [facility] in the vascular lab stating that exam results this AM show superficial vein thrombosis involving the left cephalic vein  at the antecubital fossa.  Routed to Dr. Lorenso Courier and Sherol Dade PA

## 2021-09-25 NOTE — Telephone Encounter (Signed)
TCT patient regarding results of the [place] of her left arm.  Spoke with patient and advised she has a superficial blood clot in the cephalic vein-the vein she had an IV in while she was in the hospital.Advised that this can happen, it does not mean anything was done wrong. Advised to use warm compresses on the site  several times a day for the next few days and to take Tylenol as needed for pain/ discomfort. Advised that normally we have pt's take a NSAID but she  cannot due to recent GI bleed. Pt voiced understanding and will follow the above directions.

## 2021-09-30 ENCOUNTER — Other Ambulatory Visit: Payer: Self-pay

## 2021-09-30 ENCOUNTER — Telehealth: Payer: Self-pay | Admitting: *Deleted

## 2021-09-30 ENCOUNTER — Inpatient Hospital Stay: Payer: Medicare HMO

## 2021-09-30 DIAGNOSIS — D500 Iron deficiency anemia secondary to blood loss (chronic): Secondary | ICD-10-CM | POA: Diagnosis not present

## 2021-09-30 LAB — CBC WITH DIFFERENTIAL (CANCER CENTER ONLY)
Abs Immature Granulocytes: 0.02 10*3/uL (ref 0.00–0.07)
Basophils Absolute: 0.1 10*3/uL (ref 0.0–0.1)
Basophils Relative: 1 %
Eosinophils Absolute: 0.2 10*3/uL (ref 0.0–0.5)
Eosinophils Relative: 3 %
HCT: 31.4 % — ABNORMAL LOW (ref 36.0–46.0)
Hemoglobin: 10 g/dL — ABNORMAL LOW (ref 12.0–15.0)
Immature Granulocytes: 0 %
Lymphocytes Relative: 35 %
Lymphs Abs: 2.2 10*3/uL (ref 0.7–4.0)
MCH: 28.6 pg (ref 26.0–34.0)
MCHC: 31.8 g/dL (ref 30.0–36.0)
MCV: 89.7 fL (ref 80.0–100.0)
Monocytes Absolute: 0.4 10*3/uL (ref 0.1–1.0)
Monocytes Relative: 6 %
Neutro Abs: 3.5 10*3/uL (ref 1.7–7.7)
Neutrophils Relative %: 55 %
Platelet Count: 341 10*3/uL (ref 150–400)
RBC: 3.5 MIL/uL — ABNORMAL LOW (ref 3.87–5.11)
RDW: 14.8 % (ref 11.5–15.5)
WBC Count: 6.4 10*3/uL (ref 4.0–10.5)
nRBC: 0 % (ref 0.0–0.2)

## 2021-09-30 NOTE — Telephone Encounter (Signed)
TCT patient regarding lab results from today. Spoke with her and advised that her HGB is now up to 10.0. Advised she is having a steady increase in her HGB and that Dr. Lorenso Courier is pleased with that. She states she still feels very tired. No evidence of GI bleeding. Reminded pt that we will check her labs again on 10/05/21 and see her in clinic on 10/07/21. Advised to call if anything changed in the meantime. Pt voiced understanding to all of the above.

## 2021-09-30 NOTE — Telephone Encounter (Signed)
-----   Message from Orson Slick, MD sent at 09/30/2021  3:22 PM EDT ----- Regarding: P2 Please a call to inform her Hgb is 10.0. thank you  ----- Message ----- From: Interface, Lab In [place] Sent: 09/30/2021   2:57 PM EDT To: Orson Slick, MD

## 2021-10-02 ENCOUNTER — Encounter: Payer: Self-pay | Admitting: Hematology and Oncology

## 2021-10-05 ENCOUNTER — Inpatient Hospital Stay: Payer: Medicare HMO

## 2021-10-05 ENCOUNTER — Other Ambulatory Visit: Payer: Self-pay

## 2021-10-05 DIAGNOSIS — D500 Iron deficiency anemia secondary to blood loss (chronic): Secondary | ICD-10-CM | POA: Diagnosis not present

## 2021-10-05 LAB — CBC WITH DIFFERENTIAL (CANCER CENTER ONLY)
Abs Immature Granulocytes: 0.01 10*3/uL (ref 0.00–0.07)
Basophils Absolute: 0.1 10*3/uL (ref 0.0–0.1)
Basophils Relative: 1 %
Eosinophils Absolute: 0.2 10*3/uL (ref 0.0–0.5)
Eosinophils Relative: 3 %
HCT: 31.9 % — ABNORMAL LOW (ref 36.0–46.0)
Hemoglobin: 10.1 g/dL — ABNORMAL LOW (ref 12.0–15.0)
Immature Granulocytes: 0 %
Lymphocytes Relative: 44 %
Lymphs Abs: 2.5 10*3/uL (ref 0.7–4.0)
MCH: 28.6 pg (ref 26.0–34.0)
MCHC: 31.7 g/dL (ref 30.0–36.0)
MCV: 90.4 fL (ref 80.0–100.0)
Monocytes Absolute: 0.4 10*3/uL (ref 0.1–1.0)
Monocytes Relative: 8 %
Neutro Abs: 2.5 10*3/uL (ref 1.7–7.7)
Neutrophils Relative %: 44 %
Platelet Count: 274 10*3/uL (ref 150–400)
RBC: 3.53 MIL/uL — ABNORMAL LOW (ref 3.87–5.11)
RDW: 14.8 % (ref 11.5–15.5)
WBC Count: 5.6 10*3/uL (ref 4.0–10.5)
nRBC: 0 % (ref 0.0–0.2)

## 2021-10-07 ENCOUNTER — Inpatient Hospital Stay: Payer: Medicare HMO

## 2021-10-07 ENCOUNTER — Inpatient Hospital Stay (HOSPITAL_BASED_OUTPATIENT_CLINIC_OR_DEPARTMENT_OTHER): Payer: Medicare HMO | Admitting: Physician Assistant

## 2021-10-07 ENCOUNTER — Other Ambulatory Visit: Payer: Self-pay

## 2021-10-07 VITALS — BP 139/67 | HR 90 | Temp 97.3°F | Resp 14 | Wt 192.5 lb

## 2021-10-07 DIAGNOSIS — D500 Iron deficiency anemia secondary to blood loss (chronic): Secondary | ICD-10-CM

## 2021-10-07 LAB — CBC WITH DIFFERENTIAL (CANCER CENTER ONLY)
Abs Immature Granulocytes: 0.01 10*3/uL (ref 0.00–0.07)
Basophils Absolute: 0.1 10*3/uL (ref 0.0–0.1)
Basophils Relative: 1 %
Eosinophils Absolute: 0.1 10*3/uL (ref 0.0–0.5)
Eosinophils Relative: 2 %
HCT: 32.5 % — ABNORMAL LOW (ref 36.0–46.0)
Hemoglobin: 10.4 g/dL — ABNORMAL LOW (ref 12.0–15.0)
Immature Granulocytes: 0 %
Lymphocytes Relative: 34 %
Lymphs Abs: 2.1 10*3/uL (ref 0.7–4.0)
MCH: 28.8 pg (ref 26.0–34.0)
MCHC: 32 g/dL (ref 30.0–36.0)
MCV: 90 fL (ref 80.0–100.0)
Monocytes Absolute: 0.5 10*3/uL (ref 0.1–1.0)
Monocytes Relative: 9 %
Neutro Abs: 3.3 10*3/uL (ref 1.7–7.7)
Neutrophils Relative %: 54 %
Platelet Count: 250 10*3/uL (ref 150–400)
RBC: 3.61 MIL/uL — ABNORMAL LOW (ref 3.87–5.11)
RDW: 14.6 % (ref 11.5–15.5)
WBC Count: 6 10*3/uL (ref 4.0–10.5)
nRBC: 0 % (ref 0.0–0.2)

## 2021-10-08 ENCOUNTER — Other Ambulatory Visit: Payer: Self-pay

## 2021-10-08 ENCOUNTER — Encounter: Payer: Self-pay | Admitting: Hematology and Oncology

## 2021-10-08 ENCOUNTER — Telehealth: Payer: Self-pay | Admitting: Physician Assistant

## 2021-10-08 ENCOUNTER — Telehealth: Payer: Self-pay

## 2021-10-08 DIAGNOSIS — D500 Iron deficiency anemia secondary to blood loss (chronic): Secondary | ICD-10-CM

## 2021-10-08 LAB — IRON AND IRON BINDING CAPACITY (CC-WL,HP ONLY)
Iron: 37 ug/dL (ref 28–170)
Saturation Ratios: 10 % — ABNORMAL LOW (ref 10.4–31.8)
TIBC: 378 ug/dL (ref 250–450)
UIBC: 341 ug/dL (ref 148–442)

## 2021-10-08 LAB — FERRITIN: Ferritin: 288 ng/mL (ref 11–307)

## 2021-10-08 NOTE — Progress Notes (Signed)
[place] [phone]   [phone]  PROGRESS NOTE  Patient Care Team: Orson Slick, MD as PCP - General (Hematology and Oncology)  Hematological/Oncological History #Iron Deficiency Anemia 2/2 to GI Bleeding 1) 2019: presented to [facility] in [facility] with symptoms of anemia. Found to have Hgb 5.0. EGD/colon/capsule reported found no source of bleed 2) 01/18/2019: establish care with Dr. Lorenso Courier  3) 03/08/2019: admitted for acute GI bleed. Endoscopy revealed an actively bleeding Dieulafoy lesion that was clipped. Received IV feraheme 518m x 1 dose during admission 4) 03/15/2019: received dose 2 of IV feraheme 5157min outpatient setting. 5) 04/13/2019: WBC 8.6, Hgb 12.2, MCV 92.1, Plt 216 6) 07/19/2019: WBC 7.1, Hgb 12.2, MCV 90.6, Plt 226  Interval History:  Dana Friel76.o. female with medical history significant for Dieulafoy lesion bleeding with iron deficiency anemia who presents for a follow up visit. The patient's last visit was on 08/03/2021. In the interim since the last visit she was hospitalized from 09/18/2021-09/21/2021 due to anemia with upper GI bleed. Patient received IV ferrlecit 250 mg x 2 doses during hospitalization.   On exam today Dana Nielsen notes her energy levels are slowly improving since hospitalization and receiving IV iron infusions. She is able to do her ADLs on her own. She denies any appetite changes. She denies nausea, vomiting or abdominal pain. She continues to have chronic constipation with a bowel movement every 4 days. She denies easy bruising or signs of active bleeding. She denies any fevers, chills, night sweats, shortness of breath, chest pain or cough. She has no other complaints.  A full 10 point ROS is listed below.  MEDICAL HISTORY:  Past Medical History:  Diagnosis Date   Diabetes mellitus type 2, diet-controlled ([facility]   Family history of colon cancer 12/09/2020   Family history of lung cancer  12/09/2020   Family history of multiple myeloma 12/09/2020   HLD (hyperlipidemia)    Hypothyroidism    Iron deficiency anemia     SURGICAL HISTORY: Past Surgical History:  Procedure Laterality Date   arthroscopic knee     BIOPSY  03/08/2019   Procedure: BIOPSY;  Surgeon: CiLavena BullionDO;  Location: [facility];  Service: Gastroenterology;;   C sections     x 7   [place] [facility] [place]   ENTEROSCOPY N/A 03/08/2019   Procedure: ENTEROSCOPY;  Surgeon: CiLavena BullionDO;  Location: [facility];  Service: Gastroenterology;  Laterality: N/A;   ENTEROSCOPY N/A 09/19/2021   Procedure: ENTEROSCOPY;  Surgeon: ArYetta FlockMD;  Location: [facility];  Service: Gastroenterology;  Laterality: N/A;   HEMOSTASIS CLIP PLACEMENT  03/08/2019   Procedure: HEMOSTASIS CLIP PLACEMENT;  Surgeon: CiLavena BullionDO;  Location: [facility];  Service: Gastroenterology;;   HEMOSTASIS CLIP PLACEMENT  09/19/2021   Procedure: HEMOSTASIS CLIP PLACEMENT;  Surgeon: ArYetta FlockMD;  Location: [facility];  Service: Gastroenterology;;   HOT HEMOSTASIS N/A 09/19/2021   Procedure: HOT HEMOSTASIS (ARGON PLASMA COAGULATION/BICAP);  Surgeon: ArYetta FlockMD;  [facility];  Service: Gastroenterology;  Laterality: N/A;   SUBMUCOSAL TATTOO INJECTION  03/08/2019   Procedure: SUBMUCOSAL TATTOO INJECTION;  Surgeon: CiLavena BullionDO;  Location: [facility];  Service: Gastroenterology;;   SUBMUCOSAL TATTOO INJECTION  09/19/2021   Procedure: SUBMUCOSAL TATTOO INJECTION;  Surgeon: ArYetta FlockMD;  Location: [facility];  Service: Gastroenterology;;   TONSILLECTOMY  WISDOM TOOTH EXTRACTION     WRIST SURGERY      SOCIAL HISTORY: Social History   Socioeconomic History   Marital status: Widowed    Spouse name: Not on file   Number of children: 9   Years of education: Not on file   Highest education level: Not on file   Occupational History   Not on file  Tobacco Use   Smoking status: Never   Smokeless tobacco: Never  Vaping Use   Vaping Use: Never used  Substance and Sexual Activity   Alcohol use: Not Currently   Drug use: Never   Sexual activity: Not on file  Other Topics Concern   Not on file  Social History Narrative   Not on file   Social Determinants of Health   Financial Resource Strain: Not on file  Food Insecurity: Not on file  Transportation Needs: Not on file  Physical Activity: Not on file  Stress: Not on file  Social Connections: Not on file  Intimate Partner Violence: Not on file    FAMILY HISTORY: Family History  Problem Relation Age of Onset   Multiple myeloma Mother 17   Heart attack Father    Lung cancer Sister 32       smoking hx   Leukemia Paternal Aunt        dx before 49   Cancer Paternal Aunt        unknown type; dx after 21   Colon cancer Paternal Aunt        dx after 50   Head & neck cancer Paternal Uncle        dx before 52   Cancer Paternal Uncle        unknown type; dx after 54   Cancer Paternal Uncle        color or gastric; dx after 50    ALLERGIES:  is allergic to lidocaine and macrobid [nitrofurantoin].  MEDICATIONS:  Current Outpatient Medications  Medication Sig Dispense Refill   cetirizine (ZYRTEC) 10 MG tablet Take 10 mg by mouth at bedtime.     Cyanocobalamin (VITAMIN B-12 PO) Take 1 tablet by mouth in the morning.     losartan (COZAAR) 25 MG tablet Take 25 mg by mouth in the morning.     magnesium oxide (MAG-OX) 400 (240 Mg) MG tablet Take 1 tablet (400 mg total) by mouth daily. (Patient taking differently: Take 400 mg by mouth in the morning.) 30 tablet 1   metFORMIN (GLUCOPHAGE) 1000 MG tablet Take 1,000 mg by mouth 2 (two) times daily with a meal.     Multiple Vitamins-Minerals (HAIR/SKIN/NAILS) CAPS Take 1 capsule by mouth in the morning.     Multiple Vitamins-Minerals (ONE-A-DAY WOMENS 50+) TABS Take 1 tablet by mouth in the  morning.     omeprazole (PRILOSEC) 40 MG capsule Take 40 mg by mouth in the morning.     simvastatin (ZOCOR) 40 MG tablet Take 1 tablet (40 mg total) by mouth daily at 6 PM. (Patient taking differently: Take 40 mg by mouth at bedtime.) 90 tablet 3   SYNTHROID 100 MCG tablet Take 100 mcg by mouth daily before breakfast.     traZODone (DESYREL) 100 MG tablet TAKE 1 TABLET BY MOUTH AT BEDTIME (Patient taking differently: Take 100 mg by mouth at bedtime.) 90 tablet 0   venlafaxine XR (EFFEXOR-XR) 37.5 MG 24 hr capsule Take 37.5 mg by mouth in the morning.     VICTOZA 18 MG/3ML SOPN Inject 1.2  mg into the skin in the morning.     triamcinolone cream (KENALOG) 0.1 % Apply 1 Application topically daily as needed (itching, irritation). (Patient not taking: Reported on 10/07/2021)     No current facility-administered medications for this visit.    REVIEW OF SYSTEMS:   Constitutional: ( - ) fevers, ( - )  chills , ( - ) night sweats Eyes: ( - ) blurriness of vision, ( - ) double vision, ( - ) watery eyes Ears, nose, mouth, throat, and face: ( - ) mucositis, ( - ) sore throat Respiratory: ( - ) cough, ( - ) dyspnea, ( - ) wheezes Cardiovascular: ( - ) palpitation, ( - ) chest discomfort, ( - ) lower extremity swelling Gastrointestinal:  ( - ) nausea, ( - ) heartburn, ( - ) change in bowel habits Skin: ( - ) abnormal skin rashes Lymphatics: ( - ) new lymphadenopathy, ( - ) easy bruising Neurological: ( - ) numbness, ( - ) tingling, ( - ) new weaknesses Behavioral/Psych: ( - ) mood change, ( - ) new changes  All other systems were reviewed with the patient and are negative.  PHYSICAL EXAMINATION: ECOG PERFORMANCE STATUS: 1 - Symptomatic but completely ambulatory  Vitals:   10/07/21 1551  BP: 139/67  Pulse: 90  Resp: 14  Temp: (!) 97.3 F (36.3 C)  SpO2: 96%    Filed Weights   10/07/21 1551  Weight: 192 lb 8 oz (87.3 kg)     GENERAL:well appearing elderly Caucasian female alert, no  distress and comfortable SKIN: skin color, texture, turgor are normal, no rashes or significant lesions EYES: conjunctiva are pink and non-injected, sclera clear LUNGS: clear to auscultation and percussion with normal breathing effort HEART: regular rate & rhythm and no murmurs and no lower extremity edema Musculoskeletal: no cyanosis of digits and no clubbing  PSYCH: alert & oriented x 3, fluent speech NEURO: no focal motor/sensory deficits  LABORATORY DATA:  I have reviewed the data as listed    Latest Ref Rng & Units 10/07/2021    3:29 PM 10/05/2021    2:30 PM 09/30/2021    2:45 PM  CBC  WBC 4.0 - 10.5 K/uL 6.0  5.6  6.4   Hemoglobin 12.0 - 15.0 g/dL 10.4  10.1  10.0   Hematocrit 36.0 - 46.0 % 32.5  31.9  31.4   Platelets 150 - 400 K/uL 250  274  341        Latest Ref Rng & Units 09/24/2021    2:33 PM 09/21/2021    5:06 AM 09/20/2021    5:37 AM  CMP  Glucose 70 - 99 mg/dL 132  135  129   BUN 8 - 23 mg/dL _0 Creatinine 0.44 - 1.00 mg/dL 0.91  0.86  0.93   Sodium 135 - 145 mmol/L 139  145  141   Potassium 3.5 - 5.1 mmol/L 4.1  4.1  4.1   Chloride 98 - 111 mmol/L 107  111  105   CO2 22 - 32 mmol/L _1 Calcium 8.9 - 10.3 mg/dL 9.9  9.2  9.1   Total Protein 6.5 - 8.1 g/dL 7.0     Total Bilirubin 0.3 - 1.2 mg/dL 0.3     Alkaline Phos 38 - 126 U/L 56     AST 15 - 41 U/L 21     ALT 0 - 44 U/L 27  RADIOGRAPHIC STUDIES: VAS [place] UPPER EXTREMITY VENOUS DUPLEX  Result Date: 09/25/2021 UPPER VENOUS STUDY  Patient Name:  Dana Nielsen  Date of Exam:   09/25/2021 Medical Rec #: [id number]          Accession #:    [id number] Date of Birth: 04-11-1945           Patient Gender: F Patient Age:   59 years Exam Location:  [facility] Procedure:      VAS [place] UPPER EXTREMITY VENOUS DUPLEX Referring Phys: Sherol Dade --------------------------------------------------------------------------------  Indications: Swelling, and Pain Performing Technologist:  Archie Patten RVS  Examination Guidelines: A complete evaluation includes B-mode imaging, spectral Doppler, color Doppler, and power Doppler as needed of all accessible portions of each vessel. Bilateral testing is considered an integral part of a complete examination. Limited examinations for reoccurring indications may be performed as noted.  Right Findings: +----------+------------+---------+-----------+----------+-------+ RIGHT     CompressiblePhasicitySpontaneousPropertiesSummary +----------+------------+---------+-----------+----------+-------+ Subclavian    Full       Yes       Yes                      +----------+------------+---------+-----------+----------+-------+  Left Findings: +----------+------------+---------+-----------+----------+-----------------+ LEFT      CompressiblePhasicitySpontaneousProperties     Summary      +----------+------------+---------+-----------+----------+-----------------+ IJV           Full       Yes       Yes                                +----------+------------+---------+-----------+----------+-----------------+ Subclavian    Full       Yes       Yes                                +----------+------------+---------+-----------+----------+-----------------+ Axillary      Full       Yes       Yes                                +----------+------------+---------+-----------+----------+-----------------+ Brachial      Full       Yes       Yes                                +----------+------------+---------+-----------+----------+-----------------+ Radial        Full                                                    +----------+------------+---------+-----------+----------+-----------------+ Ulnar         Full                                                    +----------+------------+---------+-----------+----------+-----------------+ Cephalic      None  Age Indeterminate  +----------+------------+---------+-----------+----------+-----------------+ Basilic       Full                                                    +----------+------------+---------+-----------+----------+-----------------+  Summary:  Right: No evidence of thrombosis in the subclavian.  Left: No evidence of deep vein thrombosis in the upper extremity. Findings consistent with age indeterminate superficial vein thrombosis involving the left cephalic vein.  *See table(s) above for measurements and observations.  Diagnosing physician: Servando Snare MD Electronically signed by Servando Snare MD on 09/25/2021 at 4:21:17 PM.    Final     ASSESSMENT & PLAN Dana Nielsen 76 y.o. female with medical history significant for Dieulafoy lesion bleeding with iron deficiency anemia who presents for a follow up visit.   #Iron Deficiency Anemia 2/2 to GI Bleeding --no evidence of bleeding on today's exam.  --most recent small bowel endoscopy from 09/19/2021 showed dieulafoy lesion with bleeding in the distal duodenum, a few cms distal to prior tattoo.Treated with argon plasma coagulation --last received IV ferrlecit 250 mg x 2 doses during recent hospitalization. --unable to tolerate PO iron due to chronic constipation.  --labs today shows improving anemia with Hgb 10.4. MCV 90.0. Iron panel shows persistent deficiency with iron saturation 10%, ferritin levels pending.  --recommend another round of IV iron to bolster levels. We will request IV feraheme 510 mg x 2 doses.  --RTC in 2-3 weeks to closely monitor labs.   # Hypomagnesemia -- Patient has low levels of magnesium, unclear etiology.  May be secondary to diarrhea bouts --Patient takes 400 mg p.o. of magnesium daily.   --Most recent magnesium level from 09/21/2021 showed improvement measuring 1.6 mg/dL.  --recommend to continue magnesium supplement.   #Jehovah's Witness --the patient is not to receive blood products under any circumstances, per her  request. We have copied and uploaded her Blood Contract which she carries into her wallet into our system. --in the event the patient is admitted with severe anemia or bleed please make the Hematology service aware so that we may provide bloodless options for support.    Orders Placed This Encounter  Procedures   Iron and Iron Binding Capacity ([facility] only)    Standing Status:   Future    Number of Occurrences:   1    Standing Expiration Date:   10/08/2022   Ferritin    Standing Status:   Future    Number of Occurrences:   1    Standing Expiration Date:   10/07/2022   All questions were answered. The patient knows to call the clinic with any problems, questions or concerns.  I have spent a total of 30 minutes minutes of face-to-face and non-face-to-face time, preparing to see the patient, performing a medically appropriate examination, counseling and educating the patient, ordering medications, documenting clinical information in the electronic health record, and care coordination.   Dede Query PA-C Dept of Hematology and [place] [facility] Phone: [phone]   10/08/2021 11:25 AM

## 2021-10-08 NOTE — Telephone Encounter (Signed)
Dana Nielsen: Please notify patient that she still has iron deficiency and needs more IV iron of IV feraheme 510 mg once a week x 2 doses starting next week  LM for pt regarding lab results and the need for IV iron.  Pt advised scheduling will call to set up appts to start next week.

## 2021-10-08 NOTE — Telephone Encounter (Signed)
Per 9/28 secure chat called and left message for pt about appointments

## 2021-10-14 ENCOUNTER — Other Ambulatory Visit: Payer: Self-pay | Admitting: Physician Assistant

## 2021-10-15 ENCOUNTER — Other Ambulatory Visit: Payer: Self-pay

## 2021-10-15 ENCOUNTER — Inpatient Hospital Stay: Payer: Medicare HMO | Attending: Hematology and Oncology

## 2021-10-15 VITALS — BP 117/76 | HR 95 | Temp 99.1°F | Resp 18

## 2021-10-15 DIAGNOSIS — Z800 Family history of malignant neoplasm of digestive organs: Secondary | ICD-10-CM | POA: Diagnosis not present

## 2021-10-15 DIAGNOSIS — K5900 Constipation, unspecified: Secondary | ICD-10-CM | POA: Insufficient documentation

## 2021-10-15 DIAGNOSIS — Z801 Family history of malignant neoplasm of trachea, bronchus and lung: Secondary | ICD-10-CM | POA: Insufficient documentation

## 2021-10-15 DIAGNOSIS — Z807 Family history of other malignant neoplasms of lymphoid, hematopoietic and related tissues: Secondary | ICD-10-CM | POA: Diagnosis not present

## 2021-10-15 DIAGNOSIS — D500 Iron deficiency anemia secondary to blood loss (chronic): Secondary | ICD-10-CM | POA: Diagnosis not present

## 2021-10-15 DIAGNOSIS — Z806 Family history of leukemia: Secondary | ICD-10-CM | POA: Diagnosis not present

## 2021-10-15 DIAGNOSIS — E119 Type 2 diabetes mellitus without complications: Secondary | ICD-10-CM | POA: Diagnosis not present

## 2021-10-15 MED ORDER — SODIUM CHLORIDE 0.9 % IV SOLN
200.0000 mg | Freq: Once | INTRAVENOUS | Status: AC
  Administered 2021-10-15: 200 mg via INTRAVENOUS
  Filled 2021-10-15: qty 200

## 2021-10-15 MED ORDER — SODIUM CHLORIDE 0.9 % IV SOLN: Freq: Once | INTRAVENOUS | Status: AC

## 2021-10-15 NOTE — Patient Instructions (Signed)

## 2021-10-22 ENCOUNTER — Other Ambulatory Visit: Payer: Self-pay

## 2021-10-22 ENCOUNTER — Inpatient Hospital Stay: Payer: Medicare HMO

## 2021-10-22 VITALS — BP 124/83 | HR 83 | Temp 97.7°F | Resp 16

## 2021-10-22 DIAGNOSIS — D500 Iron deficiency anemia secondary to blood loss (chronic): Secondary | ICD-10-CM

## 2021-10-22 MED ORDER — SODIUM CHLORIDE 0.9 % IV SOLN
200.0000 mg | Freq: Once | INTRAVENOUS | Status: AC
  Administered 2021-10-22: 200 mg via INTRAVENOUS
  Filled 2021-10-22: qty 200

## 2021-10-22 MED ORDER — SODIUM CHLORIDE 0.9 % IV SOLN: Freq: Once | INTRAVENOUS | Status: AC

## 2021-10-22 NOTE — Patient Instructions (Signed)

## 2021-10-26 ENCOUNTER — Other Ambulatory Visit: Payer: Self-pay | Admitting: Physician Assistant

## 2021-10-26 DIAGNOSIS — D500 Iron deficiency anemia secondary to blood loss (chronic): Secondary | ICD-10-CM

## 2021-10-27 ENCOUNTER — Inpatient Hospital Stay: Payer: Medicare HMO

## 2021-10-27 ENCOUNTER — Inpatient Hospital Stay (HOSPITAL_BASED_OUTPATIENT_CLINIC_OR_DEPARTMENT_OTHER): Payer: Medicare HMO | Admitting: Physician Assistant

## 2021-10-27 VITALS — BP 129/70 | HR 95 | Temp 97.5°F | Resp 15 | Wt 189.4 lb

## 2021-10-27 DIAGNOSIS — D500 Iron deficiency anemia secondary to blood loss (chronic): Secondary | ICD-10-CM | POA: Diagnosis not present

## 2021-10-27 LAB — CBC WITH DIFFERENTIAL (CANCER CENTER ONLY)
Abs Immature Granulocytes: 0.03 10*3/uL (ref 0.00–0.07)
Basophils Absolute: 0.1 10*3/uL (ref 0.0–0.1)
Basophils Relative: 1 %
Eosinophils Absolute: 0.2 10*3/uL (ref 0.0–0.5)
Eosinophils Relative: 3 %
HCT: 34.5 % — ABNORMAL LOW (ref 36.0–46.0)
Hemoglobin: 10.9 g/dL — ABNORMAL LOW (ref 12.0–15.0)
Immature Granulocytes: 1 %
Lymphocytes Relative: 29 %
Lymphs Abs: 1.8 10*3/uL (ref 0.7–4.0)
MCH: 28.1 pg (ref 26.0–34.0)
MCHC: 31.6 g/dL (ref 30.0–36.0)
MCV: 88.9 fL (ref 80.0–100.0)
Monocytes Absolute: 0.6 10*3/uL (ref 0.1–1.0)
Monocytes Relative: 9 %
Neutro Abs: 3.7 10*3/uL (ref 1.7–7.7)
Neutrophils Relative %: 57 %
Platelet Count: 265 10*3/uL (ref 150–400)
RBC: 3.88 MIL/uL (ref 3.87–5.11)
RDW: 14.8 % (ref 11.5–15.5)
WBC Count: 6.4 10*3/uL (ref 4.0–10.5)
nRBC: 0 % (ref 0.0–0.2)

## 2021-10-27 LAB — IRON AND IRON BINDING CAPACITY (CC-WL,HP ONLY)
Iron: 48 ug/dL (ref 28–170)
Saturation Ratios: 14 % (ref 10.4–31.8)
TIBC: 354 ug/dL (ref 250–450)
UIBC: 306 ug/dL (ref 148–442)

## 2021-10-27 NOTE — Progress Notes (Signed)
[place] [phone]   [phone]  PROGRESS NOTE  Patient Care Team: Orson Slick, MD as PCP - General (Hematology and Oncology)  Hematological/Oncological History #Iron Deficiency Anemia 2/2 to GI Bleeding 1) 2019: presented to [facility] in [facility] with symptoms of anemia. Found to have Hgb 5.0. EGD/colon/capsule reported found no source of bleed 2) 01/18/2019: establish care with Dr. Lorenso Courier  3) 03/08/2019: admitted for acute GI bleed. Endoscopy revealed an actively bleeding Dieulafoy lesion that was clipped. Received IV feraheme 565m x 1 dose during admission 4) 03/15/2019: received dose 2 of IV feraheme 5135min outpatient setting. 5) 04/13/2019: WBC 8.6, Hgb 12.2, MCV 92.1, Plt 216 6) 07/19/2019: WBC 7.1, Hgb 12.2, MCV 90.6, Plt 226 7) 09/20/2021-09/21/2021: IV Ferrleciti 250 mg x 2 doses 8) 10/15/2021-1012/2023: IV venofer 200 mg x 2 doses   Interval History:  Dana Mainer76.0. female with medical history significant for Dieulafoy lesion bleeding with iron deficiency anemia who presents for a follow up visit. The patient's last visit was on 10/07/2021. In the interim since the last visit she received IV Venofer 200 mg x 2 doses  On exam today Dana Nielsen reports her energy levels continue to improve.  She feels more active and is able to complete her ADLs on her own.  She denies any appetite changes or weight loss.  She denies nausea, vomiting or abdominal pain.  She does report intermittent episodes of constipation which improves with milk of magnesia as needed.  She denies easy bruising or signs of active bleeding including hematochezia or melena.  Patient denies fevers, chills, night sweats, shortness of breath, chest pain or cough.  She has no other complaints. A full 10 point ROS is listed below.  MEDICAL HISTORY:  Past Medical History:  Diagnosis Date   Diabetes mellitus type 2, diet-controlled ([place]   Family history of colon  cancer 12/09/2020   Family history of lung cancer 12/09/2020   Family history of multiple myeloma 12/09/2020   HLD (hyperlipidemia)    Hypothyroidism    Iron deficiency anemia     SURGICAL HISTORY: Past Surgical History:  Procedure Laterality Date   arthroscopic knee     BIOPSY  03/08/2019   Procedure: BIOPSY;  Surgeon: CiLavena BullionDO;  [facility];  Service: Gastroenterology;;   C sections     x 7   [place]   ENTEROSCOPY N/A 03/08/2019   Procedure: ENTEROSCOPY;  Surgeon: CiLavena BullionDO;  [facility];  Service: Gastroenterology;  Laterality: N/A;   ENTEROSCOPY N/A 09/19/2021   Procedure: ENTEROSCOPY;  Surgeon: ArYetta FlockMD;  [facility];  Service: Gastroenterology;  Laterality: N/A;   HEMOSTASIS CLIP PLACEMENT  03/08/2019   Procedure: HEMOSTASIS CLIP PLACEMENT;  Surgeon: CiLavena BullionDO;  [facility];  Service: Gastroenterology;;   HEMOSTASIS CLIP PLACEMENT  09/19/2021   Procedure: HEMOSTASIS CLIP PLACEMENT;  Surgeon: ArYetta FlockMD;  [facility];  Service: Gastroenterology;;   HOT HEMOSTASIS N/A 09/19/2021   Procedure: HOT HEMOSTASIS (ARGON PLASMA COAGULATION/BICAP);  Surgeon: ArYetta FlockMD;  [facility];  Service: Gastroenterology;  Laterality: N/A;   SUBMUCOSAL TATTOO INJECTION  03/08/2019   Procedure: SUBMUCOSAL TATTOO INJECTION;  Surgeon: CiLavena BullionDO;  [facility];  Service: Gastroenterology;;   SUBMUCOSAL TATTOO INJECTION  09/19/2021   Procedure: SUBMUCOSAL TATTOO INJECTION;  Surgeon:  Armbruster, Carlota Raspberry, MD;  [facility];  Service: Gastroenterology;;   TONSILLECTOMY     WISDOM TOOTH EXTRACTION     WRIST SURGERY      SOCIAL HISTORY: Social History   Socioeconomic History   Marital status: Widowed    Spouse name: Not on file   Number of children: 9   Years of education: Not  on file   Highest education level: Not on file  Occupational History   Not on file  Tobacco Use   Smoking status: Never   Smokeless tobacco: Never  Vaping Use   Vaping Use: Never used  Substance and Sexual Activity   Alcohol use: Not Currently   Drug use: Never   Sexual activity: Not on file  Other Topics Concern   Not on file  Social History Narrative   Not on file   Social Determinants of Health   Financial Resource Strain: Not on file  Food Insecurity: Not on file  Transportation Needs: Not on file  Physical Activity: Not on file  Stress: Not on file  Social Connections: Not on file  Intimate Partner Violence: Not on file    FAMILY HISTORY: Family History  Problem Relation Age of Onset   Multiple myeloma Mother 78   Heart attack Father    Lung cancer Sister 81       smoking hx   Leukemia Paternal Aunt        dx before 14   Cancer Paternal Aunt        unknown type; dx after 26   Colon cancer Paternal Aunt        dx after 50   Head & neck cancer Paternal Uncle        dx before 24   Cancer Paternal Uncle        unknown type; dx after 25   Cancer Paternal Uncle        color or gastric; dx after 50    ALLERGIES:  is allergic to lidocaine and macrobid [nitrofurantoin].  MEDICATIONS:  Current Outpatient Medications  Medication Sig Dispense Refill   cetirizine (ZYRTEC) 10 MG tablet Take 10 mg by mouth at bedtime.     Cyanocobalamin (VITAMIN B-12 PO) Take 1 tablet by mouth in the morning.     losartan (COZAAR) 25 MG tablet Take 25 mg by mouth in the morning.     magnesium oxide (MAG-OX) 400 (240 Mg) MG tablet Take 1 tablet (400 mg total) by mouth daily. (Patient taking differently: Take 400 mg by mouth in the morning.) 30 tablet 1   metFORMIN (GLUCOPHAGE) 1000 MG tablet Take 1,000 mg by mouth 2 (two) times daily with a meal.     Multiple Vitamins-Minerals (HAIR/SKIN/NAILS) CAPS Take 1 capsule by mouth in the morning.     Multiple Vitamins-Minerals (ONE-A-DAY  WOMENS 50+) TABS Take 1 tablet by mouth in the morning.     omeprazole (PRILOSEC) 40 MG capsule Take 40 mg by mouth in the morning.     simvastatin (ZOCOR) 40 MG tablet Take 1 tablet (40 mg total) by mouth daily at 6 PM. (Patient taking differently: Take 40 mg by mouth at bedtime.) 90 tablet 3   SYNTHROID 100 MCG tablet Take 100 mcg by mouth daily before breakfast.     traZODone (DESYREL) 100 MG tablet TAKE 1 TABLET BY MOUTH AT BEDTIME (Patient taking differently: Take 100 mg by mouth at bedtime.) 90 tablet 0   venlafaxine XR (EFFEXOR-XR) 37.5 MG 24 hr capsule  Take 37.5 mg by mouth in the morning.     VICTOZA 18 MG/3ML SOPN Inject 1.2 mg into the skin in the morning.     No current facility-administered medications for this visit.    REVIEW OF SYSTEMS:   Constitutional: ( - ) fevers, ( - )  chills , ( - ) night sweats Eyes: ( - ) blurriness of vision, ( - ) double vision, ( - ) watery eyes Ears, nose, mouth, throat, and face: ( - ) mucositis, ( - ) sore throat Respiratory: ( - ) cough, ( - ) dyspnea, ( - ) wheezes Cardiovascular: ( - ) palpitation, ( - ) chest discomfort, ( - ) lower extremity swelling Gastrointestinal:  ( - ) nausea, ( - ) heartburn, ( - ) change in bowel habits Skin: ( - ) abnormal skin rashes Lymphatics: ( - ) new lymphadenopathy, ( - ) easy bruising Neurological: ( - ) numbness, ( - ) tingling, ( - ) new weaknesses Behavioral/Psych: ( - ) mood change, ( - ) new changes  All other systems were reviewed with the patient and are negative.  PHYSICAL EXAMINATION: ECOG PERFORMANCE STATUS: 1 - Symptomatic but completely ambulatory  Vitals:   10/27/21 1508  BP: 129/70  Pulse: 95  Resp: 15  Temp: (!) 97.5 F (36.4 C)  SpO2: 97%    Filed Weights   10/27/21 1508  Weight: 189 lb 6.4 oz (85.9 kg)     GENERAL:well appearing elderly Caucasian female alert, no distress and comfortable SKIN: skin color, texture, turgor are normal, no rashes or significant  lesions EYES: conjunctiva are pink and non-injected, sclera clear LUNGS: clear to auscultation and percussion with normal breathing effort HEART: regular rate & rhythm and no murmurs and no lower extremity edema Musculoskeletal: no cyanosis of digits and no clubbing  PSYCH: alert & oriented x 3, fluent speech NEURO: no focal motor/sensory deficits  LABORATORY DATA:  I have reviewed the data as listed    Latest Ref Rng & Units 10/27/2021    2:48 PM 10/07/2021    3:29 PM 10/05/2021    2:30 PM  CBC  WBC 4.0 - 10.5 K/uL 6.4  6.0  5.6   Hemoglobin 12.0 - 15.0 g/dL 10.9  10.4  10.1   Hematocrit 36.0 - 46.0 % 34.5  32.5  31.9   Platelets 150 - 400 K/uL 265  250  274        Latest Ref Rng & Units 09/24/2021    2:33 PM 09/21/2021    5:06 AM 09/20/2021    5:37 AM  CMP  Glucose 70 - 99 mg/dL 132  135  129   BUN 8 - 23 mg/dL _0 Creatinine 0.44 - 1.00 mg/dL 0.91  0.86  0.93   Sodium 135 - 145 mmol/L 139  145  141   Potassium 3.5 - 5.1 mmol/L 4.1  4.1  4.1   Chloride 98 - 111 mmol/L 107  111  105   CO2 22 - 32 mmol/L _1 Calcium 8.9 - 10.3 mg/dL 9.9  9.2  9.1   Total Protein 6.5 - 8.1 g/dL 7.0     Total Bilirubin 0.3 - 1.2 mg/dL 0.3     Alkaline Phos 38 - 126 U/L 56     AST 15 - 41 U/L 21     ALT 0 - 44 U/L 27         RADIOGRAPHIC  STUDIES: No results found.  ASSESSMENT & PLAN Dana Nielsen 76 y.o. female with medical history significant for Dieulafoy lesion bleeding with iron deficiency anemia who presents for a follow up visit.   #Iron Deficiency Anemia 2/2 to GI Bleeding --no evidence of bleeding on today's exam.  --most recent small bowel endoscopy from 09/19/2021 showed dieulafoy lesion with bleeding in the distal duodenum, a few cms distal to prior tattoo.Treated with argon plasma coagulation --last received IV venofer 200 mg x 2 doses on 10/15/2021 and 10/22/2021.  --unable to tolerate PO iron due to chronic constipation.  --labs today shows improving  anemia with Hgb 10.9. MCV 88.9. Iron panel shows improvement with iron 48, saturation 14%, ferritin 498. --No need for IV iron at this time.  --RTC in 4 weeks for lab only check. Then return for labs/f/u visit in January 2024 with Dr. Lorenso Courier.   # Hypomagnesemia -- Patient has low levels of magnesium, unclear etiology.  May be secondary to diarrhea bouts --Patient takes 400 mg p.o. of magnesium daily.   --Most recent magnesium level from 09/21/2021 showed improvement measuring 1.6 mg/dL.  --recommend to continue magnesium supplement.   #Jehovah's Witness --the patient is not to receive blood products under any circumstances, per her request. We have copied and uploaded her Blood Contract which she carries into her wallet into our system. --in the event the patient is admitted with severe anemia or bleed please make the Hematology service aware so that we may provide bloodless options for support.    No orders of the defined types were placed in this encounter.  All questions were answered. The patient knows to call the clinic with any problems, questions or concerns.  I have spent a total of 30 minutes minutes of face-to-face and non-face-to-face time, preparing to see the patient, performing a medically appropriate examination, counseling and educating the patient, ordering medications, documenting clinical information in the electronic health record, and care coordination.   Dede Query PA-C Dept of Hematology and [place] at [facility] Phone: [phone]   10/27/2021 4:58 PM

## 2021-10-28 LAB — FERRITIN: Ferritin: 498 ng/mL — ABNORMAL HIGH (ref 11–307)

## 2021-10-30 ENCOUNTER — Telehealth: Payer: Self-pay

## 2021-10-30 NOTE — Telephone Encounter (Signed)
LM for pt with lab results and recommendations. 

## 2021-10-30 NOTE — Telephone Encounter (Signed)
-----   Message from Lincoln Brigham, PA-C sent at 10/29/2021  3:51 PM EDT ----- Please notify patient that iron has improved. Continue to monitor for now. Additional IV iron not needed at this time.   Thanks, Murray Hodgkins ----- Message ----- From: Buel Ream, Lab In [place] Sent: 10/27/2021   3:06 PM EDT To: Lincoln Brigham, PA-C

## 2021-11-01 ENCOUNTER — Encounter: Payer: Self-pay | Admitting: Hematology and Oncology

## 2021-11-01 ENCOUNTER — Encounter: Payer: Self-pay | Admitting: Physician Assistant

## 2021-11-05 ENCOUNTER — Other Ambulatory Visit: Payer: Self-pay | Admitting: *Deleted

## 2021-11-05 ENCOUNTER — Encounter: Payer: Self-pay | Admitting: Hematology and Oncology

## 2021-11-05 DIAGNOSIS — D500 Iron deficiency anemia secondary to blood loss (chronic): Secondary | ICD-10-CM

## 2021-11-05 NOTE — Progress Notes (Signed)
Pt made aware of lab appt on 11/06/21

## 2021-11-06 ENCOUNTER — Inpatient Hospital Stay: Payer: Medicare HMO

## 2021-11-06 ENCOUNTER — Telehealth: Payer: Self-pay | Admitting: *Deleted

## 2021-11-06 ENCOUNTER — Other Ambulatory Visit: Payer: Self-pay

## 2021-11-06 DIAGNOSIS — D500 Iron deficiency anemia secondary to blood loss (chronic): Secondary | ICD-10-CM

## 2021-11-06 LAB — CMP (CANCER CENTER ONLY)
ALT: 12 U/L (ref 0–44)
AST: 13 U/L — ABNORMAL LOW (ref 15–41)
Albumin: 4.3 g/dL (ref 3.5–5.0)
Alkaline Phosphatase: 59 U/L (ref 38–126)
Anion gap: 9 (ref 5–15)
BUN: 17 mg/dL (ref 8–23)
CO2: 27 mmol/L (ref 22–32)
Calcium: 10.1 mg/dL (ref 8.9–10.3)
Chloride: 103 mmol/L (ref 98–111)
Creatinine: 0.96 mg/dL (ref 0.44–1.00)
GFR, Estimated: >60 mL/min (ref >60)
Glucose, Bld: 135 mg/dL — ABNORMAL HIGH (ref 70–99)
Potassium: 4.1 mmol/L (ref 3.5–5.1)
Sodium: 139 mmol/L (ref 135–145)
Total Bilirubin: 0.3 mg/dL (ref 0.3–1.2)
Total Protein: 8 g/dL (ref 6.5–8.1)

## 2021-11-06 LAB — RETIC PANEL
Immature Retic Fract: 15.7 % (ref 2.3–15.9)
RBC.: 4.19 MIL/uL (ref 3.87–5.11)
Retic Count, Absolute: 66.2 10*3/uL (ref 19.0–186.0)
Retic Ct Pct: 1.6 % (ref 0.4–3.1)
Reticulocyte Hemoglobin: 28 pg (ref >27.9)

## 2021-11-06 LAB — CBC WITH DIFFERENTIAL (CANCER CENTER ONLY)
Abs Immature Granulocytes: 0.03 10*3/uL (ref 0.00–0.07)
Basophils Absolute: 0.1 10*3/uL (ref 0.0–0.1)
Basophils Relative: 1 %
Eosinophils Absolute: 0.2 10*3/uL (ref 0.0–0.5)
Eosinophils Relative: 2 %
HCT: 36.4 % (ref 36.0–46.0)
Hemoglobin: 11.7 g/dL — ABNORMAL LOW (ref 12.0–15.0)
Immature Granulocytes: 0 %
Lymphocytes Relative: 25 %
Lymphs Abs: 2 10*3/uL (ref 0.7–4.0)
MCH: 28.5 pg (ref 26.0–34.0)
MCHC: 32.1 g/dL (ref 30.0–36.0)
MCV: 88.8 fL (ref 80.0–100.0)
Monocytes Absolute: 0.6 10*3/uL (ref 0.1–1.0)
Monocytes Relative: 8 %
Neutro Abs: 5 10*3/uL (ref 1.7–7.7)
Neutrophils Relative %: 64 %
Platelet Count: 295 10*3/uL (ref 150–400)
RBC: 4.1 MIL/uL (ref 3.87–5.11)
RDW: 14.4 % (ref 11.5–15.5)
WBC Count: 7.8 10*3/uL (ref 4.0–10.5)
nRBC: 0 % (ref 0.0–0.2)

## 2021-11-06 LAB — IRON AND IRON BINDING CAPACITY (CC-WL,HP ONLY)
Iron: 50 ug/dL (ref 28–170)
Saturation Ratios: 14 % (ref 10.4–31.8)
TIBC: 356 ug/dL (ref 250–450)
UIBC: 306 ug/dL (ref 148–442)

## 2021-11-06 LAB — FERRITIN: Ferritin: 393 ng/mL — ABNORMAL HIGH (ref 11–307)

## 2021-11-06 NOTE — Telephone Encounter (Signed)
-----   Message from Orson Slick, MD sent at 11/06/2021  4:29 PM EDT ----- Please let Dana Nielsen know that her hemoglobin level was up to 11.7 and her iron levels are stable.  It may be another etiology for her fatigue.  Please have her reach out if things worsen over the weekend.  ----- Message ----- From: Interface, Lab In [place] Sent: 11/06/2021   2:29 PM EDT To: Orson Slick, MD

## 2021-11-06 NOTE — Telephone Encounter (Signed)
TCT patient regarding her labs from today. Spoke with her and advised that her HGB is stable and her iron levels are stable. Advised that her fatigue is not likely to be related to iron deficiency. Pt voiced understanding. She states she will call her PCP so she can be seen next week.

## 2021-11-24 ENCOUNTER — Inpatient Hospital Stay: Payer: Medicare HMO

## 2021-11-25 ENCOUNTER — Inpatient Hospital Stay: Payer: Medicare HMO | Attending: Hematology and Oncology

## 2021-11-25 DIAGNOSIS — D500 Iron deficiency anemia secondary to blood loss (chronic): Secondary | ICD-10-CM | POA: Diagnosis present

## 2021-11-25 LAB — CBC WITH DIFFERENTIAL (CANCER CENTER ONLY)
Abs Immature Granulocytes: 0.04 10*3/uL (ref 0.00–0.07)
Basophils Absolute: 0.1 10*3/uL (ref 0.0–0.1)
Basophils Relative: 1 %
Eosinophils Absolute: 0.2 10*3/uL (ref 0.0–0.5)
Eosinophils Relative: 3 %
HCT: 36.9 % (ref 36.0–46.0)
Hemoglobin: 11.6 g/dL — ABNORMAL LOW (ref 12.0–15.0)
Immature Granulocytes: 1 %
Lymphocytes Relative: 27 %
Lymphs Abs: 1.9 10*3/uL (ref 0.7–4.0)
MCH: 27.7 pg (ref 26.0–34.0)
MCHC: 31.4 g/dL (ref 30.0–36.0)
MCV: 88.1 fL (ref 80.0–100.0)
Monocytes Absolute: 0.5 10*3/uL (ref 0.1–1.0)
Monocytes Relative: 8 %
Neutro Abs: 4.3 10*3/uL (ref 1.7–7.7)
Neutrophils Relative %: 60 %
Platelet Count: 267 10*3/uL (ref 150–400)
RBC: 4.19 MIL/uL (ref 3.87–5.11)
RDW: 14.6 % (ref 11.5–15.5)
WBC Count: 7 10*3/uL (ref 4.0–10.5)
nRBC: 0 % (ref 0.0–0.2)

## 2021-11-25 LAB — IRON AND IRON BINDING CAPACITY (CC-WL,HP ONLY)
Iron: 63 ug/dL (ref 28–170)
Saturation Ratios: 17 % (ref 10.4–31.8)
TIBC: 363 ug/dL (ref 250–450)
UIBC: 300 ug/dL (ref 148–442)

## 2021-11-25 LAB — FERRITIN: Ferritin: 309 ng/mL — ABNORMAL HIGH (ref 11–307)

## 2021-11-27 ENCOUNTER — Telehealth: Payer: Self-pay

## 2021-11-27 NOTE — Telephone Encounter (Signed)
Pt advised of lab results via My Chart message

## 2021-11-27 NOTE — Telephone Encounter (Signed)
-----   Message from Lincoln Brigham, PA-C sent at 11/26/2021  9:59 AM EST ----- Iron and hgb are overall stable. No need for IV iron at this time.    ----- Message ----- From: Interface, Lab In [place] Sent: 11/25/2021   1:29 PM EST To: Lincoln Brigham, PA-C

## 2021-12-20 ENCOUNTER — Ambulatory Visit (HOSPITAL_COMMUNITY)
Admission: EM | Admit: 2021-12-20 | Discharge: 2021-12-20 | Disposition: A | Discharge status: Home / Self Care | Payer: Medicare HMO | Attending: Emergency Medicine | Admitting: Emergency Medicine

## 2021-12-20 ENCOUNTER — Encounter (HOSPITAL_COMMUNITY): Payer: Self-pay | Admitting: *Deleted

## 2021-12-20 DIAGNOSIS — R3989 Other symptoms and signs involving the genitourinary system: Secondary | ICD-10-CM

## 2021-12-20 DIAGNOSIS — Z711 Person with feared health complaint in whom no diagnosis is made: Secondary | ICD-10-CM | POA: Diagnosis not present

## 2021-12-20 LAB — POCT URINALYSIS DIPSTICK, ED / UC
Bilirubin Urine: NEGATIVE
Glucose, UA: NEGATIVE mg/dL
Hgb urine dipstick: NEGATIVE
Ketones, ur: NEGATIVE mg/dL
Nitrite: NEGATIVE
Protein, ur: NEGATIVE mg/dL
Specific Gravity, Urine: 1.02 (ref 1.005–1.030)
Urobilinogen, UA: 0.2 mg/dL (ref 0.0–1.0)
pH: 7 (ref 5.0–8.0)

## 2021-12-20 NOTE — ED Provider Notes (Signed)
[place]    CSN: [id number] Arrival date & time: 12/20/21  1638     History   Chief Complaint Chief Complaint  Patient presents with   Diarrhea   Polyuria    HPI Dana Nielsen is a 76 y.o. female.  Presents with one episode of dark brown stool this morning, reports it smelled bad She was worried due to history of GI bleeds  Denies blood in stool, dark/tarry stool. No weakness, shortness of breath, dizziness or other signs of acute blood loss No vomiting.   Hx of iron deficiency anemia. Patient is Dana Nielsen witness   Additionally reports months history of bladder pressure and increased urination. Reports cyst "size of an apple" on her ovary that she's having removed this month History of diabetes. Sugars run in the 100-140s. Takes medications as prescribed  Past Medical History:  Diagnosis Date   Diabetes mellitus type 2, diet-controlled ([place])    Family history of colon cancer 12/09/2020   Family history of lung cancer 12/09/2020   Family history of multiple myeloma 12/09/2020   HLD (hyperlipidemia)    Hypothyroidism    Iron deficiency anemia    Ovarian cyst    Upper GI bleed     Patient Active Problem List   Diagnosis Date Noted   Hypomagnesemia 09/20/2021   Occult GI bleeding 09/19/2021   GI bleed 09/19/2021   Family history of lung cancer 12/09/2020   Family history of colon cancer 12/09/2020   Family history of multiple myeloma 12/09/2020   Seasonal allergic rhinitis due to pollen 04/06/2019   Arterial hypotension 03/20/2019   Labyrinthitis 03/20/2019   Iron deficiency anemia due to chronic blood loss 03/09/2019   Dieulafoy lesion of duodenum    Gastric ulcer without hemorrhage or perforation    Gastritis and gastroduodenitis    Hiatal hernia    Upper GI bleed 03/07/2019   Anemia 03/07/2019   Melena 03/05/2019   Primary insomnia 12/14/2018   Refusal of blood transfusions as patient is Jehovah's Witness 12/14/2018   Iron deficiency  anemia 12/14/2018   Hypothyroidism 12/14/2018   Depression, major, single episode, mild ([place]) 12/14/2018   Controlled type 2 diabetes mellitus with complication, without long-term current use of insulin ([place]) 12/14/2018   Essential hypertension 12/14/2018   Vitamin D deficiency 12/14/2018   Elevated cholesterol 12/14/2018    Past Surgical History:  Procedure Laterality Date   arthroscopic knee     BIOPSY  03/08/2019   Procedure: BIOPSY;  Surgeon: Lavena Bullion, DO;  [facility];  Service: Gastroenterology;;   C sections     x 7   COLONOSCOPY  2019   x2 At the [place] and in [place]   ENTEROSCOPY N/A 03/08/2019   Procedure: ENTEROSCOPY;  Surgeon: Lavena Bullion, DO;  [facility];  Service: Gastroenterology;  Laterality: N/A;   ENTEROSCOPY N/A 09/19/2021   Procedure: ENTEROSCOPY;  Surgeon: Yetta Flock, MD;  [facility];  Service: Gastroenterology;  Laterality: N/A;   HEMOSTASIS CLIP PLACEMENT  03/08/2019   Procedure: HEMOSTASIS CLIP PLACEMENT;  Surgeon: Lavena Bullion, DO;  [facility];  Service: Gastroenterology;;   HEMOSTASIS CLIP PLACEMENT  09/19/2021   Procedure: HEMOSTASIS CLIP PLACEMENT;  Surgeon: Yetta Flock, MD;  [facility];  Service: Gastroenterology;;   HOT HEMOSTASIS N/A 09/19/2021   Procedure: HOT HEMOSTASIS (ARGON PLASMA COAGULATION/BICAP);  Surgeon: Yetta Flock, MD;  [facility];  Service: Gastroenterology;  Laterality: N/A;   SUBMUCOSAL TATTOO INJECTION  03/08/2019   Procedure: SUBMUCOSAL TATTOO INJECTION;  Surgeon: Lavena Bullion, DO;  [facility];  Service: Gastroenterology;;   SUBMUCOSAL TATTOO INJECTION  09/19/2021   Procedure: SUBMUCOSAL TATTOO INJECTION;  Surgeon: Yetta Flock, MD;  [facility];  Service: Gastroenterology;;   TONSILLECTOMY     WISDOM TOOTH EXTRACTION     WRIST SURGERY      OB History   No obstetric history on  file.      Home Medications    Prior to Admission medications   Medication Sig Start Date End Date Taking? Authorizing Provider  cetirizine (ZYRTEC) 10 MG tablet Take 10 mg by mouth at bedtime.   Yes [provider]  Cyanocobalamin (VITAMIN B-12 PO) Take 1 tablet by mouth in the morning.   Yes [provider]  losartan (COZAAR) 25 MG tablet Take 25 mg by mouth in the morning.   Yes [provider]  magnesium oxide (MAG-OX) 400 (240 Mg) MG tablet Take 1 tablet (400 mg total) by mouth daily. Patient taking differently: Take 400 mg by mouth in the morning. 02/11/21  Yes Orson Slick, MD  metFORMIN (GLUCOPHAGE) 1000 MG tablet Take 1,000 mg by mouth 2 (two) times daily with a meal.   Yes [provider]  Multiple Vitamins-Minerals (HAIR/SKIN/NAILS) CAPS Take 1 capsule by mouth in the morning.   Yes [provider]  Multiple Vitamins-Minerals (ONE-A-DAY WOMENS 50+) TABS Take 1 tablet by mouth in the morning.   Yes [provider]  omeprazole (PRILOSEC) 40 MG capsule Take 40 mg by mouth in the morning. 09/16/21  Yes [provider]  simvastatin (ZOCOR) 40 MG tablet Take 1 tablet (40 mg total) by mouth daily at 6 PM. Patient taking differently: Take 40 mg by mouth at bedtime. 04/06/19  Yes Libby Maw, MD  SYNTHROID 100 MCG tablet Take 100 mcg by mouth daily before breakfast.   Yes [provider]  traZODone (DESYREL) 100 MG tablet TAKE 1 TABLET BY MOUTH AT BEDTIME Patient taking differently: Take 100 mg by mouth at bedtime. 09/20/19  Yes Libby Maw, MD  venlafaxine XR (EFFEXOR-XR) 37.5 MG 24 hr capsule Take 37.5 mg by mouth in the morning.   Yes [provider]  VICTOZA 18 MG/3ML SOPN Inject 1.2 mg into the skin in the morning. 09/10/21  Yes [provider]    Family History Family History  Problem Relation Age of Onset   Multiple myeloma Mother 58   Heart attack Father    Lung  cancer Sister 22       smoking hx   Leukemia Paternal Aunt        dx before 61   Cancer Paternal Aunt        unknown type; dx after 56   Colon cancer Paternal Aunt        dx after 1   Head & neck cancer Paternal Uncle        dx before 52   Cancer Paternal Uncle        unknown type; dx after 53   Cancer Paternal Uncle        color or gastric; dx after 10    Social History Social History   Tobacco Use   Smoking status: Never   Smokeless tobacco: Never  Vaping Use   Vaping Use: Never used  Substance Use Topics   Alcohol use: Not Currently   Drug use: Never     Allergies  Lidocaine and Macrobid [nitrofurantoin]   Review of Systems Review of Systems  As per HPI  Physical Exam Triage Vital Signs ED Triage Vitals  Enc Vitals Group     BP 12/20/21 1650 (!) 150/85     Pulse Rate 12/20/21 1650 84     Resp 12/20/21 1650 16     Temp 12/20/21 1650 98.2 F (36.8 C)     Temp Source 12/20/21 1650 Oral     SpO2 12/20/21 1650 99 %     Weight --      Height --      Head Circumference --      Peak Flow --      Pain Score 12/20/21 1651 3     Pain Loc --      Pain Edu? --      Excl. in [place]? --    No data found.  Updated Vital Signs BP 135/84   Pulse 89   Temp 98.2 F (36.8 C) (Oral)   Resp 18   SpO2 98%   Physical Exam Vitals and nursing note reviewed.  Constitutional:      General: She is not in acute distress.    Appearance: Normal appearance.  HENT:     Mouth/Throat:     Pharynx: Oropharynx is clear.  Eyes:     General: No scleral icterus.    Conjunctiva/sclera: Conjunctivae normal.  Cardiovascular:     Rate and Rhythm: Normal rate and regular rhythm.     Pulses: Normal pulses.     Heart sounds: Normal heart sounds.  Pulmonary:     Effort: Pulmonary effort is normal.     Breath sounds: Normal breath sounds.  Abdominal:     General: Bowel sounds are normal.     Palpations: There is no mass.     Tenderness: There is no abdominal tenderness. There  is no right CVA tenderness, left CVA tenderness or guarding.  Skin:    General: Skin is warm and dry.  Neurological:     Mental Status: She is alert and oriented to person, place, and time.     UC Treatments / Results  Labs (all labs ordered are listed, but only abnormal results are displayed) Labs Reviewed  POCT URINALYSIS DIPSTICK, ED / UC - Abnormal; Notable for the following components:      Result Value   Leukocytes,Ua TRACE (*)    All other components within normal limits    EKG  Radiology No results found.  Procedures Procedures   Medications Ordered in UC Medications - No data to display  Initial Impression / Assessment and Plan / UC Course  I have reviewed the triage vital signs and the nursing notes.  Pertinent labs & imaging results that were available during my care of the patient were reviewed by me and considered in my medical decision making (see chart for details).  She is well appearing with stable vitals.  Urine with trace leuks Bladder pressure likely related to ovarian cyst. No other signs of urinary tract infection Abdomen non tender. She had 1 episode of diarrhea. No other signs or symptoms. Reassurance provided, low concern for GI bleed at this time. Dicussed signs to monitor. Return precautions discussed. Patient agrees to plan  Final Clinical Impressions(s) / UC Diagnoses   Final diagnoses:  Worried well     Discharge Instructions      Please monitor for any change or worsening in symptoms.  You are welcome to return as needed.  Follow  up with your primary care provider if needed.    ED Prescriptions   None    PDMP not reviewed this encounter.   Les Pou, [place] 12/21/21 4514

## 2021-12-20 NOTE — ED Triage Notes (Signed)
Hx of 2 upper GI bleeds in past; states this morning had "that awful smelling sludge"-type diarrhea. Denies any visible blood in stool. Reports hx of stomach issues with both diarrhea and constipation. Pt denies any abd pain. C/O bladder pressure, polyuria, and urinary urgency but denies any dysuria. Also reports ovarian cyst - surgery scheduled 12/30/21.

## 2021-12-20 NOTE — Discharge Instructions (Signed)
Please monitor for any change or worsening in symptoms.  You are welcome to return as needed.  Follow up with your primary care provider if needed.

## 2021-12-22 NOTE — H&P (Signed)
Dana Nielsen is an 76 y.o. postmenopausal G7P7 who is admitted for Laparoscopic bilateral salpingo-oophorectomy for right ovarian cyst and Hysteroscopy D&C with possible Myosure polypectomy for endometrial mass and thickened endometrium.  Patient has a long-standing history of a known right ovarian cyst which was initially noted ~20 years ago. The right ovarian cyst has remained stable in size (previously ~5.5cm in size). She desired yearly surveillance. This year, the cyst has grown ~1cm in size. The patient has experienced some recent weight loss and can feel the cyst -- it is bothersome but not painful. She is concerned she could have a cyst rupture at some point and would like it removed. She had a recent hospitalization for an intestinal bleed and her Hemoglobin dropped to 8. She does not accept most blood products and this made her decide to have the cyst removed so she can avoid cyst rupture/bleeding from it. Patient is a Sales promotion account executive witness. She understands that refusal of blood products when necessary can lead to death - she accepts this risk.  On patient's ultrasound to evaluate the cyst, a 0.7cm hyperechoic mass was noted at the fundus on the endometrium. She would like this addressed as well while under anesthesia for bilateral oophorectomy.  Work-up: TVUS (11/23/21):        Uterus 8.34 x 3.83 x 5.28cm Endometrial thickness 0.57cm Right ovary 7.47cm Anteverted uterus. No uterine anomalies seen. Endometrium thickened fundally with a quesitonable hyperechoic mass -- 0.6 x 0.4cm, stable in size from previous US 11/20/20, no blood flow noted. Right ovayr - simple cyst 6.7 x 5.9 x 5.6cm, slightly increased in size from previous US 11/20/20, otherwise stable, avascular. Left ovary not visualized. No adnexal masses seen.  ROMA (11/2020): Low risk for malignancy (CA125 4.5  HE4 74.8)  TVUS (11/24/2020): Uterus 8.10 x 3.51 x 4.74cm Endometrial thickness 0.41cm Right ovary 6.90cm Anteverted  uterus. No uterine anomalies seen. Endometrium thickened fundally with questionable hyperechoic mass - 0.7 x 0.4cm, avascular. Right ovary - simple cyst - 6.5 x 5.7 x 5.2, increased in size from previous US 11/20/19; avascular. Left ovary not visualized. No adnexal masses seen.  TVUS (11/21/19): Uterus 6.42 x 3.23 x 4.96cm Endometrial thickness 0.39cm Right ovary 6.46cm  Left ovary 2.89cm No uterine anomalies seen. Endometrium thin. Right ovary - simple cyst 5.5 x 5.4 x 5.0cm, avascular. Left ovary - wnl. No adnexal masses seen.  Patient Active Problem List   Diagnosis Date Noted   Hypomagnesemia 09/20/2021   Occult GI bleeding 09/19/2021   GI bleed 09/19/2021   Family history of lung cancer 12/09/2020   Family history of colon cancer 12/09/2020   Family history of multiple myeloma 12/09/2020   Seasonal allergic rhinitis due to pollen 04/06/2019   Arterial hypotension 03/20/2019   Labyrinthitis 03/20/2019   Iron deficiency anemia due to chronic blood loss 03/09/2019   Dieulafoy lesion of duodenum    Gastric ulcer without hemorrhage or perforation    Gastritis and gastroduodenitis    Hiatal hernia    Upper GI bleed 03/07/2019   Anemia 03/07/2019   Melena 03/05/2019   Primary insomnia 12/14/2018   Refusal of blood transfusions as patient is Jehovah's Witness 12/14/2018   Iron deficiency anemia 12/14/2018   Hypothyroidism 12/14/2018   Depression, major, single episode, mild ([place]) 12/14/2018   Controlled type 2 diabetes mellitus with complication, without long-term current use of insulin ([place]) 12/14/2018   Essential hypertension 12/14/2018   Vitamin D deficiency 12/14/2018   Elevated cholesterol 12/14/2018   MEDICAL/FAMILY/SOCIAL HX:  No LMP recorded. Patient is postmenopausal.    Past Medical History:  Diagnosis Date   Diabetes mellitus type 2, diet-controlled ([place])    Family history of colon cancer 12/09/2020   Family history of lung cancer 12/09/2020   Family history of  multiple myeloma 12/09/2020   HLD (hyperlipidemia)    Hypothyroidism    Iron deficiency anemia    Ovarian cyst    Upper GI bleed     Past Surgical History:  Procedure Laterality Date   arthroscopic knee     BIOPSY  03/08/2019   Procedure: BIOPSY;  Surgeon: Lavena Bullion, DO;  Location: [facility];  Service: Gastroenterology;;   C sections     x 7   COLONOSCOPY  2019   x2 At the [facility] and in [place]   ENTEROSCOPY N/A 03/08/2019   Procedure: ENTEROSCOPY;  Surgeon: Lavena Bullion, DO;  Location: [facility];  Service: Gastroenterology;  Laterality: N/A;   ENTEROSCOPY N/A 09/19/2021   Procedure: ENTEROSCOPY;  Surgeon: Yetta Flock, MD;  Location: [facility];  Service: Gastroenterology;  Laterality: N/A;   HEMOSTASIS CLIP PLACEMENT  03/08/2019   Procedure: HEMOSTASIS CLIP PLACEMENT;  Surgeon: Lavena Bullion, DO;  Location: [facility];  Service: Gastroenterology;;   HEMOSTASIS CLIP PLACEMENT  09/19/2021   Procedure: HEMOSTASIS CLIP PLACEMENT;  Surgeon: Yetta Flock, MD;  Location: [facility];  Service: Gastroenterology;;   HOT HEMOSTASIS N/A 09/19/2021   Procedure: HOT HEMOSTASIS (ARGON PLASMA COAGULATION/BICAP);  Surgeon: Yetta Flock, MD;  Location: [facility];  Service: Gastroenterology;  Laterality: N/A;   SUBMUCOSAL TATTOO INJECTION  03/08/2019   Procedure: SUBMUCOSAL TATTOO INJECTION;  Surgeon: Lavena Bullion, DO;  Location: [facility];  Service: Gastroenterology;;   SUBMUCOSAL TATTOO INJECTION  09/19/2021   Procedure: SUBMUCOSAL TATTOO INJECTION;  Surgeon: Yetta Flock, MD;  Location: [facility];  Service: Gastroenterology;;   TONSILLECTOMY     WISDOM TOOTH EXTRACTION     WRIST SURGERY      Family History  Problem Relation Age of Onset   Multiple myeloma Mother 76   Heart attack Father    Lung cancer Sister 71       smoking hx   Leukemia Paternal Aunt        dx before 22   Cancer Paternal Aunt        unknown  type; dx after 50   Colon cancer Paternal Aunt        dx after 14   Head & neck cancer Paternal Uncle        dx before 49   Cancer Paternal Uncle        unknown type; dx after 75   Cancer Paternal Uncle        color or gastric; dx after 80    Social History:  reports that she has never smoked. She has never used smokeless tobacco. She reports that she does not currently use alcohol. She reports that she does not use drugs.  ALLERGIES/MEDS:  Allergies:  Allergies  Allergen Reactions   Lidocaine Shortness Of Breath   Macrobid [Nitrofurantoin] Other (See Comments)    Headache Confusion  Dizziness Myalgias     No medications prior to admission.     Review of Systems  Constitutional: Negative.   HENT: Negative.    Eyes: Negative.   Respiratory: Negative.    Cardiovascular: Negative.   Gastrointestinal: Negative.   Genitourinary: Negative.   Musculoskeletal: Negative.   Skin: Negative.   Neurological:  Negative.   Endo/Heme/Allergies: Negative.   Psychiatric/Behavioral: Negative.      There were no vitals taken for this visit. Gen:  NAD, pleasant and cooperative Cardio:  RRR Pulm:  CTAB, no wheezes/rales/rhonchi Abd:  Soft, non-distended, non-tender throughout, no rebound/guarding Ext:  No bilateral LE edema, no bilateral calf tenderness, SCDs on an working   No results found for this or any previous visit (from the past 24 hour(s)).  No results found.   ASSESSMENT/PLAN: Maudene Stotler is a 76 y.o. postmenopausal G7P7 who is admitted for Laparoscopic bilateral salpingo-oophorectomy for right ovarian cyst and Hysteroscopy D&C with possible Myosure polypectomy for endometrial mass and thickened endometrium.  - Admit to [facility] - Admit labs (CBC, T&S, COVID screen per protocol) - Diet:  Per anesthesia/ERAS pathway - IVF:  Per anesthesia - VTE Prophylaxis:  SCDs - Antibiotics: None - D/C home same-day  Consents: I have counseled the patient that this surgery  is performed to remove a pelvic mass through several small incisions in the abdomen. Prior to surgery, the risks and benefits of the surgery, as well as alternative treatments, have been discussed. The risks include, but are not limited to, bleeding, including the need for a blood transfusion, infection, damage to surrounding organs and tissues, damage to bladder, damage to ureters, causing kidney damage, and requiring additional procedures, damage to bowels, resulting in further surgery and/or a colostomy, postoperative pain, short-term and long-term, scarring on and inside the abdominal wall, need for an open procedure, development of an incisional hernia which could require surgery in the future, need for further surgery, deep vein thrombosis and/or pulmonary embolism, wound separation or infection, painful intercourse, urinary leakage, ovarian failure, resulting in menopausal symptoms requiring treatment, complications the course of which cannot be predicted or prevented, and death.  It was also discussed that there is a chance that the mass being removed is found to be cancer during or after your surgery.  There is a chance that cancer cells could be spread to other parts of your body during the surgery.  There is a chance that additional procedures will be performed to diagnose and treat a cancer fully.  There is a chance that cancer discovered after your surgery is over may need treatments in the future, including additional surgery. I discussed with the patient that this surgery is performed to look inside the uterus and remove the uterine lining.  Prior to surgery, the risks and benefits of the surgery, as well as alternative treatments, have been discussed.  The risks include, but are not limited to bleeding, including the need for a blood transfusion, infection, damage to organs and tissues, including uterine perforation, requiring additional surgery, postoperative pain, short-term and long-term, failure  of the procedure to control symptoms, need for hysterectomy to control bleeding, fluid overload, which could create electrolyte abnormalities and the need to stop the procedure before completion, inability to safely complete the procedure, deep vein thrombosis and/or pulmonary embolism, painful intercourse, complications the course of which cannot be predicted or prevented, and death.  Patient DECLINES blood transfusion and understands risk of death.  [name], DO

## 2021-12-29 ENCOUNTER — Encounter (HOSPITAL_BASED_OUTPATIENT_CLINIC_OR_DEPARTMENT_OTHER): Payer: Self-pay | Admitting: Obstetrics and Gynecology

## 2021-12-29 NOTE — Progress Notes (Signed)
Spoke w/ via phone for pre-op interview--- pt Lab needs dos---- cbc, istat              Lab results------ current EKG in epic/ chart COVID test -----patient states asymptomatic no test needed Arrive at -------  1030 on 12-30-2021 NPO after MN NO Solid Food.  Clear liquids from MN until--- 0930 Med rec completed Medications to take morning of surgery ----- synthroid, prilosec, effexor Diabetic medication ----- do not take metformin and do not do victoza morning pf surgery Patient instructed no nail polish to be worn day of surgery Patient instructed to bring photo id and insurance card day of surgery Patient aware to have Driver (ride ) / caregiver    for 24 hours after surgery -- son, Jeneen Rinks Patient Special Instructions ----- n/a Pre-Op special Istructions ----- n/a Patient verbalized understanding of instructions that were given at this phone interview. Patient denies shortness of breath, chest pain, fever, cough at this phone interview.

## 2021-12-30 ENCOUNTER — Encounter (HOSPITAL_BASED_OUTPATIENT_CLINIC_OR_DEPARTMENT_OTHER)
Admission: RE | Disposition: A | Discharge status: Home / Self Care | Payer: Self-pay | Source: Home / Self Care | Attending: Obstetrics and Gynecology

## 2021-12-30 ENCOUNTER — Other Ambulatory Visit: Payer: Self-pay

## 2021-12-30 ENCOUNTER — Encounter (HOSPITAL_BASED_OUTPATIENT_CLINIC_OR_DEPARTMENT_OTHER): Payer: Self-pay | Admitting: Obstetrics and Gynecology

## 2021-12-30 ENCOUNTER — Ambulatory Visit (HOSPITAL_BASED_OUTPATIENT_CLINIC_OR_DEPARTMENT_OTHER): Payer: Medicare HMO | Admitting: Anesthesiology

## 2021-12-30 ENCOUNTER — Ambulatory Visit (HOSPITAL_BASED_OUTPATIENT_CLINIC_OR_DEPARTMENT_OTHER)
Admission: RE | Admit: 2021-12-30 | Discharge: 2021-12-30 | Disposition: A | Discharge status: Home / Self Care | Payer: Medicare HMO | Attending: Obstetrics and Gynecology | Admitting: Obstetrics and Gynecology

## 2021-12-30 DIAGNOSIS — N840 Polyp of corpus uteri: Secondary | ICD-10-CM | POA: Diagnosis not present

## 2021-12-30 DIAGNOSIS — F32A Depression, unspecified: Secondary | ICD-10-CM | POA: Diagnosis not present

## 2021-12-30 DIAGNOSIS — Z01818 Encounter for other preprocedural examination: Secondary | ICD-10-CM

## 2021-12-30 DIAGNOSIS — D638 Anemia in other chronic diseases classified elsewhere: Secondary | ICD-10-CM

## 2021-12-30 DIAGNOSIS — Z6835 Body mass index (BMI) 35.0-35.9, adult: Secondary | ICD-10-CM | POA: Insufficient documentation

## 2021-12-30 DIAGNOSIS — Z780 Asymptomatic menopausal state: Secondary | ICD-10-CM | POA: Diagnosis not present

## 2021-12-30 DIAGNOSIS — Z7984 Long term (current) use of oral hypoglycemic drugs: Secondary | ICD-10-CM | POA: Diagnosis not present

## 2021-12-30 DIAGNOSIS — E039 Hypothyroidism, unspecified: Secondary | ICD-10-CM

## 2021-12-30 DIAGNOSIS — IMO0001 Reserved for inherently not codable concepts without codable children: Secondary | ICD-10-CM

## 2021-12-30 DIAGNOSIS — I10 Essential (primary) hypertension: Secondary | ICD-10-CM

## 2021-12-30 DIAGNOSIS — Z531 Procedure and treatment not carried out because of patient's decision for reasons of belief and group pressure: Secondary | ICD-10-CM

## 2021-12-30 DIAGNOSIS — E119 Type 2 diabetes mellitus without complications: Secondary | ICD-10-CM | POA: Insufficient documentation

## 2021-12-30 DIAGNOSIS — K219 Gastro-esophageal reflux disease without esophagitis: Secondary | ICD-10-CM | POA: Diagnosis not present

## 2021-12-30 DIAGNOSIS — N83201 Unspecified ovarian cyst, right side: Secondary | ICD-10-CM

## 2021-12-30 DIAGNOSIS — N9489 Other specified conditions associated with female genital organs and menstrual cycle: Secondary | ICD-10-CM

## 2021-12-30 DIAGNOSIS — M1990 Unspecified osteoarthritis, unspecified site: Secondary | ICD-10-CM | POA: Diagnosis not present

## 2021-12-30 DIAGNOSIS — D270 Benign neoplasm of right ovary: Secondary | ICD-10-CM | POA: Insufficient documentation

## 2021-12-30 HISTORY — DX: Complete loss of teeth, unspecified cause, unspecified class: K08.109

## 2021-12-30 HISTORY — DX: Unspecified osteoarthritis, unspecified site: M19.90

## 2021-12-30 HISTORY — DX: Personal history of other diseases of the digestive system: Z87.19

## 2021-12-30 HISTORY — DX: Presence of external hearing-aid: Z97.4

## 2021-12-30 HISTORY — DX: Other specified conditions associated with female genital organs and menstrual cycle: N94.89

## 2021-12-30 HISTORY — PX: DILATATION & CURETTAGE/HYSTEROSCOPY WITH MYOSURE: SHX6511

## 2021-12-30 HISTORY — DX: Gastro-esophageal reflux disease without esophagitis: K21.9

## 2021-12-30 HISTORY — DX: Personal history of peptic ulcer disease: Z87.11

## 2021-12-30 HISTORY — DX: Major depressive disorder, single episode, unspecified: F32.9

## 2021-12-30 HISTORY — DX: Type 2 diabetes mellitus without complications: E11.9

## 2021-12-30 HISTORY — DX: Unspecified ovarian cyst, right side: N83.201

## 2021-12-30 HISTORY — PX: LAPAROSCOPIC BILATERAL SALPINGO OOPHERECTOMY: SHX5890

## 2021-12-30 HISTORY — DX: Procedure and treatment not carried out because of patient's decision for reasons of belief and group pressure: Z53.1

## 2021-12-30 HISTORY — DX: Reserved for inherently not codable concepts without codable children: IMO0001

## 2021-12-30 HISTORY — DX: Presence of dental prosthetic device (complete) (partial): Z97.2

## 2021-12-30 LAB — POCT I-STAT, CHEM 8
BUN: 25 mg/dL — ABNORMAL HIGH (ref 8–23)
Calcium, Ion: 1.29 mmol/L (ref 1.15–1.40)
Chloride: 104 mmol/L (ref 98–111)
Creatinine, Ser: 0.9 mg/dL (ref 0.44–1.00)
Glucose, Bld: 127 mg/dL — ABNORMAL HIGH (ref 70–99)
HCT: 39 % (ref 36.0–46.0)
Hemoglobin: 13.3 g/dL (ref 12.0–15.0)
Potassium: 3.9 mmol/L (ref 3.5–5.1)
Sodium: 141 mmol/L (ref 135–145)
TCO2: 25 mmol/L (ref 22–32)

## 2021-12-30 LAB — CBC
HCT: 38.3 % (ref 36.0–46.0)
Hemoglobin: 11.8 g/dL — ABNORMAL LOW (ref 12.0–15.0)
MCH: 26.8 pg (ref 26.0–34.0)
MCHC: 30.8 g/dL (ref 30.0–36.0)
MCV: 86.8 fL (ref 80.0–100.0)
Platelets: 206 10*3/uL (ref 150–400)
RBC: 4.41 MIL/uL (ref 3.87–5.11)
RDW: 15.9 % — ABNORMAL HIGH (ref 11.5–15.5)
WBC: 6.5 10*3/uL (ref 4.0–10.5)
nRBC: 0 % (ref 0.0–0.2)

## 2021-12-30 LAB — NO BLOOD PRODUCTS

## 2021-12-30 LAB — GLUCOSE, CAPILLARY: Glucose-Capillary: 153 mg/dL — ABNORMAL HIGH (ref 70–99)

## 2021-12-30 SURGERY — SALPINGO-OOPHORECTOMY, BILATERAL, LAPAROSCOPIC
Anesthesia: General | Site: Vagina

## 2021-12-30 MED ORDER — CEFAZOLIN SODIUM-DEXTROSE 2-4 GM/100ML-% IV SOLN
2.0000 g | INTRAVENOUS | Status: AC
  Administered 2021-12-30: 2 g via INTRAVENOUS

## 2021-12-30 MED ORDER — LACTATED RINGERS IV SOLN
INTRAVENOUS | Status: DC
  Administered 2021-12-30: 1000 mL via INTRAVENOUS

## 2021-12-30 MED ORDER — SODIUM CHLORIDE 0.9 % IR SOLN
Status: DC | PRN
  Administered 2021-12-30: 200 mL

## 2021-12-30 MED ORDER — SODIUM CHLORIDE (PF) 0.9 % IJ SOLN
INTRAMUSCULAR | Status: AC
  Filled 2021-12-30: qty 10

## 2021-12-30 MED ORDER — DEXAMETHASONE SODIUM PHOSPHATE 10 MG/ML IJ SOLN
INTRAMUSCULAR | Status: AC
  Filled 2021-12-30: qty 1

## 2021-12-30 MED ORDER — ONDANSETRON HCL 4 MG/2ML IJ SOLN
INTRAMUSCULAR | Status: DC | PRN
  Administered 2021-12-30: 4 mg via INTRAVENOUS

## 2021-12-30 MED ORDER — IBUPROFEN 600 MG PO TABS: 600.0000 mg | ORAL_TABLET | Freq: Four times a day (QID) | ORAL | 1 refills | Status: DC | PRN

## 2021-12-30 MED ORDER — ACETAMINOPHEN 500 MG PO TABS
1000.0000 mg | ORAL_TABLET | Freq: Once | ORAL | Status: AC
  Administered 2021-12-30: 1000 mg via ORAL

## 2021-12-30 MED ORDER — ONDANSETRON HCL 4 MG/2ML IJ SOLN
INTRAMUSCULAR | Status: AC
  Filled 2021-12-30: qty 2

## 2021-12-30 MED ORDER — PROPOFOL 10 MG/ML IV BOLUS
INTRAVENOUS | Status: DC | PRN
  Administered 2021-12-30: 150 mg via INTRAVENOUS

## 2021-12-30 MED ORDER — OXYCODONE HCL 5 MG PO TABS
ORAL_TABLET | ORAL | Status: AC
  Filled 2021-12-30: qty 1

## 2021-12-30 MED ORDER — SODIUM CHLORIDE 0.9 % IR SOLN
Status: DC | PRN
  Administered 2021-12-30: 3000 mL

## 2021-12-30 MED ORDER — EPHEDRINE SULFATE (PRESSORS) 50 MG/ML IJ SOLN
INTRAMUSCULAR | Status: DC | PRN
  Administered 2021-12-30: 10 mg via INTRAVENOUS

## 2021-12-30 MED ORDER — FENTANYL CITRATE (PF) 100 MCG/2ML IJ SOLN
INTRAMUSCULAR | Status: AC
  Filled 2021-12-30: qty 2

## 2021-12-30 MED ORDER — EPHEDRINE 5 MG/ML INJ
INTRAVENOUS | Status: AC
  Filled 2021-12-30: qty 5

## 2021-12-30 MED ORDER — CEFAZOLIN SODIUM 1 G IJ SOLR
INTRAMUSCULAR | Status: AC
  Filled 2021-12-30: qty 10

## 2021-12-30 MED ORDER — KETOROLAC TROMETHAMINE 30 MG/ML IJ SOLN
INTRAMUSCULAR | Status: AC
  Filled 2021-12-30: qty 1

## 2021-12-30 MED ORDER — ACETAMINOPHEN 500 MG PO TABS
ORAL_TABLET | ORAL | Status: AC
  Filled 2021-12-30: qty 2

## 2021-12-30 MED ORDER — OXYCODONE HCL 5 MG PO TABS: 5.0000 mg | ORAL_TABLET | Freq: Four times a day (QID) | ORAL | 0 refills | Status: DC | PRN

## 2021-12-30 MED ORDER — PROPOFOL 500 MG/50ML IV EMUL
INTRAVENOUS | Status: AC
  Filled 2021-12-30: qty 50

## 2021-12-30 MED ORDER — FENTANYL CITRATE (PF) 100 MCG/2ML IJ SOLN
25.0000 ug | INTRAMUSCULAR | Status: DC | PRN
  Administered 2021-12-30 (×2): 25 ug via INTRAVENOUS

## 2021-12-30 MED ORDER — SUGAMMADEX SODIUM 200 MG/2ML IV SOLN
INTRAVENOUS | Status: DC | PRN
  Administered 2021-12-30: 200 mg via INTRAVENOUS

## 2021-12-30 MED ORDER — SILVER NITRATE-POT NITRATE 75-25 % EX MISC
CUTANEOUS | Status: AC
  Filled 2021-12-30: qty 10

## 2021-12-30 MED ORDER — SILVER NITRATE-POT NITRATE 75-25 % EX MISC
CUTANEOUS | Status: DC | PRN
  Administered 2021-12-30: 2

## 2021-12-30 MED ORDER — FLUORESCEIN SODIUM 10 % IV SOLN
INTRAVENOUS | Status: AC
  Filled 2021-12-30: qty 5

## 2021-12-30 MED ORDER — FLUORESCEIN SODIUM 10 % IV SOLN
INTRAVENOUS | Status: DC | PRN
  Administered 2021-12-30: 10 mg via INTRAVENOUS

## 2021-12-30 MED ORDER — SODIUM CHLORIDE 0.9 % IR SOLN
Status: DC | PRN
  Administered 2021-12-30: 400 mL

## 2021-12-30 MED ORDER — DEXAMETHASONE SODIUM PHOSPHATE 4 MG/ML IJ SOLN
INTRAMUSCULAR | Status: DC | PRN
  Administered 2021-12-30: 4 mg via INTRAVENOUS

## 2021-12-30 MED ORDER — FENTANYL CITRATE (PF) 100 MCG/2ML IJ SOLN
INTRAMUSCULAR | Status: DC | PRN
  Administered 2021-12-30 (×3): 50 ug via INTRAVENOUS

## 2021-12-30 MED ORDER — PHENYLEPHRINE HCL (PRESSORS) 10 MG/ML IV SOLN
INTRAVENOUS | Status: DC | PRN
  Administered 2021-12-30: 80 ug via INTRAVENOUS

## 2021-12-30 MED ORDER — PHENYLEPHRINE 80 MCG/ML (10ML) SYRINGE FOR IV PUSH (FOR BLOOD PRESSURE SUPPORT)
PREFILLED_SYRINGE | INTRAVENOUS | Status: AC
  Filled 2021-12-30: qty 10

## 2021-12-30 MED ORDER — ROCURONIUM BROMIDE 100 MG/10ML IV SOLN
INTRAVENOUS | Status: DC | PRN
  Administered 2021-12-30 (×2): 10 mg via INTRAVENOUS
  Administered 2021-12-30: 50 mg via INTRAVENOUS

## 2021-12-30 MED ORDER — DEXMEDETOMIDINE HCL IN NACL 200 MCG/50ML IV SOLN
INTRAVENOUS | Status: DC | PRN
  Administered 2021-12-30: 8 ug via INTRAVENOUS

## 2021-12-30 MED ORDER — LIDOCAINE HCL (PF) 2 % IJ SOLN
INTRAMUSCULAR | Status: AC
  Filled 2021-12-30: qty 5

## 2021-12-30 SURGICAL SUPPLY — 46 items
APL SRG 38 LTWT LNG FL B (MISCELLANEOUS)
APL SWBSTK 6 STRL LF DISP (MISCELLANEOUS) ×2
APPLICATOR ARISTA FLEXITIP XL (MISCELLANEOUS) IMPLANT
APPLICATOR COTTON TIP 6 STRL (MISCELLANEOUS) IMPLANT
APPLICATOR COTTON TIP 6IN STRL (MISCELLANEOUS) ×2
CABLE HIGH FREQUENCY MONO STRZ (ELECTRODE) IMPLANT
CATH ROBINSON RED A/P 16FR (CATHETERS) ×2 IMPLANT
DEVICE MYOSURE LITE (MISCELLANEOUS) IMPLANT
DEVICE MYOSURE REACH (MISCELLANEOUS) IMPLANT
DILATOR CANAL MILEX (MISCELLANEOUS) IMPLANT
DRSG OPSITE POSTOP 3X4 (GAUZE/BANDAGES/DRESSINGS) IMPLANT
DRSG TELFA 3X8 NADH STRL (GAUZE/BANDAGES/DRESSINGS) ×2 IMPLANT
GAUZE 4X4 16PLY ~~LOC~~+RFID DBL (SPONGE) ×2 IMPLANT
GLOVE BIO SURGEON STRL SZ 6.5 (GLOVE) ×4 IMPLANT
GLOVE BIOGEL M 6.5 STRL (GLOVE) ×2 IMPLANT
GLOVE BIOGEL PI IND STRL 6.5 (GLOVE) ×2 IMPLANT
GLOVE BIOGEL PI IND STRL 7.0 (GLOVE) ×4 IMPLANT
GOWN STRL REUS W/TWL LRG LVL3 (GOWN DISPOSABLE) ×4 IMPLANT
HEMOSTAT ARISTA ABSORB 3G PWDR (HEMOSTASIS) IMPLANT
KIT PROCEDURE FLUENT (KITS) ×2 IMPLANT
KIT TURNOVER CYSTO (KITS) ×2 IMPLANT
LIGASURE VESSEL 5MM BLUNT TIP (ELECTROSURGICAL) IMPLANT
NS IRRIG 1000ML POUR BTL (IV SOLUTION) ×2 IMPLANT
PACK LAPAROSCOPY BASIN (CUSTOM PROCEDURE TRAY) ×2 IMPLANT
PACK TRENDGUARD 450 HYBRID PRO (MISCELLANEOUS) IMPLANT
PACK VAGINAL MINOR WOMEN LF (CUSTOM PROCEDURE TRAY) ×2 IMPLANT
PAD OB MATERNITY 4.3X12.25 (PERSONAL CARE ITEMS) ×2 IMPLANT
PROTECTOR NERVE ULNAR (MISCELLANEOUS) ×4 IMPLANT
SEAL ROD LENS SCOPE MYOSURE (ABLATOR) ×2 IMPLANT
SET SUCTION IRRIG HYDROSURG (IRRIGATION / IRRIGATOR) ×2 IMPLANT
SET TUBE SMOKE EVAC HIGH FLOW (TUBING) ×2 IMPLANT
SLEEVE ADV FIXATION 5X100MM (TROCAR) ×2 IMPLANT
SOLUTION ELECTROLUBE (MISCELLANEOUS) IMPLANT
SUT VIC AB 4-0 PS2 18 (SUTURE) IMPLANT
SUT VICRYL 0 UR6 27IN ABS (SUTURE) IMPLANT
SUT VICRYL 4-0 PS2 18IN ABS (SUTURE) ×2 IMPLANT
SYS BAG RETRIEVAL 10MM (BASKET) ×4
SYSTEM BAG RETRIEVAL 10MM (BASKET) IMPLANT
TOWEL OR 17X26 10 PK STRL BLUE (TOWEL DISPOSABLE) ×4 IMPLANT
TRAP SPECIMEN MUCUS 40CC (MISCELLANEOUS) IMPLANT
TRAY FOLEY W/BAG SLVR 14FR (SET/KITS/TRAYS/PACK) ×2 IMPLANT
TRENDGUARD 450 HYBRID PRO PACK (MISCELLANEOUS) ×2
TROCAR Z-THREAD FIOS 11X100 BL (TROCAR) IMPLANT
TROCAR Z-THREAD FIOS 5X100MM (TROCAR) ×2 IMPLANT
UNDERPAD 30X36 HEAVY ABSORB (UNDERPADS AND DIAPERS) ×2 IMPLANT
WARMER LAPAROSCOPE (MISCELLANEOUS) ×2 IMPLANT

## 2021-12-30 NOTE — Anesthesia Postprocedure Evaluation (Signed)
Anesthesia Post Note  Patient: Dana Nielsen  Procedure(s) Performed: LAPAROSCOPIC BILATERAL SALPINGO OOPHORECTOMY (Bilateral: Abdomen) DILATATION & CURETTAGE/HYSTEROSCOPY WITH MYOSURE (Vagina )     Patient location during evaluation: PACU Anesthesia Type: General Level of consciousness: awake and alert Pain management: pain level controlled Vital Signs Assessment: post-procedure vital signs reviewed and stable Respiratory status: spontaneous breathing, nonlabored ventilation, respiratory function stable and patient connected to nasal cannula oxygen Cardiovascular status: blood pressure returned to baseline and stable Postop Assessment: no apparent nausea or vomiting Anesthetic complications: no  No notable events documented.  Last Vitals:  Vitals:   12/30/21 1620 12/30/21 1630  BP:  108/62  Pulse: 85 78  Resp: 14 13  Temp:    SpO2: 94% 93%    Last Pain:  Vitals:   12/30/21 1626  TempSrc:   PainSc: 4                  [name] L [name]

## 2021-12-30 NOTE — Interval H&P Note (Signed)
History and Physical Interval Note:  12/30/2021 12:07 PM  Dana Nielsen  has presented today for surgery, with the diagnosis of Right Ovarian Cyst and endometrial mass.  The various methods of treatment have been discussed with the patient and family. After consideration of risks, benefits and other options for treatment, the patient has consented to  Procedure(s): LAPAROSCOPIC BILATERAL SALPINGO OOPHORECTOMY (Bilateral) [place] (N/A) as a surgical intervention.  The patient's history has been reviewed, patient examined, no change in status, stable for surgery.  I have reviewed the patient's chart and labs.  Questions were answered to the patient's satisfaction.     [name]

## 2021-12-30 NOTE — Anesthesia Preprocedure Evaluation (Addendum)
Anesthesia Evaluation  Patient identified by MRN, date of birth, ID band Patient awake    Reviewed: Allergy & Precautions, H&P , NPO status , Patient's Chart, lab work & pertinent test results  Airway Mallampati: II  TM Distance: >3 FB Neck ROM: Full    Dental no notable dental hx. (+) Edentulous Upper, Edentulous Lower, Dental Advisory Given   Pulmonary neg pulmonary ROS   Pulmonary exam normal breath sounds clear to auscultation       Cardiovascular hypertension,  Rhythm:Regular Rate:Normal     Neuro/Psych    Depression    negative neurological ROS     GI/Hepatic Neg liver ROS, hiatal hernia, PUD,GERD  Medicated,,  Endo/Other  diabetes, Type 2, Oral Hypoglycemic AgentsHypothyroidism  Morbid obesity  Renal/GU negative Renal ROS  negative genitourinary   Musculoskeletal  (+) Arthritis , Osteoarthritis,    Abdominal   Peds  Hematology  (+) Blood dyscrasia, anemia , JEHOVAH'S WITNESS  Anesthesia Other Findings   Reproductive/Obstetrics negative OB ROS                             Anesthesia Physical Anesthesia Plan  ASA: 3  Anesthesia Plan: General   Post-op Pain Management: Tylenol PO (pre-op)*   Induction: Intravenous  PONV Risk Score and Plan: 4 or greater and Ondansetron, Dexamethasone and Treatment may vary due to age or medical condition  Airway Management Planned: Oral ETT  Additional Equipment:   Intra-op Plan:   Post-operative Plan: Extubation in OR  Informed Consent: I have reviewed the patients History and Physical, chart, labs and discussed the procedure including the risks, benefits and alternatives for the proposed anesthesia with the patient or authorized representative who has indicated his/her understanding and acceptance.     Dental advisory given  Plan Discussed with: CRNA  Anesthesia Plan Comments:        Anesthesia Quick Evaluation

## 2021-12-30 NOTE — Anesthesia Procedure Notes (Signed)
Procedure Name: Intubation Date/Time: 12/30/2021 1:42 PM  Performed by: Georgeanne Nim, CRNAPre-anesthesia Checklist: Emergency Drugs available, Suction available, Patient being monitored and Timeout performed Patient Re-evaluated:Patient Re-evaluated prior to induction Oxygen Delivery Method: Circle system utilized Preoxygenation: Pre-oxygenation with 100% oxygen Induction Type: IV induction Ventilation: Mask ventilation without difficulty Laryngoscope Size: Mac and 4 Grade View: Grade I Number of attempts: 1 Airway Equipment and Method: Stylet Placement Confirmation: ETT inserted through vocal cords under direct vision, positive ETCO2 and breath sounds checked- equal and bilateral Secured at: 21 cm Tube secured with: Tape Dental Injury: Teeth and Oropharynx as per pre-operative assessment

## 2021-12-30 NOTE — Transfer of Care (Signed)
Immediate Anesthesia Transfer of Care Note  Patient: Dana Nielsen  Procedure(s) Performed: LAPAROSCOPIC BILATERAL SALPINGO OOPHORECTOMY (Bilateral: Abdomen) DILATATION & CURETTAGE/HYSTEROSCOPY WITH MYOSURE (Vagina )  Patient Location: PACU  Anesthesia Type:General  Level of Consciousness: awake and patient cooperative  Airway & Oxygen Therapy: Patient Spontanous Breathing and Patient connected to nasal cannula oxygen  Post-op Assessment: Report given to RN and Post -op Vital signs reviewed and stable  Post vital signs: Reviewed and stable  Last Vitals:  Vitals Value Taken Time  BP 139/59 12/30/21 1531  Temp    Pulse 89 12/30/21 1532  Resp 19 12/30/21 1532  SpO2 94 % 12/30/21 1532  Vitals shown include unvalidated device data.  Last Pain:  Vitals:   12/30/21 1124  TempSrc: Oral  PainSc: 0-No pain      Patients Stated Pain Goal: 5 (71/24/58 0998)  Complications: No notable events documented.

## 2021-12-30 NOTE — Interval H&P Note (Signed)
History and Physical Interval Note:  12/30/2021 12:07 PM  Dana Nielsen  has presented today for surgery, with the diagnosis of Right Ovarian Cyst and endometrial mass.  The various methods of treatment have been discussed with the patient and family. After consideration of risks, benefits and other options for treatment, the patient has consented to  Procedure(s): LAPAROSCOPIC BILATERAL SALPINGO OOPHORECTOMY (Bilateral) [place] (N/A) as a surgical intervention.  The patient's history has been reviewed, patient examined, no change in status, stable for surgery.  I have reviewed the patient's chart and labs.  Questions were answered to the patient's satisfaction.     [name]

## 2021-12-30 NOTE — Discharge Instructions (Signed)
  Post Anesthesia Home Care Instructions  Activity: Get plenty of rest for the remainder of the day. A responsible individual must stay with you for 24 hours following the procedure.  For the next 24 hours, DO NOT: -Drive a car -Paediatric nurse -Drink alcoholic beverages -Take any medication unless instructed by your physician -Make any legal decisions or sign important papers.  Meals: Start with liquid foods such as gelatin or soup. Progress to regular foods as tolerated. Avoid greasy, spicy, heavy foods. If nausea and/or vomiting occur, drink only clear liquids until the nausea and/or vomiting subsides. Call your physician if vomiting continues.  Special Instructions/Symptoms: Your throat may feel dry or sore from the anesthesia or the breathing tube placed in your throat during surgery. If this causes discomfort, gargle with warm salt water. The discomfort should disappear within 24 hours.  Do not take any Tylenol until after 6:00 pm today, if needed.

## 2021-12-30 NOTE — Op Note (Addendum)
Pre Op Dx:   1. Right ovarian cyst 2. Endometrial mass  Post Op Dx:   1. Right ovarian cyst 2. Endometrial polyps  Procedure:   1. Peritoneal washings 2. Laparoscopic bilateral salpingo-oophorectomy 3. Hysteroscopy with Dilation and Curettage with Myosure polypectomy 4. Cystoscopy   Surgeon:  Dr. [name] Assistants:  Dr. Christophe Louis Anesthesia:  General   EBL:  5cc  IVF:  1200cc UOP:  200cc clear yellow urine Fluid Deficit:  60cc   Drains:  Foley catheter Specimen removed:  Bilateral fallopian tubes, ovaries, and endometrial curettings -- sent to pathology  Pelvic washings -- sent to cytology Device(s) implanted: None Case Type:  Clean-contaminated Findings:  Normal-appearing cervix. Cervical stenosis. Normal-appearing uterus. Right ovary containing ~7cm serous cyst. Normal-appearing left ovary. Evidence of prior bilateral tubal ligation. Right ureter visualized intraabdominally. Unable to visualize left ureter intraabdominally. Omentum and peritoneum with dark lesions throughout that appeared consistent with infarcted small vessels. Normal-appearing liver contours. On Hysteroscopy, there were two broad-based endometrial polyps. Endometrium otherwise appeared atrophic. Bilateral tubal ostia visualized. On Cystoscopy, brisk bilateral ureteral jets were visualized. Complications: None Indications:  76 y.o. postmenopausal G7P7 with increasing size in right ovarian cyst and endometrial mass noted on ultrasound who desired definitive surgical management.  Description of each procedure:  After informed consent the patient was taken to the operating room where general endotracheal anesthesia was administered and found to be adequate. She was placed in dorsal lithotomy position. She was prepped and draped in the usual sterile fashion. An operative timeout was performed. A Hulka tenaculum was placed in the cervix after dilating the cervix and her bladder was drained. The abdomen was  entered through an intraumbilical incision using a 7m trocar under direct visualization and a pneumoperitoneum was established. Bilateral lower quadrant ports were placed under direct visualization. The entry point was visualized from the inferior port and atraumatic entry confirmed. The findings as above was noted. The Ligasure device was used to ligate the uteroovarian anastomosis and the infundibulopelvic ligament. This process was completed on the contralateral side. The bilateral fimbriae were attached to each ovary. Each specimen was placed in an Endocatch bag and removed through the 177mport site. The large cyst contained serous fluid, contained within the Endocatch bag. Adequate hemostasis noted. Pneumoperitoneum was reduced and the ports were withdrawn under visualization.  The 1047mort site fascia was closed in an open technique using 0-Vicryl. The skin was closed with 4-0 Vicryl in subcuticular fashion. The Hulka tenaculum was removed.  Attention was then turned to the hysteroscopic portion of the procedure. IV fluorescein was administered. The anterior lip of the cervix was grasped with a single-tooth tenaculum and the cervix was serially dilated to accommodate the hysteroscope.  The hysteroscope was advanced and the findings as above was noted. The Myosure Reach was used to resect the endometrial polyps and sample the endometrium. The single-tooth tenaculum was removed and its sites were made hemostatic with silver nitrate.  Adequate hemostasis was noted. Foley catheter was removed. Cystoscopy was performed and demonstrated intact urothelium throughout the bladder, a normal-appearing trigone and bilaterally patent ureteral orifices with normal urine jets noted  The patient was awakened and extubated and appeared to have tolerated the procedure well.  All counts were correct.  Disposition:  PACU  Comments: Assistance was required due to complexity of the anatomy.  MelDrema DallasO

## 2022-01-01 ENCOUNTER — Encounter (HOSPITAL_BASED_OUTPATIENT_CLINIC_OR_DEPARTMENT_OTHER): Payer: Self-pay | Admitting: Obstetrics and Gynecology

## 2022-01-01 ENCOUNTER — Telehealth: Payer: Self-pay | Admitting: *Deleted

## 2022-01-01 LAB — SURGICAL PATHOLOGY

## 2022-01-01 LAB — CYTOLOGY - NON PAP

## 2022-01-01 NOTE — Telephone Encounter (Signed)
Received call from pt. She states she had surgery this week to remove an ovarian cyst. She is feeling ok. She states she missed her lab appt because of that. She states her HGB was stable '@the'$  time of surgery.  She is requesting lab appt on 01/06/22. '@pm'$  is agreeable to her. Scheduling message sent

## 2022-01-06 ENCOUNTER — Inpatient Hospital Stay: Payer: Medicare HMO | Attending: Hematology and Oncology

## 2022-01-06 ENCOUNTER — Other Ambulatory Visit: Payer: Self-pay

## 2022-01-06 DIAGNOSIS — D500 Iron deficiency anemia secondary to blood loss (chronic): Secondary | ICD-10-CM | POA: Diagnosis not present

## 2022-01-06 LAB — CBC WITH DIFFERENTIAL (CANCER CENTER ONLY)
Abs Immature Granulocytes: 0.04 K/uL (ref 0.00–0.07)
Basophils Absolute: 0.1 K/uL (ref 0.0–0.1)
Basophils Relative: 1 %
Eosinophils Absolute: 0.2 K/uL (ref 0.0–0.5)
Eosinophils Relative: 3 %
HCT: 38.4 % (ref 36.0–46.0)
Hemoglobin: 12.3 g/dL (ref 12.0–15.0)
Immature Granulocytes: 1 %
Lymphocytes Relative: 32 %
Lymphs Abs: 2.4 K/uL (ref 0.7–4.0)
MCH: 27.5 pg (ref 26.0–34.0)
MCHC: 32 g/dL (ref 30.0–36.0)
MCV: 85.9 fL (ref 80.0–100.0)
Monocytes Absolute: 0.7 K/uL (ref 0.1–1.0)
Monocytes Relative: 10 %
Neutro Abs: 4 K/uL (ref 1.7–7.7)
Neutrophils Relative %: 53 %
Platelet Count: 240 K/uL (ref 150–400)
RBC: 4.47 MIL/uL (ref 3.87–5.11)
RDW: 16.3 % — ABNORMAL HIGH (ref 11.5–15.5)
WBC Count: 7.3 K/uL (ref 4.0–10.5)
nRBC: 0 % (ref 0.0–0.2)

## 2022-01-07 ENCOUNTER — Telehealth: Payer: Self-pay | Admitting: *Deleted

## 2022-01-07 NOTE — Telephone Encounter (Signed)
-----   Message from Orson Slick, MD sent at 01/07/2022  9:58 AM EST ----- Please let Ms. Grills know that her hemoglobin is steady at 12.3.  There is no need for intervention at this time. ----- Message ----- From: Buel Ream, Lab [facility] [place] Sent: 01/06/2022   2:12 PM EST To: Orson Slick, MD

## 2022-01-07 NOTE — Telephone Encounter (Signed)
TCT patient regarding recent lab results. Spoke with her and advised that her HGB is stable at 12.3  Pt very pleased with this. She expected it to be lower since her recent surgery.   She is aware of her next appt with Dr, Lorenso Courier on 02/01/22

## 2022-01-21 ENCOUNTER — Encounter: Payer: Self-pay | Admitting: Physician Assistant

## 2022-01-21 ENCOUNTER — Encounter: Payer: Self-pay | Admitting: Hematology and Oncology

## 2022-01-28 ENCOUNTER — Ambulatory Visit: Payer: Medicare HMO | Admitting: Podiatry

## 2022-01-28 ENCOUNTER — Ambulatory Visit (INDEPENDENT_AMBULATORY_CARE_PROVIDER_SITE_OTHER): Payer: Medicare HMO | Admitting: Podiatry

## 2022-01-28 ENCOUNTER — Encounter: Payer: Self-pay | Admitting: Podiatry

## 2022-01-28 DIAGNOSIS — M2042 Other hammer toe(s) (acquired), left foot: Secondary | ICD-10-CM

## 2022-01-28 DIAGNOSIS — E118 Type 2 diabetes mellitus with unspecified complications: Secondary | ICD-10-CM

## 2022-01-28 DIAGNOSIS — M2041 Other hammer toe(s) (acquired), right foot: Secondary | ICD-10-CM | POA: Diagnosis not present

## 2022-01-28 DIAGNOSIS — Z6835 Body mass index (BMI) 35.0-35.9, adult: Secondary | ICD-10-CM | POA: Insufficient documentation

## 2022-01-28 DIAGNOSIS — E119 Type 2 diabetes mellitus without complications: Secondary | ICD-10-CM

## 2022-01-28 DIAGNOSIS — Z794 Long term (current) use of insulin: Secondary | ICD-10-CM | POA: Insufficient documentation

## 2022-01-28 DIAGNOSIS — E1165 Type 2 diabetes mellitus with hyperglycemia: Secondary | ICD-10-CM | POA: Insufficient documentation

## 2022-01-28 NOTE — Progress Notes (Signed)
Subjective: Dana Nielsen presents today referred by Layla Barter, MD for diabetic foot evaluation.  Patient relates many year history of diabetes.  Patient denies any history of foot wounds.  Patient denies any history of numbness, tingling, burning, pins/needles sensations.  Past Medical History:  Diagnosis Date   Endometrial mass    Family history of colon cancer 12/09/2020   Family history of lung cancer 12/09/2020   Family history of multiple myeloma 12/09/2020   Full dentures    GERD (gastroesophageal reflux disease)    H/O: upper GI bleed    las time 09-19-2021  s/p EGD with bleeding Dielufoy lesion of distal duodedum tx'd APC and clips;  previous gi bleed 03-08-2019  s/p EGD w/ hemastatis and clips   History of gastric ulcer    w/ upper GI bleed 03-08-2019 gastric ulcer nonbleeding and duodunal bleeding lesion   HLD (hyperlipidemia)    Hypothyroidism    followed by pcp   Iron deficiency anemia    hematologist--- dr Lenna Sciara. [place] [facility];   due to hx GI bleed and blood product refusal   MDD (major depressive disorder)    OA (osteoarthritis)    hands, knees   Ovarian cyst, right    Refusal of blood transfusions as patient is Jehovah's Witness    Type 2 diabetes mellitus ([place])    followed by pcp  (12-29-2021  per pt check blood sugar daily in am fasting, average 105--110)   Wears hearing aid in both ears     Patient Active Problem List   Diagnosis Date Noted   Body mass index (BMI) 35.0-35.9, adult 01/28/2022   Hyperglycemia due to type 2 diabetes mellitus ([place]) 01/28/2022   Long term (current) use of insulin ([place]) 01/28/2022   Hypomagnesemia 09/20/2021   Occult GI bleeding 09/19/2021   GI bleed 09/19/2021   Family history of lung cancer 12/09/2020   Family history of colon cancer 12/09/2020   Family history of multiple myeloma 12/09/2020   Seasonal allergic rhinitis due to pollen 04/06/2019   Arterial hypotension 03/20/2019   Labyrinthitis 03/20/2019   Iron  deficiency anemia due to chronic blood loss 03/09/2019   Dieulafoy lesion of duodenum    Gastric ulcer without hemorrhage or perforation    Gastritis and gastroduodenitis    Hiatal hernia    Upper GI bleed 03/07/2019   Anemia 03/07/2019   Melena 03/05/2019   Primary insomnia 12/14/2018   Refusal of blood transfusions as patient is Jehovah's Witness 12/14/2018   Iron deficiency anemia 12/14/2018   Hypothyroidism 12/14/2018   Depression, major, single episode, mild ([employer]) 12/14/2018   Controlled type 2 diabetes mellitus with complication, without long-term current use of insulin ([place]) 12/14/2018   Essential hypertension 12/14/2018   Vitamin D deficiency 12/14/2018   Elevated cholesterol 12/14/2018   Cyst of right ovary 03/22/2018   Depression 02/10/2018   Osteoarthritis 02/10/2018   Neck pain 05/24/2017   Low back pain 11/17/2016   Dyslipidemia 10/23/2013    Past Surgical History:  Procedure Laterality Date   BIOPSY  03/08/2019   Procedure: BIOPSY;  Surgeon: Lavena Bullion, DO;  Location: [facility];  Service: Gastroenterology;;   COLONOSCOPY  2019   x2 At the [facility] and in [place] N/A 12/30/2021   Procedure: [place];  Surgeon: [name], DO;  Location: [facility];  Service: Gynecology;  Laterality: N/A;   DILATION AND CURETTAGE OF UTERUS     yrs  ago   ENTEROSCOPY N/A 03/08/2019   Procedure: ENTEROSCOPY;  Surgeon: Lavena Bullion, DO;  Location: [facility];  Service: Gastroenterology;  Laterality: N/A;   ENTEROSCOPY N/A 09/19/2021   Procedure: ENTEROSCOPY;  Surgeon: Yetta Flock, MD;  Location: [facility];  Service: Gastroenterology;  Laterality: N/A;   HEMOSTASIS CLIP PLACEMENT  03/08/2019   Procedure: HEMOSTASIS CLIP PLACEMENT;  Surgeon: Lavena Bullion, DO;  Location: [facility];  Service: Gastroenterology;;   HEMOSTASIS CLIP  PLACEMENT  09/19/2021   Procedure: HEMOSTASIS CLIP PLACEMENT;  Surgeon: Yetta Flock, MD;  Location: [facility];  Service: Gastroenterology;;   HOT HEMOSTASIS N/A 09/19/2021   Procedure: HOT HEMOSTASIS (ARGON PLASMA COAGULATION/BICAP);  Surgeon: Yetta Flock, MD;  Location: [facility];  Service: Gastroenterology;  Laterality: N/A;   KNEE ARTHROSCOPY Left 2003   LAPAROSCOPIC BILATERAL SALPINGO OOPHERECTOMY Bilateral 12/30/2021   Procedure: LAPAROSCOPIC BILATERAL SALPINGO OOPHORECTOMY;  Surgeon: [name], DO;  Location: [facility];  Service: Gynecology;  Laterality: Bilateral;   SUBMUCOSAL TATTOO INJECTION  03/08/2019   Procedure: SUBMUCOSAL TATTOO INJECTION;  Surgeon: Lavena Bullion, DO;  Location: [facility];  Service: Gastroenterology;;   SUBMUCOSAL TATTOO INJECTION  09/19/2021   Procedure: SUBMUCOSAL TATTOO INJECTION;  Surgeon: Yetta Flock, MD;  Location: [facility];  Service: Gastroenterology;;   TONSILLECTOMY     child   TUBAL LIGATION Bilateral 1990   WISDOM TOOTH EXTRACTION     WRIST FRACTURE SURGERY Right 2013   per pt for crush injury,  no hardware    Current Outpatient Medications on File Prior to Visit  Medication Sig Dispense Refill   hydrOXYzine (ATARAX) 25 MG tablet Take 25 mg by mouth 2 (two) times daily.     cetirizine (ZYRTEC) 10 MG tablet Take 10 mg by mouth at bedtime.     Cyanocobalamin (VITAMIN B-12 PO) Take 1 tablet by mouth in the morning.     furosemide (LASIX) 20 MG tablet Take 1 tablet by mouth once daily for 90     glucose blood (ACCU-CHEK AVIVA PLUS) test strip Use to test your blood sugar In Vitro twice a day     ibuprofen (ADVIL) 600 MG tablet Take 1 tablet (600 mg total) by mouth every 6 (six) hours as needed for moderate pain or mild pain. 30 tablet 1   losartan (COZAAR) 25 MG tablet Take 25 mg by mouth in the morning.     magnesium oxide (MAG-OX) 400 (240 Mg) MG tablet Take 1 tablet (400 mg total)  by mouth daily. (Patient taking differently: Take 400 mg by mouth in the morning.) 30 tablet 1   metFORMIN (GLUCOPHAGE) 1000 MG tablet Take 1,000 mg by mouth 2 (two) times daily with a meal.     Multiple Vitamins-Minerals (HAIR/SKIN/NAILS) CAPS Take 1 capsule by mouth in the morning.     Multiple Vitamins-Minerals (ONE-A-DAY WOMENS 50+) TABS Take 1 tablet by mouth in the morning.     omeprazole (PRILOSEC) 40 MG capsule Take 40 mg by mouth in the morning.     oxyCODONE (OXY IR/ROXICODONE) 5 MG immediate release tablet Take 1 tablet (5 mg total) by mouth every 6 (six) hours as needed for severe pain or breakthrough pain. 12 tablet 0   pantoprazole (PROTONIX) 40 MG tablet Take 1 tablet by mouth twice daily for 90     simvastatin (ZOCOR) 40 MG tablet Take 1 tablet (40 mg total) by mouth daily at 6 PM. (Patient taking differently: Take 40 mg by mouth at  bedtime.) 90 tablet 3   SYNTHROID 100 MCG tablet Take 100 mcg by mouth daily before breakfast.     traZODone (DESYREL) 100 MG tablet TAKE 1 TABLET BY MOUTH AT BEDTIME (Patient taking differently: Take 100 mg by mouth at bedtime.) 90 tablet 0   venlafaxine XR (EFFEXOR-XR) 37.5 MG 24 hr capsule Take 37.5 mg by mouth in the morning.     VICTOZA 18 MG/3ML SOPN Inject 1.8 mg into the skin in the morning.     No current facility-administered medications on file prior to visit.     Allergies  Allergen Reactions   Lidocaine Shortness Of Breath    Per pt had wrist injection w/ lidocaine,  stated sob w/ throat closing   Macrobid [Nitrofurantoin] Other (See Comments)    Headache Confusion  Dizziness Myalgias     Social History   Occupational History   Not on file  Tobacco Use   Smoking status: Never   Smokeless tobacco: Never  Vaping Use   Vaping Use: Never used  Substance and Sexual Activity   Alcohol use: Not Currently   Drug use: Never   Sexual activity: Not on file    Family History  Problem Relation Age of Onset   Multiple myeloma  Mother 49   Heart attack Father    Lung cancer Sister 19       smoking hx   Leukemia Paternal Aunt        dx before 66   Cancer Paternal Aunt        unknown type; dx after 60   Colon cancer Paternal Aunt        dx after 56   Head & neck cancer Paternal Uncle        dx before 51   Cancer Paternal Uncle        unknown type; dx after 3   Cancer Paternal Uncle        color or gastric; dx after 60    Immunization History  Administered Date(s) Administered   Influenza,inj,Quad PF,6+ Mos 10/29/2019   Influenza-Unspecified 11/10/2018   PFIZER(Purple Top)SARS-COV-2 Vaccination 02/16/2019, 03/27/2019, 10/22/2019    Review of systems: Positive Findings in bold print.  Constitutional:  chills, fatigue, fever, sweats, weight change Communication: Optometrist, sign Ecologist, hand writing, iPad/Android device Head: headaches, head injury Eyes: changes in vision, eye pain, glaucoma, cataracts, macular degeneration, diplopia, glare,  light sensitivity, eyeglasses or contacts, blindness Ears nose mouth throat: hearing impaired, hearing aids,  ringing in ears, deaf, sign language,  vertigo, nosebleeds,  rhinitis,  cold sores, snoring, swollen glands Cardiovascular: HTN, edema, arrhythmia, pacemaker in place, defibrillator in place, chest pain/tightness, chronic anticoagulation, blood clot, heart failure, MI Peripheral Vascular: leg cramps, varicose veins, blood clots, lymphedema, varicosities Respiratory:  asthma, difficulty breathing, denies congestion, SOB, wheezing, cough, emphysema Gastrointestinal: change in appetite or weight, abdominal pain, constipation, diarrhea, nausea, vomiting, vomiting blood, change in bowel habits, abdominal pain, jaundice, rectal bleeding, hemorrhoids, GERD Genitourinary:  nocturia,  pain on urination, polyuria,  blood in urine, Foley catheter, urinary urgency, ESRD on hemodialysis Musculoskeletal: amputation, cramping, stiff joints, painful joints,  decreased joint motion, fractures, OA, gout, hemiplegia, paraplegia, uses cane, wheelchair bound, uses walker, uses rollator Skin: +changes in toenails, color change, dryness, itching, mole changes,  rash, wound(s) Neurological: headaches, numbness in feet, paresthesias in feet, burning in feet, fainting,  seizures, change in speech, migraines, memory problems/poor historian, cerebral palsy, weakness, paralysis, CVA, TIA Endocrine: diabetes, hypothyroidism, hyperthyroidism,  goiter, dry mouth,  flushing, heat intolerance, cold intolerance,  excessive thirst, denies polyuria,  nocturia Hematological:  easy bleeding, excessive bleeding, easy bruising, enlarged lymph nodes, on long term blood thinner, history of past transusions Allergy/immunological:  hives, eczema, frequent infections, multiple drug allergies, seasonal allergies, transplant recipient, multiple food allergies Psychiatric:  anxiety, depression, mood disorder, suicidal ideations, hallucinations, insomnia  Objective: There were no vitals filed for this visit. Vascular Examination: Capillary refill time less than 5 seconds x 10 digits.  Dorsalis pedis pulses palpable 2 out of 4.  Posterior tibial pulses palpable 2 out of 4.  Digital hair not present x 10 digits.  Skin temperature gradient WNL b/l.  Dermatological Examination: Skin with normal turgor, texture and tone b/l  Toenails 1-5 b/l discolored, thick, dystrophic with subungual debris and pain with palpation to nailbeds due to thickness of nails.  Musculoskeletal: Muscle strength 5/5 to all LE muscle groups.  Neurological: Sensation intact with 10 gram monofilament.  Vibratory sensation intact.  Assessment: NIDDM Encounter for diabetic foot examination Hammertoe bilateral  Plan: Discussed diabetic foot care principles. Literature dispensed on today. Patient to continue soft, supportive shoe gear daily. Patient to report any pedal injuries to medical  professional immediately. Follow up one year. Patient/POA to call should there be a concern in the interim. Given that patient has hammertoe presentation bilateral in setting of diabetes I believe patient would benefit from diabetic shoes.  She will be scheduled for diabetic shoes

## 2022-01-28 NOTE — Progress Notes (Unsigned)
Patient presents to the office today for diabetic shoe and insole measuring.  Patient was measured with brannock device to determine size and width for 1 pair of extra depth shoes and foam casted for 3 pair of insoles.   ABN signed.   Documentation of medical necessity will be sent to patient's treating diabetic doctor to verify and sign.   Patient's diabetic provider: Layla Barter, MD   Shoes and insoles will be ordered at that time and patient will be notified for an appointment for fitting when they arrive.   Brannock measurement: 8 W  Patient shoe selection-   1st   Shoe choice:   [id number]  Shoe size ordered: 8 W

## 2022-01-29 ENCOUNTER — Telehealth: Payer: Self-pay | Admitting: Podiatry

## 2022-01-29 NOTE — Telephone Encounter (Signed)
Patient left message on voicemail - to check to see if we received her paperwork back for her diabetic shoes.     We did receive paperwork, tried to call pt back , she does not have voicemail .

## 2022-02-01 ENCOUNTER — Other Ambulatory Visit: Payer: Self-pay

## 2022-02-01 ENCOUNTER — Encounter: Payer: Self-pay | Admitting: Hematology and Oncology

## 2022-02-01 ENCOUNTER — Other Ambulatory Visit: Payer: Self-pay | Admitting: Hematology and Oncology

## 2022-02-01 ENCOUNTER — Inpatient Hospital Stay: Payer: Medicare HMO | Attending: Hematology and Oncology

## 2022-02-01 ENCOUNTER — Encounter: Payer: Self-pay | Admitting: Physician Assistant

## 2022-02-01 ENCOUNTER — Inpatient Hospital Stay (HOSPITAL_BASED_OUTPATIENT_CLINIC_OR_DEPARTMENT_OTHER): Payer: Medicare HMO | Admitting: Hematology and Oncology

## 2022-02-01 VITALS — BP 133/84 | HR 87 | Temp 97.7°F | Resp 17 | Wt 191.3 lb

## 2022-02-01 DIAGNOSIS — Z806 Family history of leukemia: Secondary | ICD-10-CM | POA: Diagnosis not present

## 2022-02-01 DIAGNOSIS — Z807 Family history of other malignant neoplasms of lymphoid, hematopoietic and related tissues: Secondary | ICD-10-CM | POA: Diagnosis not present

## 2022-02-01 DIAGNOSIS — Z801 Family history of malignant neoplasm of trachea, bronchus and lung: Secondary | ICD-10-CM | POA: Diagnosis not present

## 2022-02-01 DIAGNOSIS — E119 Type 2 diabetes mellitus without complications: Secondary | ICD-10-CM | POA: Insufficient documentation

## 2022-02-01 DIAGNOSIS — D500 Iron deficiency anemia secondary to blood loss (chronic): Secondary | ICD-10-CM | POA: Insufficient documentation

## 2022-02-01 DIAGNOSIS — Z800 Family history of malignant neoplasm of digestive organs: Secondary | ICD-10-CM | POA: Insufficient documentation

## 2022-02-01 LAB — RETIC PANEL
Immature Retic Fract: 13.8 % (ref 2.3–15.9)
RBC.: 4.48 MIL/uL (ref 3.87–5.11)
Retic Count, Absolute: 81.1 10*3/uL (ref 19.0–186.0)
Retic Ct Pct: 1.8 % (ref 0.4–3.1)
Reticulocyte Hemoglobin: 30.8 pg (ref >27.9)

## 2022-02-01 LAB — CMP (CANCER CENTER ONLY)
ALT: 16 U/L (ref 0–44)
AST: 16 U/L (ref 15–41)
Albumin: 4.3 g/dL (ref 3.5–5.0)
Alkaline Phosphatase: 66 U/L (ref 38–126)
Anion gap: 7 (ref 5–15)
BUN: 16 mg/dL (ref 8–23)
CO2: 29 mmol/L (ref 22–32)
Calcium: 10.2 mg/dL (ref 8.9–10.3)
Chloride: 105 mmol/L (ref 98–111)
Creatinine: 0.89 mg/dL (ref 0.44–1.00)
GFR, Estimated: >60 mL/min (ref >60)
Glucose, Bld: 101 mg/dL — ABNORMAL HIGH (ref 70–99)
Potassium: 4.7 mmol/L (ref 3.5–5.1)
Sodium: 141 mmol/L (ref 135–145)
Total Bilirubin: 0.4 mg/dL (ref 0.3–1.2)
Total Protein: 7.2 g/dL (ref 6.5–8.1)

## 2022-02-01 LAB — CBC WITH DIFFERENTIAL (CANCER CENTER ONLY)
Abs Immature Granulocytes: 0.04 10*3/uL (ref 0.00–0.07)
Basophils Absolute: 0.1 10*3/uL (ref 0.0–0.1)
Basophils Relative: 1 %
Eosinophils Absolute: 0.1 10*3/uL (ref 0.0–0.5)
Eosinophils Relative: 2 %
HCT: 38.1 % (ref 36.0–46.0)
Hemoglobin: 12.4 g/dL (ref 12.0–15.0)
Immature Granulocytes: 1 %
Lymphocytes Relative: 28 %
Lymphs Abs: 1.9 10*3/uL (ref 0.7–4.0)
MCH: 27.9 pg (ref 26.0–34.0)
MCHC: 32.5 g/dL (ref 30.0–36.0)
MCV: 85.6 fL (ref 80.0–100.0)
Monocytes Absolute: 0.5 10*3/uL (ref 0.1–1.0)
Monocytes Relative: 8 %
Neutro Abs: 4.1 10*3/uL (ref 1.7–7.7)
Neutrophils Relative %: 60 %
Platelet Count: 234 10*3/uL (ref 150–400)
RBC: 4.45 MIL/uL (ref 3.87–5.11)
RDW: 16.5 % — ABNORMAL HIGH (ref 11.5–15.5)
WBC Count: 6.7 10*3/uL (ref 4.0–10.5)
nRBC: 0 % (ref 0.0–0.2)

## 2022-02-01 LAB — IRON AND IRON BINDING CAPACITY (CC-WL,HP ONLY)
Iron: 61 ug/dL (ref 28–170)
Saturation Ratios: 16 % (ref 10.4–31.8)
TIBC: 382 ug/dL (ref 250–450)
UIBC: 321 ug/dL (ref 148–442)

## 2022-02-01 LAB — MAGNESIUM: Magnesium: 1.3 mg/dL — ABNORMAL LOW (ref 1.7–2.4)

## 2022-02-01 NOTE — Progress Notes (Signed)
[place] [phone]   [phone]  PROGRESS NOTE  Patient Care Team: Layla Barter, MD as PCP - General  Hematological/Oncological History #Iron Deficiency Anemia 2/2 to GI Bleeding 1) 2019: presented to [facility] in [facility] with symptoms of anemia. Found to have Hgb 5.0. EGD/colon/capsule reported found no source of bleed 2) 01/18/2019: establish care with Dr. Lorenso Courier  3) 03/08/2019: admitted for acute GI bleed. Endoscopy revealed an actively bleeding Dieulafoy lesion that was clipped. Received IV feraheme '510mg'$  x 1 dose during admission 4) 03/15/2019: received dose 2 of IV feraheme '510mg'$  in outpatient setting. 5) 04/13/2019: WBC 8.6, Hgb 12.2, MCV 92.1, Plt 216 6) 07/19/2019: WBC 7.1, Hgb 12.2, MCV 90.6, Plt 226 7) 09/20/2021-09/21/2021: IV Ferrleciti 250 mg x 2 doses 8) 10/15/2021-1012/2023: IV venofer 200 mg x 2 doses  Interval History:  Dana Nielsen 77 y.o. female with medical history significant for Dieulafoy lesion bleeding with iron deficiency anemia who presents for a follow up visit. The patient's last visit was on 10/27/2021. In the interim since the last visit she has had a laparoscopic bilateral salpingo-oophorectomy and myosure polypectomy on 12/202/2023.   On exam today Dana Nielsen reports he had her ovarian cyst removal on 12/30/2021.  She reports that she was delighted to hear that her hemoglobin had actually increased after the surgery.  She does that she feels much better having the cyst removed as it was causing uncomfortable pressure and pulling at times.  She is also worried that it may rupture.  She reports that since that time she has had no issues with bleeding, bruising, or dark stools.  She notes that her energy today is "much much better".  She notes that her appetite is strong though she is somewhat of a picky eater.  She recently celebrated her 35th birthday.  She has had no recent infections of any kind.  She notes that  she recently had some changes in her medications and is interested in starting up on a weight loss medication as well.  Patient denies fevers, chills, night sweats, shortness of breath, chest pain or cough.  She has no other complaints. A full 10 point ROS is listed below.  MEDICAL HISTORY:  Past Medical History:  Diagnosis Date   Endometrial mass    Family history of colon cancer 12/09/2020   Family history of lung cancer 12/09/2020   Family history of multiple myeloma 12/09/2020   Full dentures    GERD (gastroesophageal reflux disease)    H/O: upper GI bleed    las time 09-19-2021  s/p EGD with bleeding Dielufoy lesion of distal duodedum tx'd APC and clips;  previous gi bleed 03-08-2019  s/p EGD w/ hemastatis and clips   History of gastric ulcer    w/ upper GI bleed 03-08-2019 gastric ulcer nonbleeding and duodunal bleeding lesion   HLD (hyperlipidemia)    Hypothyroidism    followed by pcp   Iron deficiency anemia    hematologist--- dr Lenna Sciara. Lorenso Courier;   due to hx GI bleed and blood product refusal   MDD (major depressive disorder)    OA (osteoarthritis)    hands, knees   Ovarian cyst, right    Refusal of blood transfusions as patient is Jehovah's Witness    Type 2 diabetes mellitus ([place])    followed by pcp  (12-29-2021  per pt check blood sugar daily in am fasting, average 105--110)   Wears hearing aid in both ears     SURGICAL  HISTORY: Past Surgical History:  Procedure Laterality Date   BIOPSY  03/08/2019   Procedure: BIOPSY;  Surgeon: Lavena Bullion, DO;  Location: [facility];  Service: Gastroenterology;;   COLONOSCOPY  2019   x2 At the [place] and in [place] N/A 12/30/2021   Procedure: [place];  Surgeon: [name], DO;  Location: [place];  Service: Gynecology;  Laterality: N/A;   DILATION AND CURETTAGE OF UTERUS     yrs ago   ENTEROSCOPY N/A  03/08/2019   Procedure: ENTEROSCOPY;  Surgeon: Lavena Bullion, DO;  Location: [facility];  Service: Gastroenterology;  Laterality: N/A;   ENTEROSCOPY N/A 09/19/2021   Procedure: ENTEROSCOPY;  Surgeon: Yetta Flock, MD;  Location: [facility];  Service: Gastroenterology;  Laterality: N/A;   HEMOSTASIS CLIP PLACEMENT  03/08/2019   Procedure: HEMOSTASIS CLIP PLACEMENT;  Surgeon: Lavena Bullion, DO;  Location: [facility];  Service: Gastroenterology;;   HEMOSTASIS CLIP PLACEMENT  09/19/2021   Procedure: HEMOSTASIS CLIP PLACEMENT;  Surgeon: Yetta Flock, MD;  Location: [facility];  Service: Gastroenterology;;   HOT HEMOSTASIS N/A 09/19/2021   Procedure: HOT HEMOSTASIS (ARGON PLASMA COAGULATION/BICAP);  Surgeon: Yetta Flock, MD;  Location: [facility];  Service: Gastroenterology;  Laterality: N/A;   KNEE ARTHROSCOPY Left 2003   LAPAROSCOPIC BILATERAL SALPINGO OOPHERECTOMY Bilateral 12/30/2021   Procedure: LAPAROSCOPIC BILATERAL SALPINGO OOPHORECTOMY;  Surgeon: [name], DO;  Location: [place];  Service: Gynecology;  Laterality: Bilateral;   SUBMUCOSAL TATTOO INJECTION  03/08/2019   Procedure: SUBMUCOSAL TATTOO INJECTION;  Surgeon: Lavena Bullion, DO;  Location: [facility];  Service: Gastroenterology;;   SUBMUCOSAL TATTOO INJECTION  09/19/2021   Procedure: SUBMUCOSAL TATTOO INJECTION;  Surgeon: Yetta Flock, MD;  Location: [facility];  Service: Gastroenterology;;   TONSILLECTOMY     child   TUBAL LIGATION Bilateral 1990   WISDOM TOOTH EXTRACTION     WRIST FRACTURE SURGERY Right 2013   per pt for crush injury,  no hardware    SOCIAL HISTORY: Social History   Socioeconomic History   Marital status: Widowed    Spouse name: Not on file   Number of children: 9   Years of education: Not on file   Highest education level: Not on file  Occupational History   Not on file  Tobacco Use   Smoking status: Never    Smokeless tobacco: Never  Vaping Use   Vaping Use: Never used  Substance and Sexual Activity   Alcohol use: Not Currently   Drug use: Never   Sexual activity: Not on file  Other Topics Concern   Not on file  Social History Narrative   Not on file   Social Determinants of Health   Financial Resource Strain: Not on file  Food Insecurity: Not on file  Transportation Needs: Not on file  Physical Activity: Not on file  Stress: Not on file  Social Connections: Not on file  Intimate Partner Violence: Not on file    FAMILY HISTORY: Family History  Problem Relation Age of Onset   Multiple myeloma Mother 60   Heart attack Father    Lung cancer Sister 39       smoking hx   Leukemia Paternal Aunt        dx before 76   Cancer Paternal Aunt        unknown type; dx after 90   Colon cancer Paternal Aunt  dx after 50   Head & neck cancer Paternal Uncle        dx before 17   Cancer Paternal Uncle        unknown type; dx after 60   Cancer Paternal Uncle        color or gastric; dx after 50    ALLERGIES:  is allergic to lidocaine and macrobid [nitrofurantoin].  MEDICATIONS:  Current Outpatient Medications  Medication Sig Dispense Refill   cetirizine (ZYRTEC) 10 MG tablet Take 10 mg by mouth at bedtime.     Cyanocobalamin (VITAMIN B-12 PO) Take 1 tablet by mouth in the morning.     furosemide (LASIX) 20 MG tablet Take 1 tablet by mouth once daily for 90     glucose blood (ACCU-CHEK AVIVA PLUS) test strip Use to test your blood sugar In Vitro twice a day     losartan (COZAAR) 25 MG tablet Take 25 mg by mouth in the morning.     magnesium oxide (MAG-OX) 400 (240 Mg) MG tablet Take 1 tablet (400 mg total) by mouth daily. (Patient taking differently: Take 400 mg by mouth in the morning.) 30 tablet 1   metFORMIN (GLUCOPHAGE) 1000 MG tablet Take 1,000 mg by mouth 2 (two) times daily with a meal.     Multiple Vitamins-Minerals (HAIR/SKIN/NAILS) CAPS Take 1 capsule by mouth in the  morning.     Multiple Vitamins-Minerals (ONE-A-DAY WOMENS 50+) TABS Take 1 tablet by mouth in the morning.     omeprazole (PRILOSEC) 40 MG capsule Take 40 mg by mouth in the morning.     oxyCODONE (OXY IR/ROXICODONE) 5 MG immediate release tablet Take 1 tablet (5 mg total) by mouth every 6 (six) hours as needed for severe pain or breakthrough pain. 12 tablet 0   simvastatin (ZOCOR) 40 MG tablet Take 1 tablet (40 mg total) by mouth daily at 6 PM. (Patient taking differently: Take 40 mg by mouth at bedtime.) 90 tablet 3   SYNTHROID 100 MCG tablet Take 100 mcg by mouth daily before breakfast.     traZODone (DESYREL) 100 MG tablet TAKE 1 TABLET BY MOUTH AT BEDTIME (Patient taking differently: Take 100 mg by mouth at bedtime.) 90 tablet 0   venlafaxine XR (EFFEXOR-XR) 37.5 MG 24 hr capsule Take 37.5 mg by mouth in the morning.     VICTOZA 18 MG/3ML SOPN Inject 1.8 mg into the skin in the morning.     No current facility-administered medications for this visit.    REVIEW OF SYSTEMS:   Constitutional: ( - ) fevers, ( - )  chills , ( - ) night sweats Eyes: ( - ) blurriness of vision, ( - ) double vision, ( - ) watery eyes Ears, nose, mouth, throat, and face: ( - ) mucositis, ( - ) sore throat Respiratory: ( - ) cough, ( - ) dyspnea, ( - ) wheezes Cardiovascular: ( - ) palpitation, ( - ) chest discomfort, ( - ) lower extremity swelling Gastrointestinal:  ( - ) nausea, ( - ) heartburn, ( - ) change in bowel habits Skin: ( - ) abnormal skin rashes Lymphatics: ( - ) new lymphadenopathy, ( - ) easy bruising Neurological: ( - ) numbness, ( - ) tingling, ( - ) new weaknesses Behavioral/Psych: ( - ) mood change, ( - ) new changes  All other systems were reviewed with the patient and are negative.  PHYSICAL EXAMINATION: ECOG PERFORMANCE STATUS: 1 - Symptomatic but completely ambulatory  Vitals:  02/01/22 1510  BP: 133/84  Pulse: 87  Resp: 17  Temp: 97.7 F (36.5 C)  SpO2: 97%    Filed Weights    02/01/22 1510  Weight: 191 lb 4.8 oz (86.8 kg)     GENERAL:well appearing elderly Caucasian female alert, no distress and comfortable SKIN: skin color, texture, turgor are normal, no rashes or significant lesions EYES: conjunctiva are pink and non-injected, sclera clear LUNGS: clear to auscultation and percussion with normal breathing effort HEART: regular rate & rhythm and no murmurs and no lower extremity edema Musculoskeletal: no cyanosis of digits and no clubbing  PSYCH: alert & oriented x 3, fluent speech NEURO: no focal motor/sensory deficits  LABORATORY DATA:  I have reviewed the data as listed    Latest Ref Rng & Units 02/01/2022    2:49 PM 01/06/2022    1:58 PM 12/30/2021   11:47 AM  CBC  WBC 4.0 - 10.5 K/uL 6.7  7.3    Hemoglobin 12.0 - 15.0 g/dL 12.4  12.3  13.3   Hematocrit 36.0 - 46.0 % 38.1  38.4  39.0   Platelets 150 - 400 K/uL 234  240         Latest Ref Rng & Units 02/01/2022    2:49 PM 12/30/2021   11:47 AM 11/06/2021    2:23 PM  CMP  Glucose 70 - 99 mg/dL 101  127  135   BUN 8 - 23 mg/dL '16  25  17   '$ Creatinine 0.44 - 1.00 mg/dL 0.89  0.90  0.96   Sodium 135 - 145 mmol/L 141  141  139   Potassium 3.5 - 5.1 mmol/L 4.7  3.9  4.1   Chloride 98 - 111 mmol/L 105  104  103   CO2 22 - 32 mmol/L 29   27   Calcium 8.9 - 10.3 mg/dL 10.2   10.1   Total Protein 6.5 - 8.1 g/dL 7.2   8.0   Total Bilirubin 0.3 - 1.2 mg/dL 0.4   0.3   Alkaline Phos 38 - 126 U/L 66   59   AST 15 - 41 U/L 16   13   ALT 0 - 44 U/L 16   12       RADIOGRAPHIC STUDIES: No results found.  ASSESSMENT & PLAN Dana Nielsen 77 y.o. female with medical history significant for Dieulafoy lesion bleeding with iron deficiency anemia who presents for a follow up visit.   #Iron Deficiency Anemia 2/2 to GI Bleeding --no evidence of bleeding on today's exam.  --most recent small bowel endoscopy from 09/19/2021 showed dieulafoy lesion with bleeding in the distal duodenum, a few cms distal  to prior tattoo.Treated with argon plasma coagulation --last received IV venofer 200 mg x 2 doses on 10/15/2021 and 10/22/2021.  --unable to tolerate PO iron due to chronic constipation.  --labs today shows white blood cell 6.7, hemoglobin 12.4, MCV 85.6, and platelets of 234. --No need for IV iron at this time.  --RTC in 3 months for labs and 6 months for clinic visits.   # Hypomagnesemia -- Patient has low levels of magnesium, unclear etiology.  May be secondary to diarrhea bouts --Patient takes 400 mg p.o. of magnesium daily.   --magnesium levels today shows 1.3 --recommend to continue magnesium supplement.   #Jehovah's Witness --the patient is not to receive blood products under any circumstances, per her request. We have copied and uploaded her Blood Contract which she carries into her wallet into our  system. --in the event the patient is admitted with severe anemia or bleed please make the Hematology service aware so that we may provide bloodless options for support.    Orders Placed This Encounter  Procedures   Magnesium    Standing Status:   Future    Number of Occurrences:   1    Standing Expiration Date:   02/01/2023   All questions were answered. The patient knows to call the clinic with any problems, questions or concerns.  I have spent a total of 30 minutes minutes of face-to-face and non-face-to-face time, preparing to see the patient, performing a medically appropriate examination, counseling and educating the patient, ordering medications, documenting clinical information in the electronic health record, and care coordination.   Ledell Peoples, MD Department of Hematology/Oncology [place] [facility] Phone: [phone] Pager: [phone] Email: [email or web address]  02/01/2022 4:02 PM

## 2022-02-02 LAB — FERRITIN: Ferritin: 248 ng/mL (ref 11–307)

## 2022-02-10 ENCOUNTER — Other Ambulatory Visit: Payer: Self-pay | Admitting: Family Medicine

## 2022-02-10 DIAGNOSIS — M810 Age-related osteoporosis without current pathological fracture: Secondary | ICD-10-CM

## 2022-02-19 ENCOUNTER — Other Ambulatory Visit: Payer: Medicare HMO

## 2022-02-24 ENCOUNTER — Ambulatory Visit (INDEPENDENT_AMBULATORY_CARE_PROVIDER_SITE_OTHER): Payer: Medicare HMO

## 2022-02-24 DIAGNOSIS — M2042 Other hammer toe(s) (acquired), left foot: Secondary | ICD-10-CM

## 2022-02-24 DIAGNOSIS — E118 Type 2 diabetes mellitus with unspecified complications: Secondary | ICD-10-CM

## 2022-02-24 DIAGNOSIS — M2041 Other hammer toe(s) (acquired), right foot: Secondary | ICD-10-CM

## 2022-02-24 NOTE — Progress Notes (Signed)
Patient presents today to pick up diabetic shoes and insoles.   She tried on the shoes with the insoles and the fit was not satisfactory.   She states the shoes felt too TIGHT    Reorder: [id number] 8.5 W  Patient will be contacted for a fitting appointment once the reordered shoes arrive in office.

## 2022-03-04 NOTE — Telephone Encounter (Signed)
Tried to reach pt for her diabetic shoes no answer no vm  .

## 2022-03-09 NOTE — Telephone Encounter (Signed)
Tried to reach pt for her diabetic shoes no answer no vm  .

## 2022-03-10 ENCOUNTER — Other Ambulatory Visit: Payer: Medicare HMO

## 2022-03-28 ENCOUNTER — Other Ambulatory Visit: Payer: Self-pay

## 2022-03-28 ENCOUNTER — Encounter: Payer: Self-pay | Admitting: Physician Assistant

## 2022-03-28 ENCOUNTER — Encounter: Payer: Self-pay | Admitting: Hematology and Oncology

## 2022-03-28 ENCOUNTER — Emergency Department (HOSPITAL_COMMUNITY)
Admission: EM | Admit: 2022-03-28 | Discharge: 2022-03-28 | Disposition: A | Discharge status: Home / Self Care | Payer: Medicare HMO | Attending: Emergency Medicine | Admitting: Emergency Medicine

## 2022-03-28 DIAGNOSIS — U071 COVID-19: Secondary | ICD-10-CM

## 2022-03-28 DIAGNOSIS — R197 Diarrhea, unspecified: Secondary | ICD-10-CM | POA: Diagnosis not present

## 2022-03-28 DIAGNOSIS — R531 Weakness: Secondary | ICD-10-CM | POA: Diagnosis present

## 2022-03-28 DIAGNOSIS — N390 Urinary tract infection, site not specified: Secondary | ICD-10-CM

## 2022-03-28 LAB — CBC
HCT: 36.5 % (ref 36.0–46.0)
Hemoglobin: 11.3 g/dL — ABNORMAL LOW (ref 12.0–15.0)
MCH: 26.5 pg (ref 26.0–34.0)
MCHC: 31 g/dL (ref 30.0–36.0)
MCV: 85.5 fL (ref 80.0–100.0)
Platelets: 362 10*3/uL (ref 150–400)
RBC: 4.27 MIL/uL (ref 3.87–5.11)
RDW: 14.6 % (ref 11.5–15.5)
WBC: 9 10*3/uL (ref 4.0–10.5)
nRBC: 0 % (ref 0.0–0.2)

## 2022-03-28 LAB — COMPREHENSIVE METABOLIC PANEL
ALT: 14 U/L (ref 0–44)
AST: 17 U/L (ref 15–41)
Albumin: 4.2 g/dL (ref 3.5–5.0)
Alkaline Phosphatase: 60 U/L (ref 38–126)
Anion gap: 11 (ref 5–15)
BUN: 19 mg/dL (ref 8–23)
CO2: 25 mmol/L (ref 22–32)
Calcium: 9.9 mg/dL (ref 8.9–10.3)
Chloride: 101 mmol/L (ref 98–111)
Creatinine, Ser: 0.9 mg/dL (ref 0.44–1.00)
GFR, Estimated: >60 mL/min (ref >60)
Glucose, Bld: 135 mg/dL — ABNORMAL HIGH (ref 70–99)
Potassium: 3.8 mmol/L (ref 3.5–5.1)
Sodium: 137 mmol/L (ref 135–145)
Total Bilirubin: 0.8 mg/dL (ref 0.3–1.2)
Total Protein: 8.2 g/dL — ABNORMAL HIGH (ref 6.5–8.1)

## 2022-03-28 LAB — URINALYSIS, ROUTINE W REFLEX MICROSCOPIC
Bilirubin Urine: NEGATIVE
Glucose, UA: NEGATIVE mg/dL
Hgb urine dipstick: NEGATIVE
Ketones, ur: NEGATIVE mg/dL
Nitrite: POSITIVE — AB
Protein, ur: NEGATIVE mg/dL
Specific Gravity, Urine: 1.01 (ref 1.005–1.030)
pH: 5 (ref 5.0–8.0)

## 2022-03-28 LAB — LIPASE, BLOOD: Lipase: 28 U/L (ref 11–51)

## 2022-03-28 LAB — RESP PANEL BY RT-PCR (RSV, FLU A&B, COVID)  RVPGX2
Influenza A by PCR: NEGATIVE
Influenza B by PCR: NEGATIVE
Resp Syncytial Virus by PCR: NEGATIVE
SARS Coronavirus 2 by RT PCR: POSITIVE — AB

## 2022-03-28 LAB — POC OCCULT BLOOD, ED: Fecal Occult Bld: NEGATIVE

## 2022-03-28 MED ORDER — CEPHALEXIN 500 MG PO CAPS: 500.0000 mg | ORAL_CAPSULE | Freq: Four times a day (QID) | ORAL | 0 refills | Status: DC

## 2022-03-28 MED ORDER — SODIUM CHLORIDE 0.9 % IV SOLN
1.0000 g | Freq: Once | INTRAVENOUS | Status: AC
  Administered 2022-03-28: 1 g via INTRAVENOUS
  Filled 2022-03-28: qty 10

## 2022-03-28 MED ORDER — LACTATED RINGERS IV BOLUS
1000.0000 mL | Freq: Once | INTRAVENOUS | Status: AC
  Administered 2022-03-28: 1000 mL via INTRAVENOUS

## 2022-03-28 NOTE — ED Provider Triage Note (Signed)
Emergency Medicine Provider Triage Evaluation Note  Dana Nielsen , a 77 y.o. female  was evaluated in triage.  Pt complains of diarrhea, nausea, vomiting, headaches, dizziness.  Symptoms have been present for a week.  States every time she eats she has diarrhea afterwards.  No melena or hematochezia.  States the dizziness is mostly constant but worsened with movements.  Describes it as the room spinning.  Denies fevers, chest pain, shortness of breath.  Review of Systems  Positive: As above Negative: As above  Physical Exam  BP (!) 103/97 (BP Location: Right Arm)   Pulse (!) 106   Temp 98.2 F (36.8 C) (Oral)   Resp 20   Ht 5\' 2"  (1.575 m)   Wt 83.5 kg   SpO2 100%   BMI 33.65 kg/m  Gen:   Awake, no distress   Resp:  Normal effort  MSK:   Moves extremities without difficulty  Other:    Medical Decision Making  Medically screening exam initiated at 12:09 PM.  Appropriate orders placed.  Ellani Bilbro was informed that the remainder of the evaluation will be completed by another provider, this initial triage assessment does not replace that evaluation, and the importance of remaining in the ED until their evaluation is complete.  Labs ordered   Roylene Reason, Hershal Coria 03/28/22 1210

## 2022-03-28 NOTE — ED Triage Notes (Signed)
Pt arrived via POV. C/o nausea, diarrhea, dizziness for 1x week.   AOx4

## 2022-03-28 NOTE — ED Provider Notes (Signed)
[place] EMERGENCY DEPARTMENT AT [facility] Provider Note   CSN: [id number] Arrival date & time: 03/28/22  1109     History  Chief Complaint  Patient presents with   Diarrhea   Nausea   Dizziness    Dana Nielsen is a 77 y.o. female.  Pt c/o generally feeling weak, with intermittent nausea, and intermittent diarrhea since her surgery a few months ago. Indicates was on course abx for five days ~ 2 weeks ago for general illness concerns. Diarrhea is a few times per day, not bloody. No abd distension. No abd pain. Has vomited 1-2 times, not bloody or bilious. No dysuria or gu c/o. Denies any pain. No headache. No chest pain. No abd pain. No flank/back pain. Denies recent change in meds other than recent course of antibiotic. Feels dehydrated.   The history is provided by the patient and medical records.  Diarrhea Associated symptoms: no abdominal pain, no chills, no fever and no headaches   Dizziness Associated symptoms: diarrhea and nausea   Associated symptoms: no chest pain, no headaches and no shortness of breath        Home Medications Prior to Admission medications   Medication Sig Start Date End Date Taking? Authorizing Provider  cephALEXin (KEFLEX) 500 MG capsule Take 1 capsule (500 mg total) by mouth 4 (four) times daily. 03/28/22  Yes Lajean Saver, MD  cetirizine (ZYRTEC) 10 MG tablet Take 10 mg by mouth at bedtime.    [provider]  Cyanocobalamin (VITAMIN B-12 PO) Take 1 tablet by mouth in the morning.    [provider]  furosemide (LASIX) 20 MG tablet Take 1 tablet by mouth once daily for 90    [provider]  glucose blood (ACCU-CHEK AVIVA PLUS) test strip Use to test your blood sugar In Vitro twice a day    [provider]  losartan (COZAAR) 25 MG tablet Take 25 mg by mouth in the morning.    [provider]  magnesium oxide (MAG-OX) 400 (240 Mg) MG tablet Take 1 tablet (400 mg total) by mouth  daily. Patient taking differently: Take 400 mg by mouth in the morning. 02/11/21   Orson Slick, MD  metFORMIN (GLUCOPHAGE) 1000 MG tablet Take 1,000 mg by mouth 2 (two) times daily with a meal.    [provider]  Multiple Vitamins-Minerals (HAIR/SKIN/NAILS) CAPS Take 1 capsule by mouth in the morning.    [provider]  Multiple Vitamins-Minerals (ONE-A-DAY WOMENS 50+) TABS Take 1 tablet by mouth in the morning.    [provider]  omeprazole (PRILOSEC) 40 MG capsule Take 40 mg by mouth in the morning. 09/16/21   [provider]  oxyCODONE (OXY IR/ROXICODONE) 5 MG immediate release tablet Take 1 tablet (5 mg total) by mouth every 6 (six) hours as needed for severe pain or breakthrough pain. 12/30/21   [name], DO  simvastatin (ZOCOR) 40 MG tablet Take 1 tablet (40 mg total) by mouth daily at 6 PM. Patient taking differently: Take 40 mg by mouth at bedtime. 04/06/19   Libby Maw, MD  SYNTHROID 100 MCG tablet Take 100 mcg by mouth daily before breakfast.    [provider]  traZODone (DESYREL) 100 MG tablet TAKE 1 TABLET BY MOUTH AT BEDTIME Patient taking differently: Take 100 mg by mouth at bedtime. 09/20/19   Libby Maw, MD  venlafaxine XR (EFFEXOR-XR) 37.5 MG 24 hr capsule Take 37.5 mg by mouth in the morning.  [provider]  VICTOZA 18 MG/3ML SOPN Inject 1.8 mg into the skin in the morning. 09/10/21   [provider]      Allergies    Lidocaine and Macrobid [nitrofurantoin]    Review of Systems   Review of Systems  Constitutional:  Negative for chills and fever.  HENT:  Negative for sore throat.   Eyes:  Negative for visual disturbance.  Respiratory:  Negative for cough and shortness of breath.   Cardiovascular:  Negative for chest pain and leg swelling.  Gastrointestinal:  Positive for diarrhea and nausea. Negative for abdominal pain.  Genitourinary:  Negative for dysuria and flank pain.   Musculoskeletal:  Negative for back pain and neck pain.  Skin:  Negative for rash.  Neurological:  Positive for dizziness and light-headedness. Negative for headaches.  Hematological:  Does not bruise/bleed easily.  Psychiatric/Behavioral:  Negative for confusion.     Physical Exam Updated Vital Signs BP 120/67   Pulse 88   Temp 97.8 F (36.6 C)   Resp 11   Ht 1.575 m (5\' 2" )   Wt 83.5 kg   SpO2 97%   BMI 33.65 kg/m  Physical Exam Vitals and nursing note reviewed.  Constitutional:      Appearance: Normal appearance. She is well-developed.  HENT:     Head: Atraumatic.     Nose: Nose normal.     Mouth/Throat:     Comments: Mildly dry mucous membranes.  Eyes:     General: No scleral icterus.    Conjunctiva/sclera: Conjunctivae normal.     Pupils: Pupils are equal, round, and reactive to light.  Neck:     Trachea: No tracheal deviation.     Comments: No stiffness or rigidity Cardiovascular:     Rate and Rhythm: Normal rate and regular rhythm.     Pulses: Normal pulses.     Heart sounds: Normal heart sounds. No murmur heard.    No friction rub. No gallop.  Pulmonary:     Effort: Pulmonary effort is normal. No respiratory distress.     Breath sounds: Normal breath sounds.  Abdominal:     General: Bowel sounds are normal. There is no distension.     Palpations: Abdomen is soft.     Tenderness: There is no abdominal tenderness.  Genitourinary:    Comments: No cva tenderness. Light brown stool, grossly negative. Musculoskeletal:        General: No swelling or tenderness.     Cervical back: Normal range of motion and neck supple. No rigidity or tenderness. No muscular tenderness.     Right lower leg: No edema.     Left lower leg: No edema.  Skin:    General: Skin is warm and dry.     Findings: No rash.  Neurological:     Mental Status: She is alert.     Comments: Alert, speech normal. Motor/sens grossly intact bil.   Psychiatric:        Mood and Affect: Mood  normal.     ED Results / Procedures / Treatments   Labs (all labs ordered are listed, but only abnormal results are displayed) Results for orders placed or performed during the hospital encounter of 03/28/22  Resp panel by RT-PCR (RSV, Flu A&B, Covid) Anterior Nasal Swab   Specimen: Anterior Nasal Swab  Result Value Ref Range   SARS Coronavirus 2 by RT PCR POSITIVE (A) NEGATIVE   Influenza A by PCR NEGATIVE NEGATIVE   Influenza B by PCR  NEGATIVE NEGATIVE   Resp Syncytial Virus by PCR NEGATIVE NEGATIVE  Lipase, blood  Result Value Ref Range   Lipase 28 11 - 51 U/L  Comprehensive metabolic panel  Result Value Ref Range   Sodium 137 135 - 145 mmol/L   Potassium 3.8 3.5 - 5.1 mmol/L   Chloride 101 98 - 111 mmol/L   CO2 25 22 - 32 mmol/L   Glucose, Bld 135 (H) 70 - 99 mg/dL   BUN 19 8 - 23 mg/dL   Creatinine, Ser 0.90 0.44 - 1.00 mg/dL   Calcium 9.9 8.9 - 10.3 mg/dL   Total Protein 8.2 (H) 6.5 - 8.1 g/dL   Albumin 4.2 3.5 - 5.0 g/dL   AST 17 15 - 41 U/L   ALT 14 0 - 44 U/L   Alkaline Phosphatase 60 38 - 126 U/L   Total Bilirubin 0.8 0.3 - 1.2 mg/dL   GFR, Estimated >60 >60 mL/min   Anion gap 11 5 - 15  CBC  Result Value Ref Range   WBC 9.0 4.0 - 10.5 K/uL   RBC 4.27 3.87 - 5.11 MIL/uL   Hemoglobin 11.3 (L) 12.0 - 15.0 g/dL   HCT 36.5 36.0 - 46.0 %   MCV 85.5 80.0 - 100.0 fL   MCH 26.5 26.0 - 34.0 pg   MCHC 31.0 30.0 - 36.0 g/dL   RDW 14.6 11.5 - 15.5 %   Platelets 362 150 - 400 K/uL   nRBC 0.0 0.0 - 0.2 %  Urinalysis, Routine w reflex microscopic -Urine, Clean Catch  Result Value Ref Range   Color, Urine YELLOW YELLOW   APPearance HAZY (A) CLEAR   Specific Gravity, Urine 1.010 1.005 - 1.030   pH 5.0 5.0 - 8.0   Glucose, UA NEGATIVE NEGATIVE mg/dL   Hgb urine dipstick NEGATIVE NEGATIVE   Bilirubin Urine NEGATIVE NEGATIVE   Ketones, ur NEGATIVE NEGATIVE mg/dL   Protein, ur NEGATIVE NEGATIVE mg/dL   Nitrite POSITIVE (A) NEGATIVE   Leukocytes,Ua MODERATE (A)  NEGATIVE   RBC / HPF 0-5 0 - 5 RBC/hpf   WBC, UA 11-20 0 - 5 WBC/hpf   Bacteria, UA MANY (A) NONE SEEN   Squamous Epithelial / HPF 0-5 0 - 5 /HPF   Mucus PRESENT   POC occult blood, ED Provider will collect  Result Value Ref Range   Fecal Occult Bld NEGATIVE NEGATIVE    EKG EKG Interpretation  Date/Time:  Sunday March 28 2022 15:15:52 EDT Ventricular Rate:  89 PR Interval:  259 QRS Duration: 93 QT Interval:  376 QTC Calculation: 458 R Axis:   -58 Text Interpretation: Sinus rhythm Prolonged PR interval Left anterior fascicular block Confirmed by Lajean Saver [phone]) on 03/28/2022 3:53:15 PM  Radiology No results found.  Procedures Procedures    Medications Ordered in ED Medications  cefTRIAXone (ROCEPHIN) 1 g in sodium chloride 0.9 % 100 mL IVPB (1 g Intravenous New Bag/Given 03/28/22 1702)  lactated ringers bolus 1,000 mL (0 mLs Intravenous Stopped 03/28/22 1534)    ED Course/ Medical Decision Making/ A&P                             Medical Decision Making Problems Addressed: Acute UTI: acute illness or injury COVID-19 virus infection: acute illness or injury with systemic symptoms that poses a threat to life or bodily functions Diarrhea, unspecified type: chronic illness or injury General weakness: acute illness or injury with systemic symptoms that  poses a threat to life or bodily functions  Amount and/or Complexity of Data Reviewed External Data Reviewed: notes. Labs: ordered. Decision-making details documented in ED Course. ECG/medicine tests: ordered.  Risk Prescription drug management. Decision regarding hospitalization.   Iv ns. Continuous pulse ox and cardiac monitoring. Labs ordered/sent.   Differential diagnosis includes dehydration, aki, anemia, uti, etc. Dispo decision including potential need for admission considered - will get labs and reassess.   Reviewed nursing notes and prior charts for additional history. External reports reviewed.   LR  bolus.   Cardiac monitor: sinus rhythm, rate 90.  Labs reviewed/interpreted by me - wbc and hgb normal. Chem normal. Covid is pos. Ua w possible uti. Rocephin iv.   Po fluids/food.   Recheck, denies any pain. No abd pain or tenderness. No nv.  Pt breathing comfortably, o2 sats 97%.   Pt currently appears stable for d/c.  Rec close pcp f/u.  Return precautions provided.             Final Clinical Impression(s) / ED Diagnoses Final diagnoses:  COVID-19 virus infection  Acute UTI  General weakness  Diarrhea, unspecified type    Rx / DC Orders ED Discharge Orders          Ordered    cephALEXin (KEFLEX) 500 MG capsule  4 times daily        03/28/22 1711              Lajean Saver, MD 03/28/22 1713

## 2022-03-28 NOTE — Discharge Instructions (Signed)
It was our pleasure to provide your ER care today - we hope that you feel better.  Drink plenty of fluids/stay well hydrated.   Your covid test is positive and your lab tests show a possible urine infection. Take antibiotic as prescribed.   Follow up closely with primary care doctor in the next 1-2 weeks.  Return to ER if worse, new symptoms, high fevers, chest pain, trouble breathing, severe abdominal pain, persistent vomiting, or other emergency concern.

## 2022-03-29 ENCOUNTER — Telehealth: Payer: Self-pay

## 2022-03-29 NOTE — Telephone Encounter (Signed)
T/C from pt wanting to let Dr Lorenso Courier know that she went to the ED yesterday 3/17 and was diagnosed with covid.  Her Hgb was 11.3 and no blood in her stool.  She is asking if you want to intervene or have any recommendations.  Please advise

## 2022-04-05 NOTE — Telephone Encounter (Signed)
Pt advised with Vu and message sent to scheduling to make lab only appt the first week of April

## 2022-04-06 ENCOUNTER — Telehealth: Payer: Self-pay | Admitting: Hematology and Oncology

## 2022-04-06 NOTE — Telephone Encounter (Signed)
Per 3/25 IB reached out to patient to schedule; left voicemail.

## 2022-04-06 NOTE — Telephone Encounter (Signed)
Lmom to get pt scheduled for her shoes , no answer

## 2022-04-14 ENCOUNTER — Other Ambulatory Visit: Payer: Self-pay

## 2022-04-14 ENCOUNTER — Inpatient Hospital Stay: Payer: Medicare HMO | Attending: Hematology and Oncology

## 2022-04-14 ENCOUNTER — Other Ambulatory Visit: Payer: Self-pay | Admitting: Hematology and Oncology

## 2022-04-14 ENCOUNTER — Other Ambulatory Visit: Payer: Self-pay | Admitting: *Deleted

## 2022-04-14 DIAGNOSIS — D500 Iron deficiency anemia secondary to blood loss (chronic): Secondary | ICD-10-CM

## 2022-04-14 LAB — CBC WITH DIFFERENTIAL (CANCER CENTER ONLY)
Abs Immature Granulocytes: 0.05 10*3/uL (ref 0.00–0.07)
Basophils Absolute: 0.1 10*3/uL (ref 0.0–0.1)
Basophils Relative: 1 %
Eosinophils Absolute: 0.3 10*3/uL (ref 0.0–0.5)
Eosinophils Relative: 4 %
HCT: 35.9 % — ABNORMAL LOW (ref 36.0–46.0)
Hemoglobin: 11.1 g/dL — ABNORMAL LOW (ref 12.0–15.0)
Immature Granulocytes: 1 %
Lymphocytes Relative: 34 %
Lymphs Abs: 2.4 10*3/uL (ref 0.7–4.0)
MCH: 26.7 pg (ref 26.0–34.0)
MCHC: 30.9 g/dL (ref 30.0–36.0)
MCV: 86.5 fL (ref 80.0–100.0)
Monocytes Absolute: 0.6 10*3/uL (ref 0.1–1.0)
Monocytes Relative: 8 %
Neutro Abs: 3.6 10*3/uL (ref 1.7–7.7)
Neutrophils Relative %: 52 %
Platelet Count: 373 10*3/uL (ref 150–400)
RBC: 4.15 MIL/uL (ref 3.87–5.11)
RDW: 14.6 % (ref 11.5–15.5)
WBC Count: 6.9 10*3/uL (ref 4.0–10.5)
nRBC: 0 % (ref 0.0–0.2)

## 2022-04-14 LAB — RETIC PANEL
Immature Retic Fract: 16.4 % — ABNORMAL HIGH (ref 2.3–15.9)
RBC.: 4.11 MIL/uL (ref 3.87–5.11)
Retic Count, Absolute: 71.1 10*3/uL (ref 19.0–186.0)
Retic Ct Pct: 1.7 % (ref 0.4–3.1)
Reticulocyte Hemoglobin: 30.8 pg (ref >27.9)

## 2022-04-14 LAB — CMP (CANCER CENTER ONLY)
ALT: 15 U/L (ref 0–44)
AST: 14 U/L — ABNORMAL LOW (ref 15–41)
Albumin: 4.1 g/dL (ref 3.5–5.0)
Alkaline Phosphatase: 66 U/L (ref 38–126)
Anion gap: 8 (ref 5–15)
BUN: 20 mg/dL (ref 8–23)
CO2: 28 mmol/L (ref 22–32)
Calcium: 10.2 mg/dL (ref 8.9–10.3)
Chloride: 105 mmol/L (ref 98–111)
Creatinine: 0.97 mg/dL (ref 0.44–1.00)
GFR, Estimated: >60 mL/min (ref >60)
Glucose, Bld: 118 mg/dL — ABNORMAL HIGH (ref 70–99)
Potassium: 4.4 mmol/L (ref 3.5–5.1)
Sodium: 141 mmol/L (ref 135–145)
Total Bilirubin: 0.3 mg/dL (ref 0.3–1.2)
Total Protein: 7.7 g/dL (ref 6.5–8.1)

## 2022-04-14 LAB — IRON AND IRON BINDING CAPACITY (CC-WL,HP ONLY)
Iron: 65 ug/dL (ref 28–170)
Saturation Ratios: 17 % (ref 10.4–31.8)
TIBC: 374 ug/dL (ref 250–450)
UIBC: 309 ug/dL (ref 148–442)

## 2022-04-14 LAB — MAGNESIUM: Magnesium: 1.5 mg/dL — ABNORMAL LOW (ref 1.7–2.4)

## 2022-04-14 LAB — FERRITIN: Ferritin: 351 ng/mL — ABNORMAL HIGH (ref 11–307)

## 2022-04-15 ENCOUNTER — Telehealth: Payer: Self-pay | Admitting: *Deleted

## 2022-04-15 NOTE — Telephone Encounter (Signed)
-----   Message from Orson Slick, MD sent at 04/15/2022  8:58 AM EDT ----- Please let Dana Nielsen know that her hemoglobin is at 11.1 but her iron levels remain excellent.  Her magnesium is modestly improved to 1.5 (target is 2.0).  We will plan to have her back for labs on 05/03/2022.  In the interim if she were to have any changes in her symptoms we will be happy to see her back or check her labs sooner.  ----- Message ----- From: Buel Ream, Lab In [place] Sent: 04/14/2022   1:03 PM EDT To: Orson Slick, MD

## 2022-04-15 NOTE — Telephone Encounter (Signed)
LM to call Dr Dorsey's nurse 

## 2022-05-01 ENCOUNTER — Other Ambulatory Visit: Payer: Self-pay | Admitting: Hematology and Oncology

## 2022-05-01 DIAGNOSIS — D500 Iron deficiency anemia secondary to blood loss (chronic): Secondary | ICD-10-CM

## 2022-05-03 ENCOUNTER — Inpatient Hospital Stay: Payer: Medicare HMO

## 2022-05-03 ENCOUNTER — Other Ambulatory Visit: Payer: Self-pay

## 2022-05-03 DIAGNOSIS — D500 Iron deficiency anemia secondary to blood loss (chronic): Secondary | ICD-10-CM | POA: Diagnosis not present

## 2022-05-03 LAB — CMP (CANCER CENTER ONLY)
ALT: 12 U/L (ref 0–44)
AST: 13 U/L — ABNORMAL LOW (ref 15–41)
Albumin: 4.3 g/dL (ref 3.5–5.0)
Alkaline Phosphatase: 53 U/L (ref 38–126)
Anion gap: 7 (ref 5–15)
BUN: 14 mg/dL (ref 8–23)
CO2: 29 mmol/L (ref 22–32)
Calcium: 9.8 mg/dL (ref 8.9–10.3)
Chloride: 107 mmol/L (ref 98–111)
Creatinine: 0.86 mg/dL (ref 0.44–1.00)
GFR, Estimated: >60 mL/min (ref >60)
Glucose, Bld: 99 mg/dL (ref 70–99)
Potassium: 4.3 mmol/L (ref 3.5–5.1)
Sodium: 143 mmol/L (ref 135–145)
Total Bilirubin: 0.3 mg/dL (ref 0.3–1.2)
Total Protein: 7.5 g/dL (ref 6.5–8.1)

## 2022-05-03 LAB — CBC WITH DIFFERENTIAL (CANCER CENTER ONLY)
Abs Immature Granulocytes: 0.01 10*3/uL (ref 0.00–0.07)
Basophils Absolute: 0.1 10*3/uL (ref 0.0–0.1)
Basophils Relative: 1 %
Eosinophils Absolute: 0.2 10*3/uL (ref 0.0–0.5)
Eosinophils Relative: 4 %
HCT: 35.7 % — ABNORMAL LOW (ref 36.0–46.0)
Hemoglobin: 10.9 g/dL — ABNORMAL LOW (ref 12.0–15.0)
Immature Granulocytes: 0 %
Lymphocytes Relative: 27 %
Lymphs Abs: 1.6 10*3/uL (ref 0.7–4.0)
MCH: 26.8 pg (ref 26.0–34.0)
MCHC: 30.5 g/dL (ref 30.0–36.0)
MCV: 87.7 fL (ref 80.0–100.0)
Monocytes Absolute: 0.5 10*3/uL (ref 0.1–1.0)
Monocytes Relative: 8 %
Neutro Abs: 3.6 10*3/uL (ref 1.7–7.7)
Neutrophils Relative %: 60 %
Platelet Count: 245 10*3/uL (ref 150–400)
RBC: 4.07 MIL/uL (ref 3.87–5.11)
RDW: 15.8 % — ABNORMAL HIGH (ref 11.5–15.5)
WBC Count: 6 10*3/uL (ref 4.0–10.5)
nRBC: 0 % (ref 0.0–0.2)

## 2022-05-03 LAB — IRON AND IRON BINDING CAPACITY (CC-WL,HP ONLY)
Iron: 68 ug/dL (ref 28–170)
Saturation Ratios: 18 % (ref 10.4–31.8)
TIBC: 372 ug/dL (ref 250–450)
UIBC: 304 ug/dL (ref 148–442)

## 2022-05-03 LAB — RETIC PANEL
Immature Retic Fract: 15.8 % (ref 2.3–15.9)
RBC.: 4.02 MIL/uL (ref 3.87–5.11)
Retic Count, Absolute: 68.3 10*3/uL (ref 19.0–186.0)
Retic Ct Pct: 1.7 % (ref 0.4–3.1)
Reticulocyte Hemoglobin: 30.3 pg (ref >27.9)

## 2022-05-03 LAB — FERRITIN: Ferritin: 228 ng/mL (ref 11–307)

## 2022-05-04 ENCOUNTER — Telehealth: Payer: Self-pay

## 2022-05-04 NOTE — Telephone Encounter (Signed)
Dana Nielsen called wanting to know what was the plan , because she was not happy with her labs, I informed Dr. Leonides Schanz . He had me to let her know that her Iron level was good and her hg was down a little at 10.9. I also let the pt know per Dr.Dorsey for her to reach out to her GI provider. Pt verbalized understanding and said she call. I let the pt know if she has any questions call the office.  Dana Nielsen M. CMA

## 2022-05-05 ENCOUNTER — Telehealth: Payer: Self-pay | Admitting: *Deleted

## 2022-05-05 NOTE — Telephone Encounter (Signed)
TCT patient regarding recent lab results. Spoke with her and advised that she does not require any IV iron at this time. Ferritin was 228, HGB is 10.9 Pt voiced understanding. She states she saw her new PCP and he has also reached out to her GI doctor and started her back on omeprazole.

## 2022-05-07 ENCOUNTER — Other Ambulatory Visit: Payer: Self-pay

## 2022-05-07 ENCOUNTER — Telehealth: Payer: Self-pay

## 2022-05-07 DIAGNOSIS — Z8719 Personal history of other diseases of the digestive system: Secondary | ICD-10-CM

## 2022-05-07 DIAGNOSIS — D649 Anemia, unspecified: Secondary | ICD-10-CM

## 2022-05-07 NOTE — Telephone Encounter (Signed)
Appointment is scheduled with patient for Video Capsule Endoscopy on 06/02/22 at 8:30 AM. Patient is already scheduled for OV with Dana Meeker, PA on 07/06/22. Instruction for VCE is mailed to patient's address, sent via MyChart, and went over by phone as well. Patient voiced understanding.

## 2022-05-07 NOTE — Telephone Encounter (Signed)
-----   Message from [facility] V, DO sent at 05/04/2022  4:45 PM EDT ----- Please see message below and plan for the following: - Schedule for VCE (anemia, history of duodenal bleed) - Schedule OV with me or one of the APPs  Thanks!  ----- Message ----- From: Jaci Standard, MD Sent: 05/04/2022   3:55 PM EDT To: Shellia Cleverly, DO  Hey Gae Bon,  I wanted to reach out to you about Dana Nielsen. We checked her labs and found her Hgb continues drifting down, currently Hgb 10.9. Iron levels look good. She does get quite worried because she has had more severe anemia in the past and she is a [employer], so we don't have blood transfusion as a back up. I wanted to see if you could reach out to her to discuss options for monitoring for stool blood loss. We can order labs as frequently as needed to continue monitoring. We have been checking every 2 weeks since she dropped below 12.   Best,  Vonna Kotyk

## 2022-05-12 HISTORY — PX: CATARACT EXTRACTION: SUR2

## 2022-06-02 ENCOUNTER — Ambulatory Visit (INDEPENDENT_AMBULATORY_CARE_PROVIDER_SITE_OTHER): Payer: Medicare HMO | Admitting: Gastroenterology

## 2022-06-02 DIAGNOSIS — K3182 Dieulafoy lesion (hemorrhagic) of stomach and duodenum: Secondary | ICD-10-CM | POA: Diagnosis not present

## 2022-06-02 DIAGNOSIS — D509 Iron deficiency anemia, unspecified: Secondary | ICD-10-CM | POA: Diagnosis not present

## 2022-06-02 NOTE — Progress Notes (Signed)
Capsule ID: [id number] Exp: 2023-10-03 LOT: [id number]  Patient arrived for Capsule Endoscopy. Reported the prep went well. This nurse explained dietary restrictions for the next few hours. Patient verbalized understanding. Opened capsule, ensured capsule was flashing prior to the patient swallowing the capsule. Patient swallowed capsule without difficulty. Patient instructed to return to the office at 4:00 pm today for removal of the recording equipment, to call the office with any questions and if no capsule was visualized after 72 hours. No further questions by the conclusion of the visit.

## 2022-06-02 NOTE — Patient Instructions (Signed)
You may have a clear liquid diet at 10:30 am ? ?You may have a light lunch beginning at 12:30 ( ex 1/2 sandwich and a bowl of soup) ? ?You may resume your normal diet at 5:00 pm ? ?If you experience any abdominal pain, nausea ,or vomiting after ingesting the capsule please call [phone] to notify the nurse ? ?You may not have an MRI until you have confirmation that you have passed the capsule.  ? ?Please return to the office at 4 PM today to return the equipment. ?  ?

## 2022-06-08 ENCOUNTER — Telehealth: Payer: Self-pay | Admitting: Gastroenterology

## 2022-06-08 DIAGNOSIS — T184XXA Foreign body in colon, initial encounter: Secondary | ICD-10-CM

## 2022-06-08 NOTE — Telephone Encounter (Signed)
Xray order in epic

## 2022-06-08 NOTE — Telephone Encounter (Signed)
Inbound call from patient states she has not retrieved the camera capsule .Patient has been advised by nurse to go to xray tomorrow.patient advised understanding.

## 2022-06-09 ENCOUNTER — Ambulatory Visit (INDEPENDENT_AMBULATORY_CARE_PROVIDER_SITE_OTHER)
Admission: RE | Admit: 2022-06-09 | Discharge: 2022-06-09 | Disposition: A | Discharge status: Home / Self Care | Payer: Medicare HMO | Source: Ambulatory Visit | Attending: Gastroenterology | Admitting: Gastroenterology

## 2022-06-09 DIAGNOSIS — T184XXA Foreign body in colon, initial encounter: Secondary | ICD-10-CM

## 2022-06-10 ENCOUNTER — Telehealth: Payer: Self-pay

## 2022-06-10 NOTE — Telephone Encounter (Signed)
Called patient and LVM to return the call in regards to her VCE result.

## 2022-06-10 NOTE — Telephone Encounter (Signed)
-----   Message from [facility] V, DO sent at 06/10/2022  4:20 PM EDT ----- Please contact this patient with the following:  Images from Video Capsule Endoscopy reviewed and notable for the following:  Findings: 1) Complete capsule study with adequate prep 2) Tattoo noted in duodenum, approximately 2 minutes beyond pylorus 3) Possible faint red area near the tattoo site.  Possibly a small, nonbleeding AVM or local inflammation from previous endoscopic intervention 4) the remainder of the small bowel was normal-appearing.  No active bleeding or stigmata of bleeding on the remainder of the study 5) first duodenal image at 12 minutes 50 seconds.  First cecal image at 6 hours 45 minutes  Summary Possible small, nonbleeding lesion in the proximal small bowel located near the previously placed tattoo.  Unclear if this is a nonbleeding AVM or possibly just some local erythema from previous instrumentation/intervention.  Either way, no active bleeding to require repeat endoscopy at this juncture.  Otherwise, has had good response to IV iron with stable/uptrending Hgb and normal iron indices on most recent lab work.  Recommendations: - Continue follow-up in the Hematology clinic with serial lab work and additional IV iron as needed - If recurrence of melena or declining hemoglobin, plan for push enteroscopy and likely colonoscopy for diagnostic and therapeutic intent - Can follow-up in the GI clinic as needed   Thanks!

## 2022-06-10 NOTE — Progress Notes (Signed)
Images from Video Capsule Endoscopy reviewed and notable for the following:  Findings: 1) Complete capsule study with adequate prep 2) Tattoo noted in duodenum, approximately 2 minutes beyond pylorus 3) Possible faint red area near the tattoo site.  Possibly a small, nonbleeding AVM or local inflammation from previous endoscopic intervention 4) the remainder of the small bowel was normal-appearing.  No active bleeding or stigmata of bleeding on the remainder of the study 5) first duodenal image at 12 minutes 50 seconds.  First cecal image at 6 hours 45 minutes  Summary Possible small, nonbleeding lesion in the proximal small bowel located near the previously placed tattoo.  Unclear if this is a nonbleeding AVM or possibly just some local erythema from previous instrumentation/intervention.  Either way, no active bleeding to require repeat endoscopy at this juncture.  Otherwise, has had good response to IV iron with stable/uptrending Hgb and normal iron indices on most recent lab work.  Recommendations: - Continue follow-up in the Hematology clinic with serial lab work and additional IV iron as needed - If recurrence of melena or declining hemoglobin, plan for push enteroscopy and likely colonoscopy for diagnostic and therapeutic intent - Can follow-up in the GI clinic as needed  (Full VCE report and images will be scanned into EMR)   Doristine Locks, DO, [facility]

## 2022-06-11 NOTE — Telephone Encounter (Signed)
Inbound call from patient returning phone call regarding VCE results. Please advise, thank you.

## 2022-06-11 NOTE — Telephone Encounter (Signed)
Called patient and LVM to return the call in regards to her VCE result. 

## 2022-06-11 NOTE — Telephone Encounter (Signed)
Patient is notified VCE result and recommendations outlined by Dr. Barron Alvine. Patient verbalized understanding.

## 2022-06-14 NOTE — Telephone Encounter (Signed)
Patient called would like her results explained to her again said she was not able to understand initial call.

## 2022-06-14 NOTE — Telephone Encounter (Signed)
Spoke with patient. Stated she is concerned with non bleeding lesion in her small bowel. Advised her per Dr. Barron Alvine it is not clear if it is from previous endoscopic intervention. Patient was requesting to see provider sooner to discuss medication or dietary modification she can incorporate to prevent  future GI bleed. Patient is scheduled to see Hyacinth Meeker, PA on 6/25. Advised her I would be calling her tomorrow morning if I find sooner appointment. Patient voiced understanding.

## 2022-06-14 NOTE — Telephone Encounter (Signed)
Called patient and LVM to return the call regarding her result.

## 2022-06-15 NOTE — Telephone Encounter (Signed)
Rescheduled appointment on 06/22/22 at 8:30 AM with [facility].

## 2022-06-17 ENCOUNTER — Encounter: Payer: Self-pay | Admitting: Gastroenterology

## 2022-06-22 ENCOUNTER — Ambulatory Visit: Payer: Medicare HMO | Admitting: Nurse Practitioner

## 2022-06-22 ENCOUNTER — Encounter: Payer: Self-pay | Admitting: Nurse Practitioner

## 2022-06-22 VITALS — BP 122/64 | HR 88 | Ht 62.0 in | Wt 190.0 lb

## 2022-06-22 DIAGNOSIS — D509 Iron deficiency anemia, unspecified: Secondary | ICD-10-CM

## 2022-06-22 NOTE — Patient Instructions (Addendum)
Follow up with Dr. Everlene Other regarding persistent fatigue and repeat CBC and iron panel in 4 to 6 weeks  Follow up with Dr. Leonides Schanz to discuss further IV iron infusions to keep hemoglobin level > 12  Avoid acidic and spicy foods   Reduce breads/pasta/rice/potatoes/sweets to lose weight as tolerated   Follow up in office in 3 months to further discuss concerns regarding fatty liver disease   Contact our office if you develop any black stools or red blood per the rectum   Go to the emergency room if you pass frequent loose black stools.  Thank you for trusting me with your gastrointestinal care!   Alcide Evener, CRNP

## 2022-06-22 NOTE — Progress Notes (Signed)
06/22/2022 Dana Nielsen [id number] 08-May-1945   Chief Complaint: Follow up anemia, discuss capsule endoscopy results  History of Present Illness: Dana Nielsen is a 77 year old female with a past medical history of diabetes mellitus type II, hyperlipidemia, hypothyroidism, 5cm hiatal hernia, gastric ulcers, UGI bleed secondary to a dieulafoy lesion and IDA. She is a Scientist, product/process development. She is well known by Dr. Barron Alvine.   She is followed by hematologist Dr. Jeanie Sewer regarding iron deficiency anemia requiring IV iron as needed.  Her hemoglobin level dropped to 10.9 with normal iron levels and Dr. Leonides Schanz contacted Dr. Barron Alvine and it was decided to schedule her for small bowel capsule endoscopy.  She underwent a small bowel capsule endoscopy 06/02/2022 which showed a faint red area near the tattoo site (from the prior dieulafoy lesion endoscopic intervention/clip placement) possibly representing a small nonbleeding AVM or local inflammation.  No active bleeding or stigmata of bleeding was noted therefore her EGD was not recommended at that time. However, Dr. Barron Alvine will consider a push enteroscopy +/- colonoscopy if she has recurrent melena or a significant decline in her hemoglobin level.  She was also advised to continue follow-up in the hematology clinic for serial lab work and IV iron as needed.  Last iron infusion was 10/2021.  She questions if she needs to maintain a certain diet to reduce the risk of future GI bleeding.  No heartburn or dysphagia.  No abdominal pain.  She denies having any recent melena.  No bright rectal bleeding.  She passes a brown bowel movement every 4 to 5 days following day she passes numerous formed to loose stools.  She takes milk of magnesia as needed.  She complains of increased fatigue for the past 2 weeks.  No chest pain or shortness of breath.  One month ago, she was mowing her entire yard and cutting down trees.  He endorsed undergoing [place]  [place], results are unclear but she believes a few polyps were removed. Paunt with history of colon cancer.  Labs 06/14/2022: WBC 5.7.  Hemoglobin 11.5.  Hematocrit 34.1.  Platelet 192.  Iron 57.  Iron saturation 17%.  TIBC 309.  Labs 05/19/2022: WBC 7.3.  Hemoglobin 11.5.  Hematocrit 33.8.  Platelet 230.  Labs 05/03/2022: Iron 68.  Saturation ratio is 18.  Ferritin 228.  TIBC 372.     Latest Ref Rng & Units 05/03/2022    1:12 PM 04/14/2022   12:38 PM 03/28/2022   11:30 AM  CBC  WBC 4.0 - 10.5 K/uL 6.0  6.9  9.0   Hemoglobin 12.0 - 15.0 g/dL 78.2  95.6  21.3   Hematocrit 36.0 - 46.0 % 35.7  35.9  36.5   Platelets 150 - 400 K/uL 245  373  362        Latest Ref Rng & Units 05/03/2022    1:12 PM 04/14/2022   12:38 PM 03/28/2022   11:30 AM  CMP  Glucose 70 - 99 mg/dL 99  086  578   BUN 8 - 23 mg/dL 14  20  19    Creatinine 0.44 - 1.00 mg/dL 4.69  6.29  5.28   Sodium 135 - 145 mmol/L 143  141  137   Potassium 3.5 - 5.1 mmol/L 4.3  4.4  3.8   Chloride 98 - 111 mmol/L 107  105  101   CO2 22 - 32 mmol/L 29  28  25    Calcium  8.9 - 10.3 mg/dL 9.8  16.1  9.9   Total Protein 6.5 - 8.1 g/dL 7.5  7.7  8.2   Total Bilirubin 0.3 - 1.2 mg/dL 0.3  0.3  0.8   Alkaline Phos 38 - 126 U/L 53  66  60   AST 15 - 41 U/L 13  14  17    ALT 0 - 44 U/L 12  15  14      RUQ sonogram 03/08/2019: 9 mm gallbladder polyp.  No gallstones   Echogenic liver suggesting fatty infiltration. Liver does not appear subjectively be enlarged. Spleen not enlarged  PAST GI PROCEDURES:  Small bowel capsule endoscopy 06/02/2022:  1) Complete capsule study with adequate prep 2) Tattoo noted in duodenum, approximately 2 minutes beyond pylorus 3) Possible faint red area near the tattoo site.  Possibly a small, nonbleeding AVM or local inflammation from previous endoscopic intervention 4) the remainder of the small bowel was normal-appearing.  No active bleeding or stigmata of bleeding  on the remainder of the study 5) first duodenal image at 12 minutes 50 seconds.  First cecal image at 6 hours 45 minutes   Summary Possible small, nonbleeding lesion in the proximal small bowel located near the previously placed tattoo.  Unclear if this is a nonbleeding AVM or possibly just some local erythema from previous instrumentation/intervention.  Either way, no active bleeding to require repeat endoscopy at this juncture.    Small bowel enteroscopy 09/19/2021: - Esophagogastric landmarks identified.  - 5 cm hiatal hernia.  - Normal esophagus - A few gastric polyps.  - Normal stomach otherwise  - Blood in the third portion of the duodenum and in the fourth portion of the duodenum.  - A tattoo was seen in the 3rd portion of the duodenum. The tattoo site appeared normal. - A dieulafoy lesion with bleeding in the distal duodenum, a few cms distal to prior tattoo. Treated with argon plasma coagulation (APC). Clips were placed. - The examined portion of the jejunum was normal. Distal aspect reached was tattooed. Bleeding dieulafoy lesion is the cause of patient's recent bleeding, treated endoscopically with good result  EGD 05/15/2021: - Normal esophagus.  - Z-line regular, 36 cm from the incisors.  - Multiple gastric polyps. Resected and retrieved.  - Normal mucosa was found in the entire stomach.  - Normal examined duodenum.  Small bowel enteroscopy 03/08/2019: - 2 cm hiatal hernia.  - Normal upper third of esophagus, middle third of esophagus and lower third of esophagus.  - Non-bleeding gastric ulcer with no stigmata of bleeding. Clips (MR conditional) were placed.  - Gastritis. Biopsied.  - A medium amount of food (residue) in the stomach.  - Active bleeding lesion in the third portion of the duodenum. Appearance most consistent with duodenal Dieulafoy, which was successfully treated with hemostatic clip x1. The area 1-2 cm distal to the lesion was then tattooed for future  identification in the event of rebleed and given elusive behavior of Dieulafoy lesions.  - Otherwise normal remainder of the small bowel including normal appearing papilla well proximal to the bleeding lesion  Past Medical History:  Diagnosis Date   Endometrial mass    Family history of colon cancer 12/09/2020   Family history of lung cancer 12/09/2020   Family history of multiple myeloma 12/09/2020   Full dentures    GERD (gastroesophageal reflux disease)    H/O: upper GI bleed    las time 09-19-2021  s/p EGD with bleeding Dielufoy lesion of distal  duodedum tx'd APC and clips;  previous gi bleed 03-08-2019  s/p EGD w/ hemastatis and clips   History of gastric ulcer    w/ upper GI bleed 03-08-2019 gastric ulcer nonbleeding and duodunal bleeding lesion   HLD (hyperlipidemia)    Hypothyroidism    followed by pcp   Iron deficiency anemia    hematologist--- dr Shela Commons. Leonides Schanz;   due to hx GI bleed and blood product refusal   MDD (major depressive disorder)    OA (osteoarthritis)    hands, knees   Ovarian cyst, right    Refusal of blood transfusions as patient is Jehovah's Witness    Type 2 diabetes mellitus (HCC)    followed by pcp  (12-29-2021  per pt check blood sugar daily in am fasting, average 105--110)   Wears hearing aid in both ears    Past Surgical History:  Procedure Laterality Date   BIOPSY  03/08/2019   Procedure: BIOPSY;  Surgeon: Shellia Cleverly, DO;  Location: [facility];  Service: Gastroenterology;;   CATARACT EXTRACTION Left 05/2022   [place]  2019   [place] and in [place]   DILATATION & CURETTAGE/HYSTEROSCOPY WITH MYOSURE N/A 12/30/2021   Procedure: DILATATION & CURETTAGE/HYSTEROSCOPY WITH MYOSURE;  Surgeon: Steva Ready, DO;  Location: [facility];  Service: Gynecology;  Laterality: N/A;   DILATION AND CURETTAGE OF UTERUS     yrs ago   ENTEROSCOPY N/A 03/08/2019   Procedure: ENTEROSCOPY;  Surgeon: Shellia Cleverly, DO;   Location: [facility];  Service: Gastroenterology;  Laterality: N/A;   ENTEROSCOPY N/A 09/19/2021   Procedure: ENTEROSCOPY;  Surgeon: Benancio Deeds, MD;  Location: [facility];  Service: Gastroenterology;  Laterality: N/A;   HEMOSTASIS CLIP PLACEMENT  03/08/2019   Procedure: HEMOSTASIS CLIP PLACEMENT;  Surgeon: Shellia Cleverly, DO;  Location: [facility];  Service: Gastroenterology;;   HEMOSTASIS CLIP PLACEMENT  09/19/2021   Procedure: HEMOSTASIS CLIP PLACEMENT;  Surgeon: Benancio Deeds, MD;  Location: [facility];  Service: Gastroenterology;;   HOT HEMOSTASIS N/A 09/19/2021   Procedure: HOT HEMOSTASIS (ARGON PLASMA COAGULATION/BICAP);  Surgeon: Benancio Deeds, MD;  Location: [facility];  Service: Gastroenterology;  Laterality: N/A;   KNEE ARTHROSCOPY Left 2003   LAPAROSCOPIC BILATERAL SALPINGO OOPHERECTOMY Bilateral 12/30/2021   Procedure: LAPAROSCOPIC BILATERAL SALPINGO OOPHORECTOMY;  Surgeon: Steva Ready, DO;  Location: [facility];  Service: Gynecology;  Laterality: Bilateral;   SUBMUCOSAL TATTOO INJECTION  03/08/2019   Procedure: SUBMUCOSAL TATTOO INJECTION;  Surgeon: Shellia Cleverly, DO;  Location: [facility];  Service: Gastroenterology;;   SUBMUCOSAL TATTOO INJECTION  09/19/2021   Procedure: SUBMUCOSAL TATTOO INJECTION;  Surgeon: Benancio Deeds, MD;  Location: [facility];  Service: Gastroenterology;;   TONSILLECTOMY     child   TUBAL LIGATION Bilateral 1990   WISDOM TOOTH EXTRACTION     WRIST FRACTURE SURGERY Right 2013   per pt for crush injury,  no hardware    Current Outpatient Medications on File Prior to Visit  Medication Sig Dispense Refill   AMBULATORY NON FORMULARY MEDICATION Take 2 capsules by mouth daily. Medication Name: FLORADIX (iron supplement)     cetirizine (ZYRTEC) 10 MG tablet Take 10 mg by mouth at bedtime.     Cyanocobalamin (VITAMIN B-12 PO) Take 1 tablet by mouth in the morning.     furosemide  (LASIX) 20 MG tablet Take 20 mg by mouth daily as needed.     glucose blood (ACCU-CHEK AVIVA PLUS) test strip Use  to test your blood sugar In Vitro twice a day     losartan (COZAAR) 25 MG tablet Take 25 mg by mouth in the morning.     magnesium oxide (MAG-OX) 400 (240 Mg) MG tablet Take 1 tablet (400 mg total) by mouth daily. (Patient taking differently: Take 400 mg by mouth in the morning.) 30 tablet 1   metFORMIN (GLUCOPHAGE) 1000 MG tablet Take 1,000 mg by mouth 2 (two) times daily with a meal.     Multiple Vitamins-Minerals (HAIR/SKIN/NAILS) CAPS Take 1 capsule by mouth in the morning.     Multiple Vitamins-Minerals (ONE-A-DAY WOMENS 50+) TABS Take 1 tablet by mouth in the morning.     omeprazole (PRILOSEC) 40 MG capsule Take 40 mg by mouth in the morning.     simvastatin (ZOCOR) 40 MG tablet Take 1 tablet (40 mg total) by mouth daily at 6 PM. (Patient taking differently: Take 40 mg by mouth at bedtime.) 90 tablet 3   SYNTHROID 100 MCG tablet Take 100 mcg by mouth daily before breakfast.     traZODone (DESYREL) 100 MG tablet TAKE 1 TABLET BY MOUTH AT BEDTIME (Patient taking differently: Take 100 mg by mouth at bedtime.) 90 tablet 0   VICTOZA 18 MG/3ML SOPN Inject 1.8 mg into the skin in the morning.     oxyCODONE (OXY IR/ROXICODONE) 5 MG immediate release tablet Take 1 tablet (5 mg total) by mouth every 6 (six) hours as needed for severe pain or breakthrough pain. (Patient not taking: Reported on 06/22/2022) 12 tablet 0   venlafaxine XR (EFFEXOR-XR) 37.5 MG 24 hr capsule Take 37.5 mg by mouth in the morning. (Patient not taking: Reported on 06/22/2022)     No current facility-administered medications on file prior to visit.   Allergies  Allergen Reactions   Lidocaine Shortness Of Breath    Per pt had wrist injection w/ lidocaine,  stated sob w/ throat closing   Macrobid [Nitrofurantoin] Other (See Comments)    Headache Confusion  Dizziness Myalgias    Current Medications, Allergies, Past  Medical History, Past Surgical History, Family History and Social History were reviewed in [employer] record.  Review of Systems:   Constitutional: Negative for fever, sweats, chills or weight loss.  Respiratory: Negative for shortness of breath.   Cardiovascular: Negative for chest pain, palpitations and leg swelling.  Gastrointestinal: See HPI.  Musculoskeletal: Negative for back pain or muscle aches.  Neurological: Negative for dizziness, headaches or paresthesias.   Physical Exam: BP 122/64   Pulse 88   Ht 5\' 2"  (1.575 m)   Wt 190 lb (86.2 kg)   SpO2 95%   BMI 34.75 kg/m   General: 77 year old female in no acute distress. Head: Normocephalic and atraumatic. Eyes: No scleral icterus. Conjunctiva pink . Ears: Normal auditory acuity. Mouth: Dentition intact. No ulcers or lesions.  Lungs: Clear throughout to auscultation. Heart: Regular rate and rhythm. Soft systolic murmur.  Abdomen: Soft, nontender and nondistended. No masses or hepatomegaly. Normal bowel sounds x 4 quadrants.  Rectal: Deferred.  Musculoskeletal: Symmetrical with no gross deformities. Extremities: No edema. Neurological: Alert oriented x 4. No focal deficits.  Psychological: Alert and cooperative. Normal mood and affect  Assessment and Recommendations:  77 year old female with a history of UGI bleed/melena 02/2019 secondary to a dieulafoy lesion s/p clip placement 2021 with chronic GI blood loss and IDA. Small bowel capsule endoscopy 5/22/204 showed possible small, nonbleeding lesion in the proximal small bowel located near the previously  placed tattoo.  Unclear if this is a nonbleeding AVM or possibly just some local erythema from previous instrumentation/intervention. Hg 11.5.  Normal iron level 68 -> 57.  Decreased energy x 2 weeks.  No melena. -Follow-up with hematologist Dr. Jeanie Sewer to consider IV iron infusions to maintain hemoglobin level greater than 12 and iron level >  60 -Repeat CBC and iron panel with PCP or hematology in 4 to 6 weeks  -If recurrence of melena or declining hemoglobin, plan for push enteroscopy and likely colonoscopy for diagnostic and therapeutic intent -Patient was instructed to present to the ED if she passes frequent loose black stools -Avoid spicy/acidic foods -No NSAIDs -Request copy of colonoscopy procedure and biopsy results from [facility]  History of hepatic steatosis.  At the end of today's office appointment, patient had questions regarding her history of hepatic steatosis and she questioned if she was a candidate for the "new fatty liver medication".  Normal LFTs. -Patient was advised to schedule a follow-up appointment in a few months to further discuss her history of hepatic steatosis and to consider an abdominal ultrasound with elastography to verify if she has evidence of fibrosis.  I briefly explained to the patient the Resmetirom is for patients who have MASH with moderate to advanced fibrosis.  -Patient was encouraged to reduce the carbohydrates in her diet: Reduce bread, pasta, rice, potatoes and sweets -Exercise is limited at this point as she has decreased energy for the past 2 weeks in the setting of chronic anemia  Today's encounter was 25 minutes which included precharting, chart/result review, history/exam, face-to-face time used for counseling, formulating a treatment plan with follow-up and documentation.

## 2022-07-01 NOTE — Progress Notes (Signed)
Agree with the assessment and plan as outlined by Colleen Kennedy-Smith, NP.   Saide Lanuza, DO, FACG [facility]   

## 2022-07-06 ENCOUNTER — Ambulatory Visit: Payer: Medicare HMO | Admitting: Physician Assistant

## 2022-07-14 ENCOUNTER — Ambulatory Visit (HOSPITAL_COMMUNITY)
Admission: EM | Admit: 2022-07-14 | Discharge: 2022-07-14 | Disposition: A | Discharge status: Home / Self Care | Payer: Medicare HMO | Attending: Emergency Medicine | Admitting: Emergency Medicine

## 2022-07-14 ENCOUNTER — Other Ambulatory Visit: Payer: Self-pay

## 2022-07-14 ENCOUNTER — Encounter (HOSPITAL_COMMUNITY): Payer: Self-pay | Admitting: Emergency Medicine

## 2022-07-14 DIAGNOSIS — Z20822 Contact with and (suspected) exposure to covid-19: Secondary | ICD-10-CM | POA: Insufficient documentation

## 2022-07-14 DIAGNOSIS — Z8616 Personal history of COVID-19: Secondary | ICD-10-CM | POA: Insufficient documentation

## 2022-07-14 DIAGNOSIS — B349 Viral infection, unspecified: Secondary | ICD-10-CM | POA: Diagnosis not present

## 2022-07-14 DIAGNOSIS — Z1152 Encounter for screening for COVID-19: Secondary | ICD-10-CM | POA: Insufficient documentation

## 2022-07-14 DIAGNOSIS — R197 Diarrhea, unspecified: Secondary | ICD-10-CM

## 2022-07-14 NOTE — ED Triage Notes (Signed)
Patient c/o fever, chills, HA and diarrhea since Saturday. Pt endorse took Tylenol at 1630.

## 2022-07-14 NOTE — ED Provider Notes (Signed)
[facility]    CSN: [id number] Arrival date & time: 07/14/22  1642      History   Chief Complaint Chief Complaint  Patient presents with   Fever    Patient c/o fever, chills, HA and diarrhea since Saturday. Pt endorse took Tylenol at 1630.    HPI Dana Nielsen is a 77 y.o. female.   Patient presents to clinic for concerns of diarrhea and fever.  Reports her friend is sick with similar symptoms and was recently tested for COVID-19, awaiting results.  She denies any nasal congestion, cough, shortness of breath or chest pain.  She did have a history of COVID-19 infection in January, was still testing positive in March.  Her fever was 100.5 at home and has had cold sweats.  She does have abdominal cramping with the diarrhea.  She is tolerating food and fluids, does not have any nausea or vomiting.  Denies blood in her diarrhea.  Denies any dysuria, flank pain or urinary changes.     The history is provided by the patient and medical records.  Fever Associated symptoms: diarrhea   Associated symptoms: no chest pain, no congestion, no cough, no nausea and no vomiting     Past Medical History:  Diagnosis Date   Endometrial mass    Family history of colon cancer 12/09/2020   Family history of lung cancer 12/09/2020   Family history of multiple myeloma 12/09/2020   Full dentures    GERD (gastroesophageal reflux disease)    H/O: upper GI bleed    las time 09-19-2021  s/p EGD with bleeding Dielufoy lesion of distal duodedum tx'd APC and clips;  previous gi bleed 03-08-2019  s/p EGD w/ hemastatis and clips   History of gastric ulcer    w/ upper GI bleed 03-08-2019 gastric ulcer nonbleeding and duodunal bleeding lesion   HLD (hyperlipidemia)    Hypothyroidism    followed by pcp   Iron deficiency anemia    hematologist--- dr Shela Commons. Leonides Schanz;   due to hx GI bleed and blood product refusal   MDD (major depressive disorder)    OA (osteoarthritis)    hands, knees   Ovarian  cyst, right    Refusal of blood transfusions as patient is Jehovah's Witness    Type 2 diabetes mellitus (HCC)    followed by pcp  (12-29-2021  per pt check blood sugar daily in am fasting, average 105--110)   Wears hearing aid in both ears     Patient Active Problem List   Diagnosis Date Noted   Body mass index (BMI) 35.0-35.9, adult 01/28/2022   Hyperglycemia due to type 2 diabetes mellitus (HCC) 01/28/2022   Long term (current) use of insulin (HCC) 01/28/2022   Hypomagnesemia 09/20/2021   Occult GI bleeding 09/19/2021   GI bleed 09/19/2021   Family history of lung cancer 12/09/2020   Family history of colon cancer 12/09/2020   Family history of multiple myeloma 12/09/2020   Seasonal allergic rhinitis due to pollen 04/06/2019   Arterial hypotension 03/20/2019   Labyrinthitis 03/20/2019   Iron deficiency anemia due to chronic blood loss 03/09/2019   Dieulafoy lesion of duodenum    Gastric ulcer without hemorrhage or perforation    Gastritis and gastroduodenitis    Hiatal hernia    Upper GI bleed 03/07/2019   Anemia 03/07/2019   Melena 03/05/2019   Primary insomnia 12/14/2018   Refusal of blood transfusions as patient is Jehovah's Witness 12/14/2018   Iron deficiency anemia 12/14/2018  Hypothyroidism 12/14/2018   Depression, major, single episode, mild (HCC) 12/14/2018   Controlled type 2 diabetes mellitus with complication, without long-term current use of insulin (HCC) 12/14/2018   Essential hypertension 12/14/2018   Vitamin D deficiency 12/14/2018   Elevated cholesterol 12/14/2018   Cyst of right ovary 03/22/2018   Depression 02/10/2018   Osteoarthritis 02/10/2018   Neck pain 05/24/2017   Low back pain 11/17/2016   Dyslipidemia 10/23/2013    Past Surgical History:  Procedure Laterality Date   BIOPSY  03/08/2019   Procedure: BIOPSY;  Surgeon: Shellia Cleverly, DO;  Location: [facility];  Service: Gastroenterology;;   CATARACT EXTRACTION Left 05/2022    [place]  [place]   [place]2 [place] and in [place]   DILATATION & CURETTAGE/HYSTEROSCOPY WITH MYOSURE N/A 12/30/2021   Procedure: DILATATION & CURETTAGE/HYSTEROSCOPY WITH MYOSURE;  Surgeon: Steva Ready, DO;  Location: [facility] [place];  Service: Gynecology;  Laterality: N/A;   DILATION AND CURETTAGE OF UTERUS     yrs ago   ENTEROSCOPY N/A 03/08/2019   Procedure: ENTEROSCOPY;  Surgeon: Shellia Cleverly, DO;  Location: [facility];  Service: Gastroenterology;  Laterality: N/A;   ENTEROSCOPY N/A 09/19/2021   Procedure: ENTEROSCOPY;  Surgeon: Benancio Deeds, MD;  Location: [facility];  Service: Gastroenterology;  Laterality: N/A;   HEMOSTASIS CLIP PLACEMENT  03/08/2019   Procedure: HEMOSTASIS CLIP PLACEMENT;  Surgeon: Shellia Cleverly, DO;  Location: [facility];  Service: Gastroenterology;;   HEMOSTASIS CLIP PLACEMENT  09/19/2021   Procedure: HEMOSTASIS CLIP PLACEMENT;  Surgeon: Benancio Deeds, MD;  Location: [facility];  Service: Gastroenterology;;   HOT HEMOSTASIS N/A 09/19/2021   Procedure: HOT HEMOSTASIS (ARGON PLASMA COAGULATION/BICAP);  Surgeon: Benancio Deeds, MD;  Location: [facility];  Service: Gastroenterology;  Laterality: N/A;   KNEE ARTHROSCOPY Left 2003   LAPAROSCOPIC BILATERAL SALPINGO OOPHERECTOMY Bilateral 12/30/2021   Procedure: LAPAROSCOPIC BILATERAL SALPINGO OOPHORECTOMY;  Surgeon: Steva Ready, DO;  Location: [facility] [place];  Service: Gynecology;  Laterality: Bilateral;   SUBMUCOSAL TATTOO INJECTION  03/08/2019   Procedure: SUBMUCOSAL TATTOO INJECTION;  Surgeon: Shellia Cleverly, DO;  Location: [facility];  Service: Gastroenterology;;   SUBMUCOSAL TATTOO INJECTION  09/19/2021   Procedure: SUBMUCOSAL TATTOO INJECTION;  Surgeon: Benancio Deeds, MD;  Location: [facility];  Service: Gastroenterology;;   TONSILLECTOMY     child   TUBAL LIGATION Bilateral 1990   WISDOM TOOTH EXTRACTION     WRIST  FRACTURE SURGERY Right 2013   per pt for crush injury,  no hardware    OB History   No obstetric history on file.      Home Medications    Prior to Admission medications   Medication Sig Start Date End Date Taking? Authorizing Provider  AMBULATORY NON FORMULARY MEDICATION Take 2 capsules by mouth daily. Medication Name: FLORADIX (iron supplement)    [provider]  cetirizine (ZYRTEC) 10 MG tablet Take 10 mg by mouth at bedtime.    [provider]  Cyanocobalamin (VITAMIN B-12 PO) Take 1 tablet by mouth in the morning.    [provider]  furosemide (LASIX) 20 MG tablet Take 20 mg by mouth daily as needed.    [provider]  glucose blood (ACCU-CHEK AVIVA PLUS) test strip Use to test your blood sugar In Vitro twice a day    [provider]  losartan (COZAAR) 25 MG tablet Take 25 mg by mouth in the morning.    [provider]  magnesium  oxide (MAG-OX) 400 (240 Mg) MG tablet Take 1 tablet (400 mg total) by mouth daily. Patient taking differently: Take 400 mg by mouth in the morning. 02/11/21   Jaci Standard, MD  metFORMIN (GLUCOPHAGE) 1000 MG tablet Take 1,000 mg by mouth 2 (two) times daily with a meal.    [provider]  Multiple Vitamins-Minerals (HAIR/SKIN/NAILS) CAPS Take 1 capsule by mouth in the morning.    [provider]  Multiple Vitamins-Minerals (ONE-A-DAY WOMENS 50+) TABS Take 1 tablet by mouth in the morning.    [provider]  omeprazole (PRILOSEC) 40 MG capsule Take 40 mg by mouth in the morning. 09/16/21   [provider]  oxyCODONE (OXY IR/ROXICODONE) 5 MG immediate release tablet Take 1 tablet (5 mg total) by mouth every 6 (six) hours as needed for severe pain or breakthrough pain. Patient not taking: Reported on 06/22/2022 12/30/21   Steva Ready, DO  simvastatin (ZOCOR) 40 MG tablet Take 1 tablet (40 mg total) by mouth daily at 6 PM. Patient taking differently: Take 40 mg by  mouth at bedtime. 04/06/19   Mliss Sax, MD  SYNTHROID 100 MCG tablet Take 100 mcg by mouth daily before breakfast.    [provider]  traZODone (DESYREL) 100 MG tablet TAKE 1 TABLET BY MOUTH AT BEDTIME Patient taking differently: Take 100 mg by mouth at bedtime. 09/20/19   Mliss Sax, MD  venlafaxine XR (EFFEXOR-XR) 37.5 MG 24 hr capsule Take 37.5 mg by mouth in the morning. Patient not taking: Reported on 06/22/2022    [provider]  VICTOZA 18 MG/3ML SOPN Inject 1.8 mg into the skin in the morning. 09/10/21   [provider]    Family History Family History  Problem Relation Age of Onset   Multiple myeloma Mother 1   Heart attack Father    Lung cancer Sister 21       smoking hx   Leukemia Paternal Aunt        dx before 28   Cancer Paternal Aunt        unknown type; dx after 21   Colon cancer Paternal Aunt        dx after 50   Head & neck cancer Paternal Uncle        dx before 52   Cancer Paternal Uncle        unknown type; dx after 50   Cancer Paternal Uncle        color or gastric; dx after 64    Social History Social History   Tobacco Use   Smoking status: Never   Smokeless tobacco: Never  Vaping Use   Vaping Use: Never used  Substance Use Topics   Alcohol use: Not Currently   Drug use: Never     Allergies   Lidocaine and Macrobid [nitrofurantoin]   Review of Systems Review of Systems  Constitutional:  Positive for fever.  HENT:  Negative for congestion.   Respiratory:  Negative for cough and shortness of breath.   Cardiovascular:  Negative for chest pain.  Gastrointestinal:  Positive for abdominal pain and diarrhea. Negative for nausea and vomiting.     Physical Exam Triage Vital Signs ED Triage Vitals  Enc Vitals Group     BP 07/14/22 1724 (!) 150/72     Pulse Rate 07/14/22 1724 89     Resp 07/14/22 1724 18     Temp 07/14/22 1724 98.3 F (36.8 C)  Temp Source 07/14/22 1724 Oral     SpO2  07/14/22 1724 98 %     Weight --      Height --      Head Circumference --      Peak Flow --      Pain Score 07/14/22 1725 0     Pain Loc --      Pain Edu? --      Excl. in GC? --    No data found.  Updated Vital Signs BP (!) 150/72 (BP Location: Right Arm)   Pulse 89   Temp 98.3 F (36.8 C) (Oral)   Resp 18   SpO2 98%   Visual Acuity Right Eye Distance:   Left Eye Distance:   Bilateral Distance:    Right Eye Near:   Left Eye Near:    Bilateral Near:     Physical Exam Vitals and nursing note reviewed.  Constitutional:      Appearance: Normal appearance.  HENT:     Head: Normocephalic and atraumatic.     Right Ear: External ear normal.     Left Ear: External ear normal.     Nose: Nose normal.     Mouth/Throat:     Mouth: Mucous membranes are moist.  Eyes:     Conjunctiva/sclera: Conjunctivae normal.  Cardiovascular:     Rate and Rhythm: Normal rate and regular rhythm.     Heart sounds: Normal heart sounds. No murmur heard. Pulmonary:     Effort: Pulmonary effort is normal. No respiratory distress.     Breath sounds: Normal breath sounds.  Abdominal:     General: Abdomen is flat. Bowel sounds are normal. There is no distension.     Palpations: Abdomen is soft. There is no mass.     Tenderness: There is no abdominal tenderness. There is no guarding or rebound.     Hernia: No hernia is present.  Musculoskeletal:        General: Normal range of motion.  Skin:    General: Skin is warm and dry.  Neurological:     General: No focal deficit present.     Mental Status: She is alert and oriented to person, place, and time.  Psychiatric:        Mood and Affect: Mood normal.      UC Treatments / Results  Labs (all labs ordered are listed, but only abnormal results are displayed) Labs Reviewed  SARS CORONAVIRUS 2 (TAT 6-24 HRS)    EKG   Radiology No results found.  Procedures Procedures (including critical care time)  Medications Ordered in  UC Medications - No data to display  Initial Impression / Assessment and Plan / UC Course  I have reviewed the triage vital signs and the nursing notes.  Pertinent labs & imaging results that were available during my care of the patient were reviewed by me and considered in my medical decision making (see chart for details).  Vitals and triage reviewed, patient is hemodynamically stable.  Lungs vesicular posteriorly, heart with RRR, abdomen with active bowel sounds and is soft and nontender.  Patient complains of diarrhea and fever, body aches and feeling unwell since Friday/Saturday.  Friend is sick with similar symptoms.  Will test for COVID-19, patient would like antiviral if positive.  Advised increasing hydration and brat diet for symptomatic management of diarrhea.  Plan of care, follow-up care and return precautions given, no questions at this time.     Final Clinical Impressions(s) /  UC Diagnoses   Final diagnoses:  Diarrhea, unspecified type  Viral illness     Discharge Instructions      We have swabbed you for COVID-19 today in clinic and we will contact you if anything results is positive.  In the meantime, if you develop fever you can take 500 mg of Tylenol.  Please ensure you are staying hydrated with at least 64 ounces of water.  He can consider adding an electrolyte solution like Pedialyte or liquid IV.  I also suggest a bland diet such as bananas, toast, rice and applesauce to help relieve your diarrhea.  Please seek immediate care if you develop fever despite medication, loss of consciousness, blood in your diarrhea, or any new concerning symptoms.      ED Prescriptions   None    PDMP not reviewed this encounter.   Ahnaf Caponi, [place], [place] 07/14/22 [phone]

## 2022-07-14 NOTE — Discharge Instructions (Addendum)
We have swabbed you for COVID-19 today in clinic and we will contact you if anything results is positive.  In the meantime, if you develop fever you can take 500 mg of Tylenol.  Please ensure you are staying hydrated with at least 64 ounces of water.  He can consider adding an electrolyte solution like Pedialyte or liquid IV.  I also suggest a bland diet such as bananas, toast, rice and applesauce to help relieve your diarrhea.  Please seek immediate care if you develop fever despite medication, loss of consciousness, blood in your diarrhea, or any new concerning symptoms.

## 2022-07-15 LAB — SARS CORONAVIRUS 2 (TAT 6-24 HRS): SARS Coronavirus 2: NEGATIVE

## 2022-08-02 ENCOUNTER — Other Ambulatory Visit: Payer: Self-pay | Admitting: Hematology and Oncology

## 2022-08-02 ENCOUNTER — Other Ambulatory Visit: Payer: Self-pay

## 2022-08-02 ENCOUNTER — Encounter: Payer: Self-pay | Admitting: Physician Assistant

## 2022-08-02 ENCOUNTER — Inpatient Hospital Stay: Payer: Medicare HMO | Admitting: Hematology and Oncology

## 2022-08-02 ENCOUNTER — Inpatient Hospital Stay: Payer: Medicare HMO | Attending: Hematology and Oncology

## 2022-08-02 DIAGNOSIS — D500 Iron deficiency anemia secondary to blood loss (chronic): Secondary | ICD-10-CM | POA: Diagnosis present

## 2022-08-02 DIAGNOSIS — Z801 Family history of malignant neoplasm of trachea, bronchus and lung: Secondary | ICD-10-CM | POA: Diagnosis not present

## 2022-08-02 DIAGNOSIS — K922 Gastrointestinal hemorrhage, unspecified: Secondary | ICD-10-CM | POA: Diagnosis not present

## 2022-08-02 DIAGNOSIS — Z806 Family history of leukemia: Secondary | ICD-10-CM | POA: Insufficient documentation

## 2022-08-02 LAB — CBC WITH DIFFERENTIAL (CANCER CENTER ONLY)
Abs Immature Granulocytes: 0.01 10*3/uL (ref 0.00–0.07)
Basophils Absolute: 0.1 10*3/uL (ref 0.0–0.1)
Basophils Relative: 1 %
Eosinophils Absolute: 0.3 10*3/uL (ref 0.0–0.5)
Eosinophils Relative: 4 %
HCT: 39.8 % (ref 36.0–46.0)
Hemoglobin: 12.8 g/dL (ref 12.0–15.0)
Immature Granulocytes: 0 %
Lymphocytes Relative: 33 %
Lymphs Abs: 2.2 10*3/uL (ref 0.7–4.0)
MCH: 27.8 pg (ref 26.0–34.0)
MCHC: 32.2 g/dL (ref 30.0–36.0)
MCV: 86.3 fL (ref 80.0–100.0)
Monocytes Absolute: 0.6 10*3/uL (ref 0.1–1.0)
Monocytes Relative: 9 %
Neutro Abs: 3.5 10*3/uL (ref 1.7–7.7)
Neutrophils Relative %: 53 %
Platelet Count: 235 10*3/uL (ref 150–400)
RBC: 4.61 MIL/uL (ref 3.87–5.11)
RDW: 15.8 % — ABNORMAL HIGH (ref 11.5–15.5)
WBC Count: 6.6 10*3/uL (ref 4.0–10.5)
nRBC: 0 % (ref 0.0–0.2)

## 2022-08-02 LAB — CMP (CANCER CENTER ONLY)
ALT: 19 U/L (ref 0–44)
AST: 18 U/L (ref 15–41)
Albumin: 4.5 g/dL (ref 3.5–5.0)
Alkaline Phosphatase: 52 U/L (ref 38–126)
Anion gap: 8 (ref 5–15)
BUN: 16 mg/dL (ref 8–23)
CO2: 28 mmol/L (ref 22–32)
Calcium: 10.3 mg/dL (ref 8.9–10.3)
Chloride: 105 mmol/L (ref 98–111)
Creatinine: 1.02 mg/dL — ABNORMAL HIGH (ref 0.44–1.00)
GFR, Estimated: 57 mL/min — ABNORMAL LOW (ref >60)
Glucose, Bld: 133 mg/dL — ABNORMAL HIGH (ref 70–99)
Potassium: 4.1 mmol/L (ref 3.5–5.1)
Sodium: 141 mmol/L (ref 135–145)
Total Bilirubin: 0.5 mg/dL (ref 0.3–1.2)
Total Protein: 7.2 g/dL (ref 6.5–8.1)

## 2022-08-02 LAB — IRON AND IRON BINDING CAPACITY (CC-WL,HP ONLY)
Iron: 57 ug/dL (ref 28–170)
Saturation Ratios: 15 % (ref 10.4–31.8)
TIBC: 378 ug/dL (ref 250–450)
UIBC: 321 ug/dL (ref 148–442)

## 2022-08-02 LAB — RETIC PANEL
Immature Retic Fract: 11 % (ref 2.3–15.9)
RBC.: 4.54 MIL/uL (ref 3.87–5.11)
Retic Count, Absolute: 64.9 10*3/uL (ref 19.0–186.0)
Retic Ct Pct: 1.4 % (ref 0.4–3.1)
Reticulocyte Hemoglobin: 30.7 pg (ref >27.9)

## 2022-08-02 LAB — MAGNESIUM: Magnesium: 1.4 mg/dL — ABNORMAL LOW (ref 1.7–2.4)

## 2022-08-02 NOTE — Addendum Note (Signed)
Addended by: Mertha Finders A on: 08/02/2022 04:24 PM   Modules accepted: Orders

## 2022-08-02 NOTE — Progress Notes (Signed)
[facility] [phone]   [phone]  PROGRESS NOTE  Patient Care Team: Tracey Harries, MD as PCP - General (Family Medicine)  Hematological/Oncological History #Iron Deficiency Anemia 2/2 to GI Bleeding 1) 2019: presented to [facility] in [facility] with symptoms of anemia. Found to have Hgb 5.0. EGD/colon/capsule reported found no source of bleed 2) 01/18/2019: establish care with Dr. Leonides Schanz  3) 03/08/2019: admitted for acute GI bleed. Endoscopy revealed an actively bleeding Dieulafoy lesion that was clipped. Received IV feraheme 510mg  x 1 dose during admission 4) 03/15/2019: received dose 2 of IV feraheme 510mg  in outpatient setting. 5) 04/13/2019: WBC 8.6, Hgb 12.2, MCV 92.1, Plt 216 6) 07/19/2019: WBC 7.1, Hgb 12.2, MCV 90.6, Plt 226 7) 09/20/2021-09/21/2021: IV Ferrleciti 250 mg x 2 doses 8) 10/15/2021-1012/2023: IV venofer 200 mg x 2 doses 9) 08/02/2022: WBC 6.6, Hgb 12.8, MCV 86.3, Plt 235  Interval History:  Dana Nielsen 77 y.o. female with medical history significant for Dieulafoy lesion bleeding with iron deficiency anemia who presents for a follow up visit. The patient's last visit was on 02/01/2022. In the interim since the last visit she reports she has had no major changes in her health.  On exam today [name] reports she has been well overall in the interim since her last visit.  She reports that she did have a slight drop in her hemoglobin down to 10.5 with her primary care provider.  She began taking Floradix and notes she is tolerating it well without any constipation or stomach upset.  She notes that she is eating well with a strong appetite.  Her energy levels are poor but that is because of her sleep habits.  She notes sometimes she will go to bed very late, as late as 3:00 in the morning.  She does however get a full 8 hours of sleep.  She notes that she is not having any issues with lightheadedness, dizziness or shortness of breath.  She  has had no recent infectious symptoms such as fevers, chills, sweats.  Unfortunately a friend of hers recently passed away after slipping and falling in the shower and suffering 8 bone fractures.  The patient reports that her air conditioning was out for the last week and she feels like she did not keep up with hydration.  She is otherwise at her baseline level of health.  Patient denies fevers, chills, night sweats, shortness of breath, chest pain or cough.  She has no other complaints. A full 10 point ROS is listed below.  MEDICAL HISTORY:  Past Medical History:  Diagnosis Date   Endometrial mass    Family history of colon cancer 12/09/2020   Family history of lung cancer 12/09/2020   Family history of multiple myeloma 12/09/2020   Full dentures    GERD (gastroesophageal reflux disease)    H/O: upper GI bleed    las time 09-19-2021  s/p EGD with bleeding Dielufoy lesion of distal duodedum tx'd APC and clips;  previous gi bleed 03-08-2019  s/p EGD w/ hemastatis and clips   History of gastric ulcer    w/ upper GI bleed 03-08-2019 gastric ulcer nonbleeding and duodunal bleeding lesion   HLD (hyperlipidemia)    Hypothyroidism    followed by pcp   Iron deficiency anemia    hematologist--- dr j. Leonides Schanz;   due to hx GI bleed and blood product refusal   MDD (major depressive disorder)    OA (osteoarthritis)    hands, knees  Ovarian cyst, right    Refusal of blood transfusions as patient is Jehovah's Witness    Type 2 diabetes mellitus (HCC)    followed by pcp  (12-29-2021  per pt check blood sugar daily in am fasting, average 105--110)   Wears hearing aid in both ears     SURGICAL HISTORY: Past Surgical History:  Procedure Laterality Date   BIOPSY  03/08/2019   Procedure: BIOPSY;  Surgeon: Shellia Cleverly, DO;  Location: [facility];  Service: Gastroenterology;;   CATARACT EXTRACTION Left 05/2022   [place]  [place] in [place]   DILATATION &  CURETTAGE/HYSTEROSCOPY WITH MYOSURE N/A 12/30/2021   Procedure: DILATATION & CURETTAGE/HYSTEROSCOPY WITH MYOSURE;  Surgeon: Steva Ready, DO;  Location: [facility];  Service: Gynecology;  Laterality: N/A;   DILATION AND CURETTAGE OF UTERUS     yrs ago   ENTEROSCOPY N/A 03/08/2019   Procedure: ENTEROSCOPY;  Surgeon: Shellia Cleverly, DO;  Location: [facility];  Service: Gastroenterology;  Laterality: N/A;   ENTEROSCOPY N/A 09/19/2021   Procedure: ENTEROSCOPY;  Surgeon: Benancio Deeds, MD;  Location: [facility];  Service: Gastroenterology;  Laterality: N/A;   HEMOSTASIS CLIP PLACEMENT  03/08/2019   Procedure: HEMOSTASIS CLIP PLACEMENT;  Surgeon: Shellia Cleverly, DO;  Location: [facility];  Service: Gastroenterology;;   HEMOSTASIS CLIP PLACEMENT  09/19/2021   Procedure: HEMOSTASIS CLIP PLACEMENT;  Surgeon: Benancio Deeds, MD;  Location: [facility];  Service: Gastroenterology;;   HOT HEMOSTASIS N/A 09/19/2021   Procedure: HOT HEMOSTASIS (ARGON PLASMA COAGULATION/BICAP);  Surgeon: Benancio Deeds, MD;  Location: [facility];  Service: Gastroenterology;  Laterality: N/A;   KNEE ARTHROSCOPY Left 2003   LAPAROSCOPIC BILATERAL SALPINGO OOPHERECTOMY Bilateral 12/30/2021   Procedure: LAPAROSCOPIC BILATERAL SALPINGO OOPHORECTOMY;  Surgeon: Steva Ready, DO;  Location: [facility];  Service: Gynecology;  Laterality: Bilateral;   SUBMUCOSAL TATTOO INJECTION  03/08/2019   Procedure: SUBMUCOSAL TATTOO INJECTION;  Surgeon: Shellia Cleverly, DO;  Location: [facility];  Service: Gastroenterology;;   SUBMUCOSAL TATTOO INJECTION  09/19/2021   Procedure: SUBMUCOSAL TATTOO INJECTION;  Surgeon: Benancio Deeds, MD;  Location: [facility];  Service: Gastroenterology;;   TONSILLECTOMY     child   TUBAL LIGATION Bilateral 1990   WISDOM TOOTH EXTRACTION     WRIST FRACTURE SURGERY Right 2013   per pt for crush injury,  no hardware     SOCIAL HISTORY: Social History   Socioeconomic History   Marital status: Widowed    Spouse name: Not on file   Number of children: 9   Years of education: Not on file   Highest education level: Not on file  Occupational History   Not on file  Tobacco Use   Smoking status: Never   Smokeless tobacco: Never  Vaping Use   Vaping status: Never Used  Substance and Sexual Activity   Alcohol use: Not Currently   Drug use: Never   Sexual activity: Not on file  Other Topics Concern   Not on file  Social History Narrative   Not on file   Social Determinants of Health   Financial Resource Strain: Low Risk  (07/20/2022)   Received [facility]   Overall Financial Resource Strain (CARDIA)    Difficulty of Paying Living Expenses: Not hard at all  Food Insecurity: No Food Insecurity (07/20/2022)   Received from [facility]   Hunger Vital Sign    Worried About Running Out of Food  in the Last Year: Never true    Ran Out of Food in the Last Year: Never true  Transportation Needs: No Transportation Needs (07/20/2022)   Received from [facility] - Transportation    Lack of Transportation (Medical): No    Lack of Transportation (Non-Medical): No  Physical Activity: Unknown (07/20/2022)   Received from [facility]   Exercise Vital Sign    Days of Exercise per Week: 0 days    Minutes of Exercise per Session: Not on file  Stress: Stress Concern Present (07/20/2022)   Received from [facility] - Occupational Stress Questionnaire    Feeling of Stress : Very much  Social Connections: Socially Integrated (07/20/2022)   Received from [facility]   Social Network    How would you rate your social network (family, work, friends)?: Good participation with social networks  Intimate Partner Violence: Not At Risk (07/20/2022)   Received from [facility]   HITS    Over the last 12 months how often did your partner physically hurt  you?: 1    Over the last 12 months how often did your partner insult you or talk down to you?: 1    Over the last 12 months how often did your partner threaten you with physical harm?: 1    Over the last 12 months how often did your partner scream or curse at you?: 1    FAMILY HISTORY: Family History  Problem Relation Age of Onset   Multiple myeloma Mother 45   Heart attack Father    Lung cancer Sister 60       smoking hx   Leukemia Paternal Aunt        dx before 65   Cancer Paternal Aunt        unknown type; dx after 30   Colon cancer Paternal Aunt        dx after 50   Head & neck cancer Paternal Uncle        dx before 52   Cancer Paternal Uncle        unknown type; dx after 50   Cancer Paternal Uncle        color or gastric; dx after 50    ALLERGIES:  is allergic to lidocaine and macrobid [nitrofurantoin].  MEDICATIONS:  Current Outpatient Medications  Medication Sig Dispense Refill   AMBULATORY NON FORMULARY MEDICATION Take 2 capsules by mouth daily. Medication Name: FLORADIX (iron supplement)     cetirizine (ZYRTEC) 10 MG tablet Take 10 mg by mouth at bedtime.     Cyanocobalamin (VITAMIN B-12 PO) Take 1 tablet by mouth in the morning.     furosemide (LASIX) 20 MG tablet Take 20 mg by mouth daily as needed.     glucose blood (ACCU-CHEK AVIVA PLUS) test strip Use to test your blood sugar In Vitro twice a day     losartan (COZAAR) 25 MG tablet Take 25 mg by mouth in the morning.     magnesium oxide (MAG-OX) 400 (240 Mg) MG tablet Take 1 tablet (400 mg total) by mouth daily. (Patient taking differently: Take 400 mg by mouth in the morning.) 30 tablet 1   metFORMIN (GLUCOPHAGE) 1000 MG tablet Take 1,000 mg by mouth 2 (two) times daily with a meal.     Multiple Vitamins-Minerals (HAIR/SKIN/NAILS) CAPS Take 1 capsule by mouth in the morning.     Multiple Vitamins-Minerals (ONE-A-DAY WOMENS 50+) TABS Take 1  tablet by mouth in the morning.     omeprazole (PRILOSEC) 40 MG capsule  Take 40 mg by mouth in the morning.     oxyCODONE (OXY IR/ROXICODONE) 5 MG immediate release tablet Take 1 tablet (5 mg total) by mouth every 6 (six) hours as needed for severe pain or breakthrough pain. (Patient not taking: Reported on 06/22/2022) 12 tablet 0   simvastatin (ZOCOR) 40 MG tablet Take 1 tablet (40 mg total) by mouth daily at 6 PM. (Patient taking differently: Take 40 mg by mouth at bedtime.) 90 tablet 3   SYNTHROID 100 MCG tablet Take 100 mcg by mouth daily before breakfast.     traZODone (DESYREL) 100 MG tablet TAKE 1 TABLET BY MOUTH AT BEDTIME (Patient taking differently: Take 100 mg by mouth at bedtime.) 90 tablet 0   venlafaxine XR (EFFEXOR-XR) 37.5 MG 24 hr capsule Take 37.5 mg by mouth in the morning. (Patient not taking: Reported on 06/22/2022)     VICTOZA 18 MG/3ML SOPN Inject 1.8 mg into the skin in the morning.     No current facility-administered medications for this visit.    REVIEW OF SYSTEMS:   Constitutional: ( - ) fevers, ( - )  chills , ( - ) night sweats Eyes: ( - ) blurriness of vision, ( - ) double vision, ( - ) watery eyes Ears, nose, mouth, throat, and face: ( - ) mucositis, ( - ) sore throat Respiratory: ( - ) cough, ( - ) dyspnea, ( - ) wheezes Cardiovascular: ( - ) palpitation, ( - ) chest discomfort, ( - ) lower extremity swelling Gastrointestinal:  ( - ) nausea, ( - ) heartburn, ( - ) change in bowel habits Skin: ( - ) abnormal skin rashes Lymphatics: ( - ) new lymphadenopathy, ( - ) easy bruising Neurological: ( - ) numbness, ( - ) tingling, ( - ) new weaknesses Behavioral/Psych: ( - ) mood change, ( - ) new changes  All other systems were reviewed with the patient and are negative.  PHYSICAL EXAMINATION: ECOG PERFORMANCE STATUS: 1 - Symptomatic but completely ambulatory  Vitals:   08/02/22 1531  BP: (!) 141/75  Pulse: 85  Resp: 16  Temp: 97.6 F (36.4 C)  SpO2: 98%     Filed Weights   08/02/22 1531  Weight: 190 lb 8 oz (86.4 kg)       GENERAL:well appearing elderly Caucasian female alert, no distress and comfortable SKIN: skin color, texture, turgor are normal, no rashes or significant lesions EYES: conjunctiva are pink and non-injected, sclera clear LUNGS: clear to auscultation and percussion with normal breathing effort HEART: regular rate & rhythm and no murmurs and no lower extremity edema Musculoskeletal: no cyanosis of digits and no clubbing  PSYCH: alert & oriented x 3, fluent speech NEURO: no focal motor/sensory deficits  LABORATORY DATA:  I have reviewed the data as listed    Latest Ref Rng & Units 08/02/2022    3:01 PM 05/03/2022    1:12 PM 04/14/2022   12:38 PM  CBC  WBC 4.0 - 10.5 K/uL 6.6  6.0  6.9   Hemoglobin 12.0 - 15.0 g/dL 81.1  91.4  78.2   Hematocrit 36.0 - 46.0 % 39.8  35.7  35.9   Platelets 150 - 400 K/uL 235  245  373        Latest Ref Rng & Units 08/02/2022    3:01 PM 05/03/2022    1:12 PM 04/14/2022   12:38 PM  CMP  Glucose 70 - 99 mg/dL 161  99  096   BUN 8 - 23 mg/dL 16  14  20    Creatinine 0.44 - 1.00 mg/dL 0.45  4.09  8.11   Sodium 135 - 145 mmol/L 141  143  141   Potassium 3.5 - 5.1 mmol/L 4.1  4.3  4.4   Chloride 98 - 111 mmol/L 105  107  105   CO2 22 - 32 mmol/L 28  29  28    Calcium 8.9 - 10.3 mg/dL 91.4  9.8  78.2   Total Protein 6.5 - 8.1 g/dL 7.2  7.5  7.7   Total Bilirubin 0.3 - 1.2 mg/dL 0.5  0.3  0.3   Alkaline Phos 38 - 126 U/L 52  53  66   AST 15 - 41 U/L 18  13  14    ALT 0 - 44 U/L 19  12  15        RADIOGRAPHIC STUDIES: No results found.  ASSESSMENT & PLAN Dana Nielsen 77 y.o. female with medical history significant for Dieulafoy lesion bleeding with iron deficiency anemia who presents for a follow up visit.   #Iron Deficiency Anemia 2/2 to GI Bleeding --no evidence of bleeding on today's exam.  --most recent small bowel endoscopy from 09/19/2021 showed dieulafoy lesion with bleeding in the distal duodenum, a few cms distal to prior tattoo.Treated  with argon plasma coagulation --last received IV venofer 200 mg x 2 doses on 10/15/2021 and 10/22/2021.  --unable to tolerate PO iron due to chronic constipation.  --labs today shows white blood cell 6.6, Hgb 12.8, MCV 86.3, Plt 235 --No need for IV iron at this time.  --RTC in 3 months for labs and 6 months for clinic visits.   # Hypomagnesemia -- Patient has low levels of magnesium, unclear etiology.  May be secondary to diarrhea bouts --Patient takes 400 mg p.o. of magnesium daily.   --magnesium levels today pending. Last 1.5 on 04/14/2022.  --recommend to continue magnesium supplement.   #Jehovah's Witness --the patient is not to receive blood products under any circumstances, per her request. We have copied and uploaded her Blood Contract which she carries into her wallet into our system. --in the event the patient is admitted with severe anemia or bleed please make the Hematology service aware so that we may provide bloodless options for support.    Orders Placed This Encounter  Procedures   Magnesium    Standing Status:   Future    Number of Occurrences:   1    Standing Expiration Date:   08/02/2023   All questions were answered. The patient knows to call the clinic with any problems, questions or concerns.  I have spent a total of 30 minutes minutes of face-to-face and non-face-to-face time, preparing to see the patient, performing a medically appropriate examination, counseling and educating the patient, ordering medications, documenting clinical information in the electronic health record, and care coordination.   Ulysees Barns, MD Department of Hematology/Oncology [facility] Phone: [phone] Pager: [phone] Email: [email or web address]  08/02/2022 4:07 PM

## 2022-08-03 LAB — FERRITIN: Ferritin: 193 ng/mL (ref 11–307)

## 2022-08-29 ENCOUNTER — Encounter: Payer: Self-pay | Admitting: Physician Assistant

## 2022-09-21 ENCOUNTER — Encounter: Payer: Self-pay | Admitting: Physician Assistant

## 2022-09-28 ENCOUNTER — Ambulatory Visit: Payer: Medicare HMO | Admitting: Gastroenterology

## 2022-10-09 NOTE — Progress Notes (Signed)
Seen by casting department

## 2022-10-14 ENCOUNTER — Other Ambulatory Visit (INDEPENDENT_AMBULATORY_CARE_PROVIDER_SITE_OTHER): Payer: Medicare PPO

## 2022-10-14 ENCOUNTER — Other Ambulatory Visit: Payer: Self-pay

## 2022-10-14 ENCOUNTER — Telehealth: Payer: Self-pay | Admitting: Nurse Practitioner

## 2022-10-14 DIAGNOSIS — R195 Other fecal abnormalities: Secondary | ICD-10-CM

## 2022-10-14 DIAGNOSIS — Z8719 Personal history of other diseases of the digestive system: Secondary | ICD-10-CM

## 2022-10-14 NOTE — Telephone Encounter (Signed)
Inbound call from patient stating she recently had dark stool. Patient is requesting to have a stool test completed. Patient is requesting a call to discuss further. Please advise, thank you.

## 2022-10-14 NOTE — Telephone Encounter (Signed)
Spoke with patient regarding NP recommendations. She is on her way to our office now to have labs & stool kit collected prior to [place] closing. She will come in on Monday for an appointment with Colleen at 3:30 pm. Discussed ED precautions. Pt verbalized all understanding.

## 2022-10-14 NOTE — Telephone Encounter (Signed)
Patient called in with concerns for dark stool that started yesterday. She's requesting a stool test. States her stool isn't black, but very dark and given her history of GIB she does not want to have to go to ED if she can help it. BM once yesterday & today. She is also having some constipation & has noticed that her BP is up, despite starting a new medication last week. Denies pain, n/v. PCP is out of the office & so she was recommended to contact us. Last seen for OV 06/2022 with Colleen.

## 2022-10-14 NOTE — Telephone Encounter (Signed)
Dana Nielsen, patient with history of GI bleed. If she is passing frequent loose black tarry stool or if she is experiencing any chest pain, dizziness, palpitations or profound fatigue she needs to go to the ED. Please see if any APP has availability to see her tomorrow, check for am cancellations,  if not, you can add her to my schedule on Monday at 3:30pm. Send her to our lab for a CBC, CMP, IBC + ferritin and stool cards x 3.

## 2022-10-15 ENCOUNTER — Encounter: Payer: Self-pay | Admitting: Physician Assistant

## 2022-10-15 ENCOUNTER — Telehealth: Payer: Self-pay | Admitting: Nurse Practitioner

## 2022-10-15 LAB — CBC
HCT: 38.1 % (ref 36.0–46.0)
Hemoglobin: 12.4 g/dL (ref 12.0–15.0)
MCHC: 32.5 g/dL (ref 30.0–36.0)
MCV: 88.2 fL (ref 78.0–100.0)
Platelets: 244 10*3/uL (ref 150.0–400.0)
RBC: 4.32 Mil/uL (ref 3.87–5.11)
RDW: 15.4 % (ref 11.5–15.5)
WBC: 8.7 10*3/uL (ref 4.0–10.5)

## 2022-10-15 LAB — COMPREHENSIVE METABOLIC PANEL
ALT: 25 U/L (ref 0–35)
AST: 24 U/L (ref 0–37)
Albumin: 4.5 g/dL (ref 3.5–5.2)
Alkaline Phosphatase: 51 U/L (ref 39–117)
BUN: 25 mg/dL — ABNORMAL HIGH (ref 6–23)
CO2: 28 meq/L (ref 19–32)
Calcium: 10.2 mg/dL (ref 8.4–10.5)
Chloride: 100 meq/L (ref 96–112)
Creatinine, Ser: 0.95 mg/dL (ref 0.40–1.20)
GFR: 57.69 mL/min — ABNORMAL LOW (ref >60.00)
Glucose, Bld: 133 mg/dL — ABNORMAL HIGH (ref 70–99)
Potassium: 4.3 meq/L (ref 3.5–5.1)
Sodium: 141 meq/L (ref 135–145)
Total Bilirubin: 0.4 mg/dL (ref 0.2–1.2)
Total Protein: 7.4 g/dL (ref 6.0–8.3)

## 2022-10-15 LAB — IBC + FERRITIN
Ferritin: 171.8 ng/mL (ref 10.0–291.0)
Iron: 65 ug/dL (ref 42–145)
Saturation Ratios: 16 % — ABNORMAL LOW (ref 20.0–50.0)
TIBC: 407.4 ug/dL (ref 250.0–450.0)
Transferrin: 291 mg/dL (ref 212.0–360.0)

## 2022-10-15 NOTE — Telephone Encounter (Signed)
Inbound call from patient requesting a call to discuss recent lab results. Patient requesting to know hemoglobin levels. Please advise, thank you.

## 2022-10-15 NOTE — Telephone Encounter (Signed)
Refer to alternate phone note 10/14/22.

## 2022-10-15 NOTE — Telephone Encounter (Signed)
Spoke with patient regarding results & recommendations. Discussed ED precautions again with patient. She verbalized all understanding.

## 2022-10-18 ENCOUNTER — Encounter: Payer: Self-pay | Admitting: Nurse Practitioner

## 2022-10-18 ENCOUNTER — Ambulatory Visit (INDEPENDENT_AMBULATORY_CARE_PROVIDER_SITE_OTHER): Payer: Medicare PPO | Admitting: Nurse Practitioner

## 2022-10-18 VITALS — BP 100/60 | HR 101 | Ht 62.0 in | Wt 193.5 lb

## 2022-10-18 DIAGNOSIS — D509 Iron deficiency anemia, unspecified: Secondary | ICD-10-CM

## 2022-10-18 DIAGNOSIS — R110 Nausea: Secondary | ICD-10-CM | POA: Diagnosis not present

## 2022-10-18 NOTE — Patient Instructions (Signed)
You have been scheduled for an abdominal ultrasound at [facility] (1st floor of hospital) on 10/22/22 at 10:00 am. Please arrive 15 minutes prior to your appointment for registration. Make certain not to have anything to eat or drink 6 hours prior to your appointment. Should you need to reschedule your appointment, please contact radiology at [phone]. This test typically takes about 30 minutes to perform.   Await stool card results.  Follow up with Dr.John Leonides Schanz in 2 weeks for follow up labs as scheduled.  Go to the ED if you develop black stools, chest pain, dizziness, shortness of breath or profound fatigue.  Due to recent changes in healthcare laws, you may see the results of your imaging and laboratory studies on MyChart before your provider has had a chance to review them.  We understand that in some cases there may be results that are confusing or concerning to you. Not all laboratory results come back in the same time frame and the provider may be waiting for multiple results in order to interpret others.  Please give [place] 48 hours in order for your provider to thoroughly review all the results before contacting the office for clarification of your results.   Thank you for trusting me with your gastrointestinal care!   Alcide Evener, CRNP

## 2022-10-18 NOTE — Progress Notes (Signed)
10/18/2022 Dana Nielsen [id number] Feb 05, 1945   Chief Complaint: Dark stools  History of Present Illness: Dana Nielsen is a 77 year old female with a past medical history of diabetes mellitus type II, hyperlipidemia, hypothyroidism, 5cm hiatal hernia, gastric ulcers, UGI bleed secondary to a dieulafoy lesion and IDA. She is a Scientist, product/process development. She is well known by Dr. Barron Alvine. She contacted our office on 10/14/2022 due to concerns regarding dark stools x 2 days with prior history of GI bleed. Laboratory studies 10/3 showed a hemoglobin level of 12.4 which is close to her baseline. BUN 25 up from 16. Iron 65.  TIBC 407.  Ferritin 171.8. Saturation ratio 16. Stool cards x 3 were ordered and were submitted to the lab at this time, results pending. She was instructed to go to the emergency room if she develops chest pain, shortness of breath, dizziness, profound fatigue or black stools. She presents today for further GI follow up. She denies passing any black stools. No rectal bleeding. Over the past 3 days, her stools are lighter brown in color. Earlier today, she passed a golden loose stool. She has intermittent loose stools which she attributes to taking magnesium due to prior history of low magnesium levels. She denies having any abdominal pain but endorses having abdominal bloat. She has nausea daily for the past month without vomiting. Her nausea sometimes improves when she eats and other times worsens after she eats. She drinks cold water which reduces her nausea. She has intermittent food aversion. No significant weight loss. She takes oral iron Floradix supplement and IV iron  PRN per hematologist Dr. Leonides Schanz. She is scheduled for a repeat CBC and iron panel with Dr. Leonides Schanz in 2 weeks. She has low energy, had difficulty getting out of bed today. One of her BP medications was changed last week. BP today 100/60 with heart rate 101. No GERD symptoms on Omeprazole 40 mg daily.   As  previously reviewed, she has a history of UGI bleed secondary to a dieulafoy lesion in the distal duodenum treated with APC and clips placed per small bowel enteroscopy  03/08/2019 and 09/2021. A nonbleeding gastric ulcer also noted per EGD 02/2019. She underwent a small bowel capsule endoscopy 06/02/2022 which showed a faint red area near the tattoo site (from the prior dieulafoy lesion endoscopic intervention/clip placement) possibly representing a small nonbleeding AVM or local inflammation.  No active bleeding or stigmata of bleeding was noted therefore her EGD was not recommended at that time.   She endorsed [place] 2 [place], results are unclear but she believes a few polyps were removed. Paternal aunt with history of colon cancer.      Latest Ref Rng & Units 10/14/2022    5:03 PM 08/02/2022    3:01 PM 05/03/2022    1:12 PM  CBC  WBC 4.0 - 10.5 K/uL 8.7  6.6  6.0   Hemoglobin 12.0 - 15.0 g/dL 21.3  08.6  57.8   Hematocrit 36.0 - 46.0 % 38.1  39.8  35.7   Platelets 150.0 - 400.0 K/uL 244.0  235  245        Latest Ref Rng & Units 10/14/2022    5:03 PM 08/02/2022    3:01 PM 05/03/2022    1:12 PM  CMP  Glucose 70 - 99 mg/dL 469  629  99   BUN 6 - 23 mg/dL 25  16  14    Creatinine 0.40 - 1.20  mg/dL 1.61  0.96  0.45   Sodium 135 - 145 mEq/L 141  141  143   Potassium 3.5 - 5.1 mEq/L 4.3  4.1  4.3   Chloride 96 - 112 mEq/L 100  105  107   CO2 19 - 32 mEq/L 28  28  29    Calcium 8.4 - 10.5 mg/dL 40.9  81.1  9.8   Total Protein 6.0 - 8.3 g/dL 7.4  7.2  7.5   Total Bilirubin 0.2 - 1.2 mg/dL 0.4  0.5  0.3   Alkaline Phos 39 - 117 U/L 51  52  53   AST 0 - 37 U/L 24  18  13    ALT 0 - 35 U/L 25  19  12      PAST GI PROCEDURES:  Small bowel capsule endoscopy 06/02/2022:  1) Complete capsule study with adequate prep 2) Tattoo noted in duodenum, approximately 2 minutes beyond pylorus 3) Possible faint red area near the tattoo site.  Possibly a small,  nonbleeding AVM or local inflammation from previous endoscopic intervention 4) the remainder of the small bowel was normal-appearing.  No active bleeding or stigmata of bleeding on the remainder of the study 5) first duodenal image at 12 minutes 50 seconds.  First cecal image at 6 hours 45 minutes   Summary Possible small, nonbleeding lesion in the proximal small bowel located near the previously placed tattoo.  Unclear if this is a nonbleeding AVM or possibly just some local erythema from previous instrumentation/intervention.  Either way, no active bleeding to require repeat endoscopy at this juncture.    Small bowel enteroscopy 09/19/2021: - Esophagogastric landmarks identified.  - 5 cm hiatal hernia.  - Normal esophagus - A few gastric polyps.  - Normal stomach otherwise  - Blood in the third portion of the duodenum and in the fourth portion of the duodenum.  - A tattoo was seen in the 3rd portion of the duodenum. The tattoo site appeared normal. - A dieulafoy lesion with bleeding in the distal duodenum, a few cms distal to prior tattoo. Treated with argon plasma coagulation (APC). Clips were placed. - The examined portion of the jejunum was normal. Distal aspect reached was tattooed. Bleeding dieulafoy lesion is the cause of patient's recent bleeding, treated endoscopically with good result   EGD 05/15/2021: - Normal esophagus.  - Z-line regular, 36 cm from the incisors.  - Multiple gastric polyps. Resected and retrieved.  - Normal mucosa was found in the entire stomach.  - Normal examined duodenum.   Small bowel enteroscopy 03/08/2019: - 2 cm hiatal hernia.  - Normal upper third of esophagus, middle third of esophagus and lower third of esophagus.  - Non-bleeding gastric ulcer with no stigmata of bleeding. Clips (MR conditional) were placed.  - Gastritis. Biopsied.  - A medium amount of food (residue) in the stomach.  - Active bleeding lesion in the third portion of the duodenum.  Appearance most consistent with duodenal Dieulafoy, which was successfully treated with hemostatic clip x1. The area 1-2 cm distal to the lesion was then tattooed for future identification in the event of rebleed and given elusive behavior of Dieulafoy lesions.  - Otherwise normal remainder of the small bowel including normal appearing papilla well proximal to the bleeding lesion  Current Outpatient Medications on File Prior to Visit  Medication Sig Dispense Refill   AMBULATORY NON FORMULARY MEDICATION Take 2 capsules by mouth daily. Medication Name: FLORADIX (iron supplement)     glucose blood (ACCU-CHEK  AVIVA PLUS) test strip Use to test your blood sugar In Vitro twice a day     losartan (COZAAR) 25 MG tablet Take 25 mg by mouth in the morning.     losartan-hydrochlorothiazide (HYZAAR) 50-12.5 MG tablet Take 1 tablet by mouth daily.     Magnesium Chloride 64 MG TABS 4 tablets Orally-OTC Once a day     magnesium oxide (MAG-OX) 400 (240 Mg) MG tablet Take 1 tablet (400 mg total) by mouth daily. (Patient taking differently: Take 400 mg by mouth in the morning.) 30 tablet 1   metFORMIN (GLUCOPHAGE) 1000 MG tablet Take 1,000 mg by mouth 2 (two) times daily with a meal.     Multiple Vitamins-Minerals (HAIR/SKIN/NAILS) CAPS Take 1 capsule by mouth in the morning.     Multiple Vitamins-Minerals (ONE-A-DAY WOMENS 50+) TABS Take 1 tablet by mouth in the morning.     omeprazole (PRILOSEC) 40 MG capsule Take 40 mg by mouth in the morning.     simvastatin (ZOCOR) 40 MG tablet Take 1 tablet (40 mg total) by mouth daily at 6 PM. (Patient taking differently: Take 40 mg by mouth at bedtime.) 90 tablet 3   SYNTHROID 100 MCG tablet Take 100 mcg by mouth daily before breakfast.     traZODone (DESYREL) 100 MG tablet TAKE 1 TABLET BY MOUTH AT BEDTIME (Patient taking differently: Take 100 mg by mouth at bedtime.) 90 tablet 0   VICTOZA 18 MG/3ML SOPN Inject 1.8 mg into the skin in the morning.     Vitamin D,  Ergocalciferol, (DRISDOL) 1.25 MG (50000 UNIT) CAPS capsule Take 50,000 Units by mouth every 7 (seven) days.     No current facility-administered medications on file prior to visit.   Allergies  Allergen Reactions   Lidocaine Shortness Of Breath    Per pt had wrist injection w/ lidocaine,  stated sob w/ throat closing   Macrobid [Nitrofurantoin] Other (See Comments)    Headache Confusion  Dizziness Myalgias     Current Medications, Allergies, Past Medical History, Past Surgical History, Family History and Social History were reviewed in [employer] record.  Review of Systems:   Constitutional: Negative for fever, sweats, chills or weight loss.  Respiratory: Negative for shortness of breath.   Cardiovascular: Negative for chest pain, palpitations and leg swelling.  Gastrointestinal: See HPI.  Musculoskeletal: Negative for back pain or muscle aches.  Neurological: Negative for dizziness, headaches or paresthesias.   Physical Exam: BP 100/60 (BP Location: Left Arm, Patient Position: Sitting, Cuff Size: Normal)   Pulse (!) 101   Ht 5\' 2"  (1.575 m)   Wt 193 lb 8 oz (87.8 kg)   BMI 35.39 kg/m   General: 77 year old female in no acute distress. Head: Normocephalic and atraumatic. Eyes: No scleral icterus. Conjunctiva pink . Ears: Normal auditory acuity. Mouth: Dentition intact. No ulcers or lesions.  Lungs: Clear throughout to auscultation. Heart: Regular rate and rhythm. Systolic murmur. Abdomen: Soft, nontender and nondistended. No masses or hepatomegaly. Normal bowel sounds x 4 quadrants.  Rectal: Deferred.  Musculoskeletal: Symmetrical with no gross deformities. Extremities: No edema. Neurological: Alert oriented x 4. No focal deficits.  Psychological: Alert and cooperative. Normal mood and affect  Assessment and Recommendations:  77 year old female (Jehovah's Witness) with a history of IDA and upper GI bleed secondary to a dieulafoy lesion in the  distal duodenum treated with APC and clips placed per small bowel enteroscopy 2//2021 and 09/2021. A nonbleeding gastric ulcer also noted per EGD 02/2019.  She developed dark brown, not black stools x 2 days last week. Labs 10/3 showed a hemoglobin level of 12.4 which is her baseline level. BUN 25 up from 16. Iron 65.  TIBC 407.  Ferritin 171.8.  Saturation ratio 16.  Stool cards x 3 were ordered and were submitted to the lab at this time, results pending. No further dark stools for the past 3 days. Today, passed a golden colored loose stool.  -Repeat CBC and iron panel with Dr. Leonides Schanz in 2 weeks as scheduled -Patient will contact our office if dark stools or black stools recur, if occurs will recheck CBC and BMP -Await stool card results  -Patient instructed to go to the emergency room if she develops chest pain, shortness of breath, dizziness, profound fatigue or black stools -Consider small bowel enteroscopy +/- colonoscopy if the globin level drops or melena recurs, to discuss further with Dr. Barron Alvine  Nausea without vomiting. Normal LFTs.  History of a 9 mm gallbladder polyp per abdominal sonogram 2021. -RUQ sonogram to evaluate the gallbladder -Patient declined antiemetic -ginger tea, ginger ale as tolerated

## 2022-10-19 ENCOUNTER — Ambulatory Visit (INDEPENDENT_AMBULATORY_CARE_PROVIDER_SITE_OTHER): Payer: Medicare PPO

## 2022-10-19 DIAGNOSIS — R195 Other fecal abnormalities: Secondary | ICD-10-CM | POA: Diagnosis not present

## 2022-10-19 DIAGNOSIS — Z8719 Personal history of other diseases of the digestive system: Secondary | ICD-10-CM | POA: Diagnosis not present

## 2022-10-19 LAB — HEMOCCULT SLIDES (X 3 CARDS)
Fecal Occult Blood: NEGATIVE
OCCULT 1: NEGATIVE
OCCULT 2: NEGATIVE
OCCULT 3: NEGATIVE
OCCULT 4: NEGATIVE
OCCULT 5: NEGATIVE

## 2022-10-19 NOTE — Progress Notes (Signed)
Agree with the assessment and plan as outlined by Colleen Kennedy-Smith, NP.   Trask Vosler, DO, FACG [facility]   

## 2022-10-22 ENCOUNTER — Ambulatory Visit (HOSPITAL_COMMUNITY)
Admission: RE | Admit: 2022-10-22 | Discharge: 2022-10-22 | Disposition: A | Discharge status: Home / Self Care | Payer: Medicare PPO | Source: Ambulatory Visit | Attending: Nurse Practitioner | Admitting: Nurse Practitioner

## 2022-10-22 DIAGNOSIS — R110 Nausea: Secondary | ICD-10-CM | POA: Insufficient documentation

## 2022-10-22 DIAGNOSIS — D509 Iron deficiency anemia, unspecified: Secondary | ICD-10-CM | POA: Diagnosis present

## 2022-11-01 ENCOUNTER — Inpatient Hospital Stay: Payer: Medicare PPO | Attending: Hematology and Oncology

## 2022-11-01 ENCOUNTER — Other Ambulatory Visit: Payer: Self-pay | Admitting: Hematology and Oncology

## 2022-11-01 ENCOUNTER — Other Ambulatory Visit: Payer: Self-pay

## 2022-11-01 DIAGNOSIS — D500 Iron deficiency anemia secondary to blood loss (chronic): Secondary | ICD-10-CM

## 2022-11-01 LAB — CBC WITH DIFFERENTIAL (CANCER CENTER ONLY)
Abs Immature Granulocytes: 0.02 10*3/uL (ref 0.00–0.07)
Basophils Absolute: 0.1 10*3/uL (ref 0.0–0.1)
Basophils Relative: 1 %
Eosinophils Absolute: 0.2 10*3/uL (ref 0.0–0.5)
Eosinophils Relative: 3 %
HCT: 36.8 % (ref 36.0–46.0)
Hemoglobin: 11.8 g/dL — ABNORMAL LOW (ref 12.0–15.0)
Immature Granulocytes: 0 %
Lymphocytes Relative: 30 %
Lymphs Abs: 1.8 10*3/uL (ref 0.7–4.0)
MCH: 28.9 pg (ref 26.0–34.0)
MCHC: 32.1 g/dL (ref 30.0–36.0)
MCV: 90 fL (ref 80.0–100.0)
Monocytes Absolute: 0.5 10*3/uL (ref 0.1–1.0)
Monocytes Relative: 8 %
Neutro Abs: 3.5 10*3/uL (ref 1.7–7.7)
Neutrophils Relative %: 58 %
Platelet Count: 202 10*3/uL (ref 150–400)
RBC: 4.09 MIL/uL (ref 3.87–5.11)
RDW: 14.4 % (ref 11.5–15.5)
WBC Count: 6.1 10*3/uL (ref 4.0–10.5)
nRBC: 0 % (ref 0.0–0.2)

## 2022-11-01 LAB — CMP (CANCER CENTER ONLY)
ALT: 22 U/L (ref 0–44)
AST: 19 U/L (ref 15–41)
Albumin: 4.3 g/dL (ref 3.5–5.0)
Alkaline Phosphatase: 44 U/L (ref 38–126)
Anion gap: 8 (ref 5–15)
BUN: 22 mg/dL (ref 8–23)
CO2: 28 mmol/L (ref 22–32)
Calcium: 10.1 mg/dL (ref 8.9–10.3)
Chloride: 106 mmol/L (ref 98–111)
Creatinine: 0.83 mg/dL (ref 0.44–1.00)
GFR, Estimated: >60 mL/min (ref >60)
Glucose, Bld: 138 mg/dL — ABNORMAL HIGH (ref 70–99)
Potassium: 4.4 mmol/L (ref 3.5–5.1)
Sodium: 142 mmol/L (ref 135–145)
Total Bilirubin: 0.4 mg/dL (ref 0.3–1.2)
Total Protein: 7.2 g/dL (ref 6.5–8.1)

## 2022-11-01 LAB — IRON AND IRON BINDING CAPACITY (CC-WL,HP ONLY)
Iron: 75 ug/dL (ref 28–170)
Saturation Ratios: 20 % (ref 10.4–31.8)
TIBC: 375 ug/dL (ref 250–450)
UIBC: 300 ug/dL (ref 148–442)

## 2022-11-01 LAB — RETIC PANEL
Immature Retic Fract: 12.7 % (ref 2.3–15.9)
RBC.: 4.04 MIL/uL (ref 3.87–5.11)
Retic Count, Absolute: 67.9 10*3/uL (ref 19.0–186.0)
Retic Ct Pct: 1.7 % (ref 0.4–3.1)
Reticulocyte Hemoglobin: 32.8 pg (ref >27.9)

## 2022-11-01 LAB — FERRITIN: Ferritin: 175 ng/mL (ref 11–307)

## 2022-11-02 NOTE — Telephone Encounter (Signed)
Spoke to pt.  Documented in result notes

## 2022-11-02 NOTE — Telephone Encounter (Signed)
Patient returning call. Please advise

## 2022-11-05 ENCOUNTER — Other Ambulatory Visit: Payer: Self-pay

## 2022-11-05 ENCOUNTER — Telehealth: Payer: Self-pay | Admitting: Nurse Practitioner

## 2022-11-05 DIAGNOSIS — D509 Iron deficiency anemia, unspecified: Secondary | ICD-10-CM

## 2022-11-05 NOTE — Telephone Encounter (Signed)
Inbound call from patient returning phone call regarding recent imaging results. Requesting a call back. Please advise, thank you.

## 2022-11-05 NOTE — Telephone Encounter (Signed)
Refer back to imaging result note.

## 2022-11-12 ENCOUNTER — Other Ambulatory Visit: Payer: Self-pay | Admitting: General Surgery

## 2022-11-12 ENCOUNTER — Other Ambulatory Visit (HOSPITAL_COMMUNITY): Payer: Self-pay | Admitting: General Surgery

## 2022-11-12 DIAGNOSIS — K824 Cholesterolosis of gallbladder: Secondary | ICD-10-CM

## 2022-11-12 DIAGNOSIS — Z0000 Encounter for general adult medical examination without abnormal findings: Secondary | ICD-10-CM

## 2022-11-19 ENCOUNTER — Encounter (HOSPITAL_COMMUNITY): Payer: Medicare PPO

## 2022-11-19 ENCOUNTER — Encounter (HOSPITAL_COMMUNITY)
Admission: RE | Admit: 2022-11-19 | Discharge: 2022-11-19 | Disposition: A | Discharge status: Home / Self Care | Payer: Medicare PPO | Source: Ambulatory Visit | Attending: General Surgery

## 2022-11-19 DIAGNOSIS — K824 Cholesterolosis of gallbladder: Secondary | ICD-10-CM | POA: Diagnosis present

## 2022-11-19 MED ORDER — TECHNETIUM TC 99M MEBROFENIN IV KIT
5.0000 | PACK | Freq: Once | INTRAVENOUS | Status: AC | PRN
  Administered 2022-11-19: 5 via INTRAVENOUS

## 2022-12-06 ENCOUNTER — Other Ambulatory Visit (INDEPENDENT_AMBULATORY_CARE_PROVIDER_SITE_OTHER): Payer: Medicare PPO

## 2022-12-06 DIAGNOSIS — D509 Iron deficiency anemia, unspecified: Secondary | ICD-10-CM | POA: Diagnosis not present

## 2022-12-06 LAB — CBC
HCT: 40.2 % (ref 36.0–46.0)
Hemoglobin: 13.2 g/dL (ref 12.0–15.0)
MCHC: 32.8 g/dL (ref 30.0–36.0)
MCV: 89.3 fL (ref 78.0–100.0)
Platelets: 227 10*3/uL (ref 150.0–400.0)
RBC: 4.5 Mil/uL (ref 3.87–5.11)
RDW: 14.3 % (ref 11.5–15.5)
WBC: 8 10*3/uL (ref 4.0–10.5)

## 2023-01-13 ENCOUNTER — Ambulatory Visit: Payer: Medicare HMO | Admitting: Gastroenterology

## 2023-01-21 ENCOUNTER — Encounter: Payer: Self-pay | Admitting: Physician Assistant

## 2023-01-28 ENCOUNTER — Ambulatory Visit (INDEPENDENT_AMBULATORY_CARE_PROVIDER_SITE_OTHER): Payer: No Typology Code available for payment source | Admitting: Gastroenterology

## 2023-01-28 ENCOUNTER — Encounter: Payer: Self-pay | Admitting: Physician Assistant

## 2023-01-28 ENCOUNTER — Encounter: Payer: Self-pay | Admitting: Gastroenterology

## 2023-01-28 VITALS — BP 118/60 | HR 100 | Ht 62.0 in | Wt 196.0 lb

## 2023-01-28 DIAGNOSIS — Z8719 Personal history of other diseases of the digestive system: Secondary | ICD-10-CM

## 2023-01-28 DIAGNOSIS — K3182 Dieulafoy lesion (hemorrhagic) of stomach and duodenum: Secondary | ICD-10-CM

## 2023-01-28 DIAGNOSIS — K760 Fatty (change of) liver, not elsewhere classified: Secondary | ICD-10-CM | POA: Diagnosis not present

## 2023-01-28 DIAGNOSIS — D509 Iron deficiency anemia, unspecified: Secondary | ICD-10-CM | POA: Diagnosis not present

## 2023-01-28 DIAGNOSIS — K259 Gastric ulcer, unspecified as acute or chronic, without hemorrhage or perforation: Secondary | ICD-10-CM

## 2023-01-28 DIAGNOSIS — Z8711 Personal history of peptic ulcer disease: Secondary | ICD-10-CM | POA: Diagnosis not present

## 2023-01-28 NOTE — Patient Instructions (Signed)
_______________________________________________________  If your blood pressure at your visit was 140/90 or greater, please contact your primary care physician to follow up on this.  _______________________________________________________  If you are age 78 or older, your body mass index should be between 23-30. Your Body mass index is 35.85 kg/m. If this is out of the aforementioned range listed, please consider follow up with your Primary Care Provider.  If you are age 39 or younger, your body mass index should be between 19-25. Your Body mass index is 35.85 kg/m. If this is out of the aformentioned range listed, please consider follow up with your Primary Care Provider.   ________________________________________________________  The [facility] providers would like to encourage you to use [facility] to communicate with providers for non-urgent requests or questions.  Due to long hold times on the telephone, sending your provider a message by [facility] may be a faster and more efficient way to get a response.  Please allow 48 business hours for a response.  Please remember that this is for non-urgent requests.  _______________________________________________________  Bonita Quin will follow up in our office in 1 year.  We will contact you to schedule this appointment.   It was a pleasure to see you today!  Vito Cirigliano, D.O.

## 2023-01-28 NOTE — Progress Notes (Signed)
Chief Complaint:    Hepatic steatosis  GI History: 78 year old female Jehovah's Witness with history of diabetes, hyperlipidemia, hypothyroidism, IDA, hiatal hernia, gastric ulcer.  History of obscure GI bleed and IDA with prior extensive evaluation in [place], [place]. Reports having a negative/normal EGD, colonoscopy, VCE (no records for review).  Hospital admission 02/2019 with upper GI bleed 2/2 actively bleeding duodenal Dieulafoy lesion.   - 03/08/2019: Inpatient push enteroscopy: 2 cm HH, 10 mm cratered gastric antral ulcer (clips x2), moderate gastritis with erosions (biopsies negative for H. pylori), residual food in stomach, active oozing Dieulafoy lesion in third portion of duodenum (clips x1; tattoo placed 1-2 cm distal to the lesion for future identification).  Was treated with IV iron infusion, high-dose PPI, sucralfate and discharged the following day. - 05/15/2021: EGD: Normal esophagus, gastric polyps, otherwise normal stomach/duodenum - 09/19/2021: Small bowel enteroscopy: 5 cm HH, benign gastric polyps, blood in the third/fourth portion of the duodenum. - 06/02/2022: VCE: faint red area near the tattoo site (from the prior dieulafoy lesion endoscopic intervention/clip placement) possibly representing a small nonbleeding AVM or local inflammation. No active bleeding or stigmata of bleeding was noted. tattoo was seen in the 3rd portion of the duodenum. The tattoo site appeared normal. - A dieulafoy lesion with bleeding in the distal duodenum, a few cms distal to prior tattoo. Treated with argon plasma coagulation (APC). Clips were placed.  Tattoo placed in the distal aspect reached in the jejunum   HPI:     Patient is a 78 y.o. female presenting to the Gastroenterology Clinic for routine follow-up.  Was last seen by Alcide Evener on 10/18/2022.  Main issue at that time was dark stools, but stable H/H and iron panel.  Dark stools had resolved by the time of her GI appointment.   FOBT negative x 3.  Follows with Dr. Leonides Schanz in the Hematology clinic.  Currently on oral iron and will receive IV iron infusions prn.  Has follow-up with Dr. Leonides Schanz next week.  No active issues today.  Reviewed labs from 10/2022: - Normal CMP - H/H 11.8/36.8 (stable from previous) with normal MCV/RDW - Ferritin 175, iron 75, TIBC 375, sat 20%  Repeat labs last week at [facility]: - H/H 12.7/37.6 - A1c 6.4%   -10/22/2022: RUQ [place]: 9 mm GB polyp similar to prior without GB wall thickening.  Normal CBD.  Increased hepatic echogenicity suggestive of steatosis - 11/19/2022: HIDA: Normal  Review of systems:     No chest pain, no SOB, no fevers, no urinary sx   Past Medical History:  Diagnosis Date   Endometrial mass    Family history of colon cancer 12/09/2020   Family history of lung cancer 12/09/2020   Family history of multiple myeloma 12/09/2020   Full dentures    GERD (gastroesophageal reflux disease)    H/O: upper GI bleed    [facility] time 09-19-2021  s/p EGD with bleeding Dielufoy lesion of distal duodedum tx'd APC and clips;  previous gi bleed 03-08-2019  s/p EGD w/ hemastatis and clips   History of gastric ulcer    w/ upper GI bleed 03-08-2019 gastric ulcer nonbleeding and duodunal bleeding lesion   HLD (hyperlipidemia)    Hypothyroidism    followed by pcp   Iron deficiency anemia    hematologist--- dr j. Leonides Schanz;   due to hx GI bleed and blood product refusal   MDD (major depressive disorder)    OA (osteoarthritis)    hands, knees   Ovarian  cyst, right    Refusal of blood transfusions as patient is Jehovah's Witness    Type 2 diabetes mellitus (HCC)    followed by pcp  (12-29-2021  per pt check blood sugar daily in am fasting, average 105--110)   Wears hearing aid in both ears     Patient's surgical history, family medical history, social history, medications and allergies were all reviewed in Epic    Current Outpatient Medications  Medication Sig Dispense Refill    AMBULATORY NON FORMULARY MEDICATION Take 2 capsules by mouth daily. Medication Name: FLORADIX (iron supplement)     finasteride (PROPECIA) 1 MG tablet Take by mouth.     glucose blood (ACCU-CHEK AVIVA PLUS) test strip Use to test your blood sugar In Vitro twice a day     losartan-hydrochlorothiazide (HYZAAR) 50-12.5 MG tablet Take 1 tablet by mouth daily.     Magnesium Chloride 64 MG TABS 4 tablets Orally-OTC Once a day     magnesium oxide (MAG-OX) 400 (240 Mg) MG tablet Take 1 tablet (400 mg total) by mouth daily. (Patient taking differently: Take 400 mg by mouth in the morning.) 30 tablet 1   metFORMIN (GLUCOPHAGE) 1000 MG tablet Take 1,000 mg by mouth 2 (two) times daily with a meal.     Multiple Vitamins-Minerals (HAIR/SKIN/NAILS) CAPS Take 1 capsule by mouth in the morning.     Multiple Vitamins-Minerals (ONE-A-DAY WOMENS 50+) TABS Take 1 tablet by mouth in the morning.     omeprazole (PRILOSEC) 40 MG capsule Take 40 mg by mouth in the morning.     OVER THE COUNTER MEDICATION Pt taking oral Monoxidil     simvastatin (ZOCOR) 40 MG tablet Take 1 tablet (40 mg total) by mouth daily at 6 PM. (Patient taking differently: Take 40 mg by mouth at bedtime.) 90 tablet 3   SYNTHROID 100 MCG tablet Take 100 mcg by mouth daily before breakfast.     traZODone (DESYREL) 100 MG tablet TAKE 1 TABLET BY MOUTH AT BEDTIME (Patient taking differently: Take 100 mg by mouth at bedtime.) 90 tablet 0   VICTOZA 18 MG/3ML SOPN Inject 1.8 mg into the skin in the morning.     Vitamin D, Ergocalciferol, (DRISDOL) 1.25 MG (50000 UNIT) CAPS capsule Take 50,000 Units by mouth every 7 (seven) days.     losartan (COZAAR) 25 MG tablet Take 25 mg by mouth in the morning.     No current facility-administered medications for this visit.    Physical Exam:     BP 118/60   Pulse 100   Ht 5\' 2"  (1.575 m)   Wt 196 lb (88.9 kg)   BMI 35.85 kg/m   GENERAL:  Pleasant female in NAD PSYCH: : Cooperative, normal  affect Musculoskeletal:  Normal muscle tone, normal strength NEURO: Alert and oriented x 3, no focal neurologic deficits   IMPRESSION and PLAN:    1) Iron deficiency anemia 2) History of duodenal Dieulafoy bleed 3) History of gastric ulcer H/H continues to improve, now 12.7/37.6 last week.  No further bleeding. - Continue Prilosec for gastric prophylaxis - Keep follow-up appointment with Dr. Leonides Schanz next week in the Hematology Clinic - Continue oral iron with IV iron as needed - Patient knows to contact our office if concern for rebleeding or ER if after hours  4) Hepatic steatosis Ultrasound in 10/2022 with increased hepatic echogenicity.  Otherwise normal liver enzymes and no radiographic or serologic evidence of impaired hepatic synthetic function.  Discussed diagnosis today.  No  further evaluation needed at this time. - Continue treating underlying comorbidities as currently doing       RTC in 1 year or sooner as needed       Shellia Cleverly ,DO, FACG 01/28/2023, 9:51 AM

## 2023-01-30 ENCOUNTER — Other Ambulatory Visit (HOSPITAL_BASED_OUTPATIENT_CLINIC_OR_DEPARTMENT_OTHER): Payer: Self-pay | Admitting: Hematology and Oncology

## 2023-01-30 DIAGNOSIS — D500 Iron deficiency anemia secondary to blood loss (chronic): Secondary | ICD-10-CM

## 2023-01-30 NOTE — Progress Notes (Unsigned)
[facility] [phone]   [phone]  PROGRESS NOTE  Patient Care Team: Tracey Harries, MD as PCP - General (Family Medicine)  Hematological/Oncological History #Iron Deficiency Anemia 2/2 to GI Bleeding 1) 2019: presented to [facility] in [facility] with symptoms of anemia. Found to have Hgb 5.0. EGD/colon/capsule reported found no source of bleed 2) 01/18/2019: establish care with Dr. Leonides Schanz  3) 03/08/2019: admitted for acute GI bleed. Endoscopy revealed an actively bleeding Dieulafoy lesion that was clipped. Received IV feraheme 510mg  x 1 dose during admission 4) 03/15/2019: received dose 2 of IV feraheme 510mg  in outpatient setting. 5) 04/13/2019: WBC 8.6, Hgb 12.2, MCV 92.1, Plt 216 6) 07/19/2019: WBC 7.1, Hgb 12.2, MCV 90.6, Plt 226 7) 09/20/2021-09/21/2021: IV Ferrleciti 250 mg x 2 doses 8) 10/15/2021-1012/2023: IV venofer 200 mg x 2 doses 9) 08/02/2022: WBC 6.6, Hgb 12.8, MCV 86.3, Plt 235  Interval History:  Dana Nielsen 78 y.o. female with medical history significant for Dieulafoy lesion bleeding with iron deficiency anemia who presents for a follow up visit. The patient's last visit was on 08/02/2022. In the interim since the last visit she reports she has had no major changes in her health.  On exam today Dana Nielsen reports she had all her teeth removed for dentures but is not currently wearing her dentures.  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  She reports that her energy levels are good after some recent adjustments to her thyroid medication.  She reports her thyroid has been "up-and-down".  She notes that she recently started semaglutide therapy as prescribed by her primary care provider.  She notes that she has not noticed any bleeding, bruising, or dark stools.  She reports that she has not seen any frank signs of blood since 2023.  She notes that she does her best to try to  eat iron rich foods and is also taking Floranex.  Overall she feels well with no questions concerns or complaints.  She is otherwise at her baseline level of health.  Patient denies fevers, chills, night sweats, shortness of breath, chest pain or cough.  She has no other complaints. A full 10 point ROS is listed below.  MEDICAL HISTORY:  Past Medical History:  Diagnosis Date   Endometrial mass    Family history of colon cancer 12/09/2020   Family history of lung cancer 12/09/2020   Family history of multiple myeloma 12/09/2020   Full dentures    GERD (gastroesophageal reflux disease)    H/O: upper GI bleed    las time 09-19-2021  s/p EGD with bleeding Dielufoy lesion of distal duodedum tx'd APC and clips;  previous gi bleed 03-08-2019  s/p EGD w/ hemastatis and clips   History of gastric ulcer    w/ upper GI bleed 03-08-2019 gastric ulcer nonbleeding and duodunal bleeding lesion   HLD (hyperlipidemia)    Hypothyroidism    followed by pcp   Iron deficiency anemia    hematologist--- dr Shela Commons. Leonides Schanz;   due to hx GI bleed and blood product refusal   MDD (major depressive disorder)    OA (osteoarthritis)    hands, knees   Ovarian cyst, right    Refusal of blood transfusions as patient is Jehovah's Witness    Type 2 diabetes mellitus (HCC)    followed by pcp  (12-29-2021  per pt check blood sugar daily in am fasting, average 105--110)  Wears hearing aid in both ears     SURGICAL HISTORY: Past Surgical History:  Procedure Laterality Date   BIOPSY  03/08/2019   Procedure: BIOPSY;  Surgeon: Shellia Cleverly, DO;  Location: [facility];  Service: Gastroenterology;;   CATARACT EXTRACTION Left 05/2022   [place]ON[place]COPY  [place]2 [place] in [place]   DILATATION & CURETTAGE/HYSTEROSCOPY WITH MYOSURE N/A 12/30/2021   Procedure: DILATATION & CURETTAGE/HYSTEROSCOPY WITH MYOSURE;  Surgeon: Steva Ready, DO;  Location: [facility];  Service: Gynecology;   Laterality: N/A;   DILATION AND CURETTAGE OF UTERUS     yrs ago   ENTEROSCOPY N/A 03/08/2019   Procedure: ENTEROSCOPY;  Surgeon: Shellia Cleverly, DO;  Location: [facility];  Service: Gastroenterology;  Laterality: N/A;   ENTEROSCOPY N/A 09/19/2021   Procedure: ENTEROSCOPY;  Surgeon: Benancio Deeds, MD;  Location: [facility];  Service: Gastroenterology;  Laterality: N/A;   HEMOSTASIS CLIP PLACEMENT  03/08/2019   Procedure: HEMOSTASIS CLIP PLACEMENT;  Surgeon: Shellia Cleverly, DO;  Location: [facility];  Service: Gastroenterology;;   HEMOSTASIS CLIP PLACEMENT  09/19/2021   Procedure: HEMOSTASIS CLIP PLACEMENT;  Surgeon: Benancio Deeds, MD;  Location: [facility];  Service: Gastroenterology;;   HOT HEMOSTASIS N/A 09/19/2021   Procedure: HOT HEMOSTASIS (ARGON PLASMA COAGULATION/BICAP);  Surgeon: Benancio Deeds, MD;  Location: [facility];  Service: Gastroenterology;  Laterality: N/A;   KNEE ARTHROSCOPY Left 2003   LAPAROSCOPIC BILATERAL SALPINGO OOPHERECTOMY Bilateral 12/30/2021   Procedure: LAPAROSCOPIC BILATERAL SALPINGO OOPHORECTOMY;  Surgeon: Steva Ready, DO;  Location: [facility];  Service: Gynecology;  Laterality: Bilateral;   SUBMUCOSAL TATTOO INJECTION  03/08/2019   Procedure: SUBMUCOSAL TATTOO INJECTION;  Surgeon: Shellia Cleverly, DO;  Location: [facility];  Service: Gastroenterology;;   SUBMUCOSAL TATTOO INJECTION  09/19/2021   Procedure: SUBMUCOSAL TATTOO INJECTION;  Surgeon: Benancio Deeds, MD;  Location: [facility];  Service: Gastroenterology;;   TONSILLECTOMY     child   TUBAL LIGATION Bilateral 1990   WISDOM TOOTH EXTRACTION     WRIST FRACTURE SURGERY Right 2013   per pt for crush injury,  no hardware    SOCIAL HISTORY: Social History   Socioeconomic History   Marital status: Widowed    Spouse name: Not on file   Number of children: 9   Years of education: Not on file   Highest education level: Not on file   Occupational History   Not on file  Tobacco Use   Smoking status: Never   Smokeless tobacco: Never  Vaping Use   Vaping status: Never Used  Substance and Sexual Activity   Alcohol use: Not Currently   Drug use: Never   Sexual activity: Not on file  Other Topics Concern   Not on file  Social History Narrative   Not on file   Social Drivers of Health   Financial Resource Strain: Low Risk  (01/26/2023)   Received from [facility]   Overall Financial Resource Strain (CARDIA)    Difficulty of Paying Living Expenses: Not hard at all  Food Insecurity: No Food Insecurity (01/26/2023)   Received from [facility]   Hunger Vital Sign    Worried About Running Out of Food in the Last Year: Never true    Ran Out of Food in the Last Year: Never true  Transportation Needs: No Transportation Needs (01/26/2023)   Received from [facility] - Transportation    Lack of Transportation (Medical): No  Lack of Transportation (Non-Medical): No  Physical Activity: Unknown (07/20/2022)   Received from [facility]   Exercise Vital Sign    Days of Exercise per Week: 0 days    Minutes of Exercise per Session: Not on file  Stress: Stress Concern Present (07/20/2022)   Received from [facility] - Occupational Stress Questionnaire    Feeling of Stress : Very much  Social Connections: Socially Integrated (07/20/2022)   Received from [facility]   Social Network    How would you rate your social network (family, work, friends)?: Good participation with social networks  Intimate Partner Violence: Not At Risk (07/20/2022)   Received from [facility]   HITS    Over the last 12 months how often did your partner physically hurt you?: Never    Over the last 12 months how often did your partner insult you or talk down to you?: Never    Over the last 12 months how often did your partner threaten you with physical harm?: Never    Over the last 12  months how often did your partner scream or curse at you?: Never    FAMILY HISTORY: Family History  Problem Relation Age of Onset   Multiple myeloma Mother 32   Heart attack Father    Lung cancer Sister 59       smoking hx   Leukemia Paternal Aunt        dx before 79   Cancer Paternal Aunt        unknown type; dx after 84   Colon cancer Paternal Aunt        dx after 50   Head & neck cancer Paternal Uncle        dx before 56   Cancer Paternal Uncle        unknown type; dx after 50   Cancer Paternal Uncle        color or gastric; dx after 50    ALLERGIES:  is allergic to lidocaine and macrobid [nitrofurantoin].  MEDICATIONS:  Current Outpatient Medications  Medication Sig Dispense Refill   AMBULATORY NON FORMULARY MEDICATION Take 2 capsules by mouth daily. Medication Name: FLORADIX (iron supplement)     finasteride (PROPECIA) 1 MG tablet Take by mouth.     glucose blood (ACCU-CHEK AVIVA PLUS) test strip Use to test your blood sugar In Vitro twice a day     losartan (COZAAR) 25 MG tablet Take 25 mg by mouth in the morning.     losartan-hydrochlorothiazide (HYZAAR) 50-12.5 MG tablet Take 1 tablet by mouth daily.     Magnesium Chloride 64 MG TABS 4 tablets Orally-OTC Once a day     magnesium oxide (MAG-OX) 400 (240 Mg) MG tablet Take 1 tablet (400 mg total) by mouth daily. (Patient taking differently: Take 400 mg by mouth in the morning.) 30 tablet 1   metFORMIN (GLUCOPHAGE) 1000 MG tablet Take 1,000 mg by mouth 2 (two) times daily with a meal.     Multiple Vitamins-Minerals (HAIR/SKIN/NAILS) CAPS Take 1 capsule by mouth in the morning.     Multiple Vitamins-Minerals (ONE-A-DAY WOMENS 50+) TABS Take 1 tablet by mouth in the morning.     omeprazole (PRILOSEC) 40 MG capsule Take 40 mg by mouth in the morning.     OVER THE COUNTER MEDICATION Pt taking oral Monoxidil     simvastatin (ZOCOR) 40 MG tablet Take 1 tablet (40 mg total) by mouth daily at 6  PM. (Patient taking differently:  Take 40 mg by mouth at bedtime.) 90 tablet 3   SYNTHROID 100 MCG tablet Take 100 mcg by mouth daily before breakfast.     traZODone (DESYREL) 100 MG tablet TAKE 1 TABLET BY MOUTH AT BEDTIME (Patient taking differently: Take 100 mg by mouth at bedtime.) 90 tablet 0   VICTOZA 18 MG/3ML SOPN Inject 1.8 mg into the skin in the morning.     Vitamin D, Ergocalciferol, (DRISDOL) 1.25 MG (50000 UNIT) CAPS capsule Take 50,000 Units by mouth every 7 (seven) days.     No current facility-administered medications for this visit.    REVIEW OF SYSTEMS:   Constitutional: ( - ) fevers, ( - )  chills , ( - ) night sweats Eyes: ( - ) blurriness of vision, ( - ) double vision, ( - ) watery eyes Ears, nose, mouth, throat, and face: ( - ) mucositis, ( - ) sore throat Respiratory: ( - ) cough, ( - ) dyspnea, ( - ) wheezes Cardiovascular: ( - ) palpitation, ( - ) chest discomfort, ( - ) lower extremity swelling Gastrointestinal:  ( - ) nausea, ( - ) heartburn, ( - ) change in bowel habits Skin: ( - ) abnormal skin rashes Lymphatics: ( - ) new lymphadenopathy, ( - ) easy bruising Neurological: ( - ) numbness, ( - ) tingling, ( - ) new weaknesses Behavioral/Psych: ( - ) mood change, ( - ) new changes  All other systems were reviewed with the patient and are negative.  PHYSICAL EXAMINATION: ECOG PERFORMANCE STATUS: 1 - Symptomatic but completely ambulatory  There were no vitals filed for this visit.    There were no vitals filed for this visit.     GENERAL:well appearing elderly Caucasian female alert, no distress and comfortable SKIN: skin color, texture, turgor are normal, no rashes or significant lesions EYES: conjunctiva are pink and non-injected, sclera clear LUNGS: clear to auscultation and percussion with normal breathing effort HEART: regular rate & rhythm and no murmurs and no lower extremity edema Musculoskeletal: no cyanosis of digits and no clubbing  PSYCH: alert & oriented x 3, fluent  speech NEURO: no focal motor/sensory deficits  LABORATORY DATA:  I have reviewed the data as listed    Latest Ref Rng & Units 12/06/2022    2:24 PM 11/01/2022    2:13 PM 10/14/2022    5:03 PM  CBC  WBC 4.0 - 10.5 K/uL 8.0  6.1  8.7   Hemoglobin 12.0 - 15.0 g/dL 60.4  54.0  98.1   Hematocrit 36.0 - 46.0 % 40.2  36.8  38.1   Platelets 150.0 - 400.0 K/uL 227.0  202  244.0        Latest Ref Rng & Units 11/01/2022    2:13 PM 10/14/2022    5:03 PM 08/02/2022    3:01 PM  CMP  Glucose 70 - 99 mg/dL 191  478  295   BUN 8 - 23 mg/dL 22  25  16    Creatinine 0.44 - 1.00 mg/dL 6.21  3.08  6.57   Sodium 135 - 145 mmol/L 142  141  141   Potassium 3.5 - 5.1 mmol/L 4.4  4.3  4.1   Chloride 98 - 111 mmol/L 106  100  105   CO2 22 - 32 mmol/L 28  28  28    Calcium 8.9 - 10.3 mg/dL 84.6  96.2  95.2   Total Protein 6.5 - 8.1 g/dL 7.2  7.4  7.2   Total Bilirubin 0.3 - 1.2 mg/dL 0.4  0.4  0.5   Alkaline Phos 38 - 126 U/L 44  51  52   AST 15 - 41 U/L 19  24  18    ALT 0 - 44 U/L 22  25  19        RADIOGRAPHIC STUDIES: No results found.  ASSESSMENT & PLAN Dana Nielsen 78 y.o. female with medical history significant for Dieulafoy lesion bleeding with iron deficiency anemia who presents for a follow up visit.   #Iron Deficiency Anemia 2/2 to GI Bleeding --no evidence of bleeding on today's exam.  --most recent small bowel endoscopy from 09/19/2021 showed dieulafoy lesion with bleeding in the distal duodenum, a few cms distal to prior tattoo.Treated with argon plasma coagulation --last received IV venofer 200 mg x 2 doses on 10/15/2021 and 10/22/2021.  --unable to tolerate PO iron due to chronic constipation.  --labs today shows white blood cell 6.0, Hgb 11.8, MCV 89.8, Plt 251 --awaiting final results of iron panel to determine if IV iron is required.  --RTC in 1 and 3 months for labs and 6 months for clinic visits.   # Hypomagnesemia -- Patient has low levels of magnesium, unclear etiology.   May be secondary to diarrhea bouts --Patient takes 400 mg p.o. of magnesium daily.   --magnesium levels today pending. Last 1.5 on 04/14/2022.  --recommend to continue magnesium supplement.   #Jehovah's Witness --the patient is not to receive blood products under any circumstances, per her request. We have copied and uploaded her Blood Contract which she carries into her wallet into our system. --in the event the patient is admitted with severe anemia or bleed please make the Hematology service aware so that we may provide bloodless options for support.    No orders of the defined types were placed in this encounter.  All questions were answered. The patient knows to call the clinic with any problems, questions or concerns.  I have spent a total of 30 minutes minutes of face-to-face and non-face-to-face time, preparing to see the patient, performing a medically appropriate examination, counseling and educating the patient, ordering medications, documenting clinical information in the electronic health record, and care coordination.   Ulysees Barns, MD Department of Hematology/Oncology [facility] Phone: [phone] Pager: [phone] Email: [email or web address]  01/30/2023 5:25 PM

## 2023-01-31 ENCOUNTER — Inpatient Hospital Stay: Payer: No Typology Code available for payment source | Admitting: Hematology and Oncology

## 2023-01-31 ENCOUNTER — Inpatient Hospital Stay: Payer: No Typology Code available for payment source | Attending: Hematology and Oncology

## 2023-01-31 VITALS — BP 153/94 | HR 86 | Temp 98.0°F | Resp 16 | Wt 197.9 lb

## 2023-01-31 DIAGNOSIS — Z801 Family history of malignant neoplasm of trachea, bronchus and lung: Secondary | ICD-10-CM | POA: Diagnosis not present

## 2023-01-31 DIAGNOSIS — D500 Iron deficiency anemia secondary to blood loss (chronic): Secondary | ICD-10-CM | POA: Diagnosis not present

## 2023-01-31 DIAGNOSIS — K922 Gastrointestinal hemorrhage, unspecified: Secondary | ICD-10-CM | POA: Insufficient documentation

## 2023-01-31 DIAGNOSIS — Z806 Family history of leukemia: Secondary | ICD-10-CM | POA: Insufficient documentation

## 2023-01-31 DIAGNOSIS — Z807 Family history of other malignant neoplasms of lymphoid, hematopoietic and related tissues: Secondary | ICD-10-CM | POA: Diagnosis not present

## 2023-01-31 DIAGNOSIS — Z800 Family history of malignant neoplasm of digestive organs: Secondary | ICD-10-CM | POA: Insufficient documentation

## 2023-01-31 LAB — IRON AND IRON BINDING CAPACITY (CC-WL,HP ONLY)
Iron: 59 ug/dL (ref 28–170)
Saturation Ratios: 16 % (ref 10.4–31.8)
TIBC: 381 ug/dL (ref 250–450)
UIBC: 322 ug/dL (ref 148–442)

## 2023-01-31 LAB — CBC WITH DIFFERENTIAL (CANCER CENTER ONLY)
Abs Immature Granulocytes: 0.02 10*3/uL (ref 0.00–0.07)
Basophils Absolute: 0.1 10*3/uL (ref 0.0–0.1)
Basophils Relative: 1 %
Eosinophils Absolute: 0.2 10*3/uL (ref 0.0–0.5)
Eosinophils Relative: 4 %
HCT: 37 % (ref 36.0–46.0)
Hemoglobin: 11.8 g/dL — ABNORMAL LOW (ref 12.0–15.0)
Immature Granulocytes: 0 %
Lymphocytes Relative: 31 %
Lymphs Abs: 1.8 10*3/uL (ref 0.7–4.0)
MCH: 28.6 pg (ref 26.0–34.0)
MCHC: 31.9 g/dL (ref 30.0–36.0)
MCV: 89.8 fL (ref 80.0–100.0)
Monocytes Absolute: 0.5 10*3/uL (ref 0.1–1.0)
Monocytes Relative: 9 %
Neutro Abs: 3.3 10*3/uL (ref 1.7–7.7)
Neutrophils Relative %: 55 %
Platelet Count: 251 10*3/uL (ref 150–400)
RBC: 4.12 MIL/uL (ref 3.87–5.11)
RDW: 13.7 % (ref 11.5–15.5)
WBC Count: 6 10*3/uL (ref 4.0–10.5)
nRBC: 0 % (ref 0.0–0.2)

## 2023-01-31 LAB — CMP (CANCER CENTER ONLY)
ALT: 19 U/L (ref 0–44)
AST: 21 U/L (ref 15–41)
Albumin: 4.2 g/dL (ref 3.5–5.0)
Alkaline Phosphatase: 52 U/L (ref 38–126)
Anion gap: 6 (ref 5–15)
BUN: 16 mg/dL (ref 8–23)
CO2: 30 mmol/L (ref 22–32)
Calcium: 9.9 mg/dL (ref 8.9–10.3)
Chloride: 105 mmol/L (ref 98–111)
Creatinine: 0.81 mg/dL (ref 0.44–1.00)
GFR, Estimated: >60 mL/min (ref >60)
Glucose, Bld: 107 mg/dL — ABNORMAL HIGH (ref 70–99)
Potassium: 4.1 mmol/L (ref 3.5–5.1)
Sodium: 141 mmol/L (ref 135–145)
Total Bilirubin: 0.4 mg/dL (ref 0.0–1.2)
Total Protein: 6.9 g/dL (ref 6.5–8.1)

## 2023-01-31 LAB — RETIC PANEL
Immature Retic Fract: 13.8 % (ref 2.3–15.9)
RBC.: 4.07 MIL/uL (ref 3.87–5.11)
Retic Count, Absolute: 59 10*3/uL (ref 19.0–186.0)
Retic Ct Pct: 1.5 % (ref 0.4–3.1)
Reticulocyte Hemoglobin: 31.5 pg (ref >27.9)

## 2023-01-31 LAB — MAGNESIUM: Magnesium: 1.4 mg/dL — ABNORMAL LOW (ref 1.7–2.4)

## 2023-02-01 ENCOUNTER — Encounter: Payer: Self-pay | Admitting: Physician Assistant

## 2023-02-01 ENCOUNTER — Other Ambulatory Visit: Payer: Self-pay | Admitting: *Deleted

## 2023-02-01 ENCOUNTER — Telehealth: Payer: Self-pay | Admitting: *Deleted

## 2023-02-01 LAB — FERRITIN: Ferritin: 155 ng/mL (ref 11–307)

## 2023-02-01 MED ORDER — MAGNESIUM OXIDE -MG SUPPLEMENT 400 (240 MG) MG PO TABS: 400.0000 mg | ORAL_TABLET | Freq: Every day | ORAL | 2 refills | Status: DC

## 2023-02-01 NOTE — Telephone Encounter (Signed)
-----   Message from Ulysees Barns IV sent at 02/01/2023  2:09 PM EST ----- Please let Ms. Colavita know that her labs look good with a ferritin of 155.  Her magnesium however is quite low.  Please assure that she continues taking her magnesium supplementation.  Will plan to see her back for labs in 1 months time. ----- Message ----- From: Leory Plowman, Lab In [place] Sent: 01/31/2023   3:03 PM EST To: Jaci Standard, MD

## 2023-02-01 NOTE — Telephone Encounter (Signed)
Contacted patient per Dr. Derek Mound message below. Patient verbalized understanding. She asked to have her magnesium prescription refilled. States she has been out and that's why she was not taking it.

## 2023-02-08 IMAGING — MG MM DIGITAL SCREENING BILAT W/ TOMO AND CAD
8 series · 8 of 24 positions shown · non-contrast
Comparison: Previous exam(s).

CLINICAL DATA: Screening.

EXAM:
DIGITAL SCREENING BILATERAL MAMMOGRAM WITH TOMOSYNTHESIS AND CAD
TECHNIQUE: Bilateral screening digital craniocaudal and mediolateral oblique
mammograms were obtained. Bilateral screening digital breast
tomosynthesis was performed. The images were evaluated with
computer-aided detection.

[L CC synth-2D]
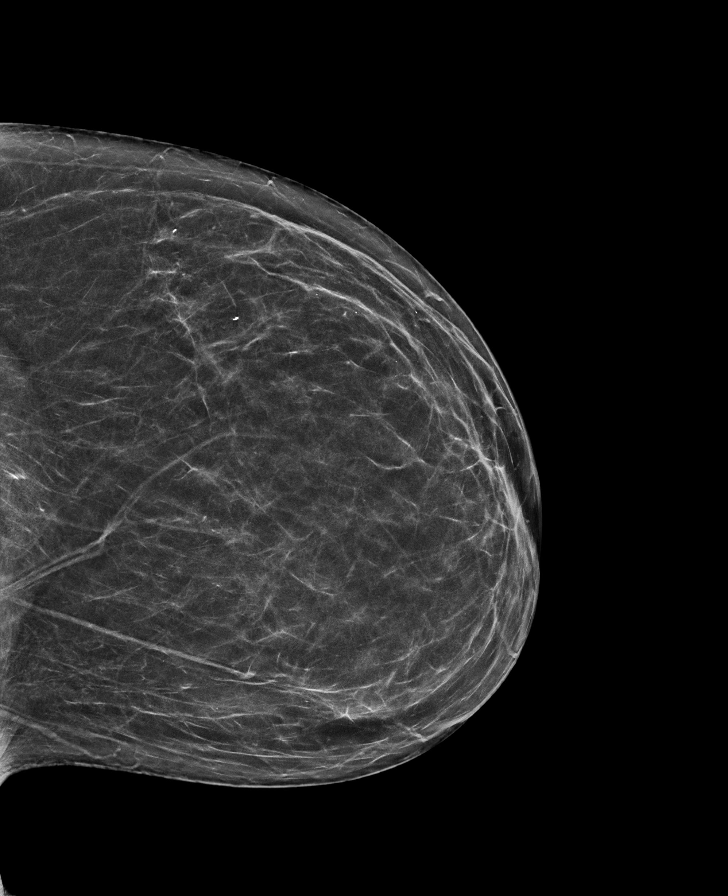

[L MLO synth-2D]
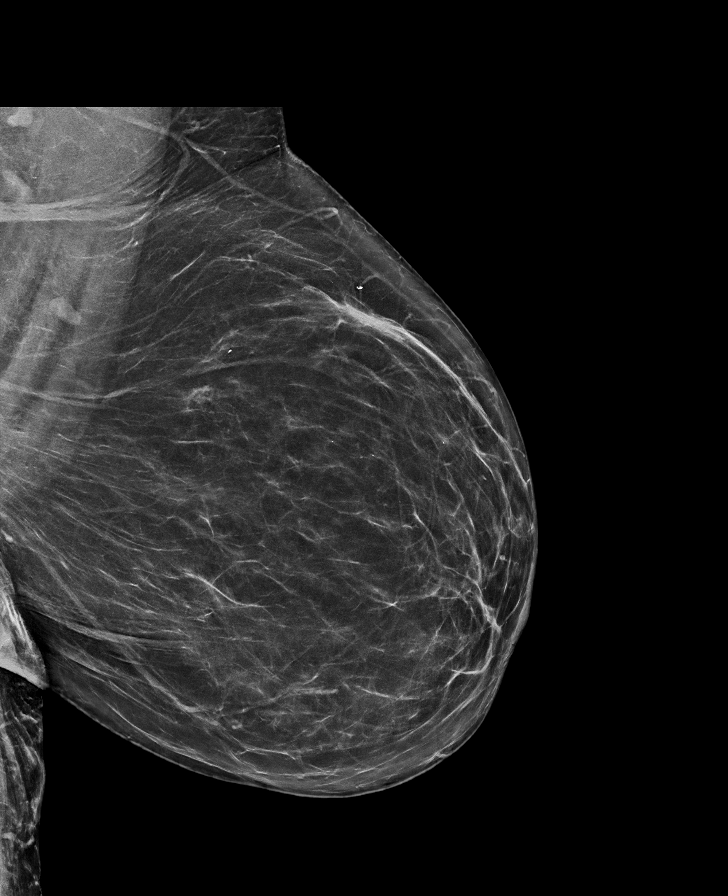

[R MLO synth-2D]
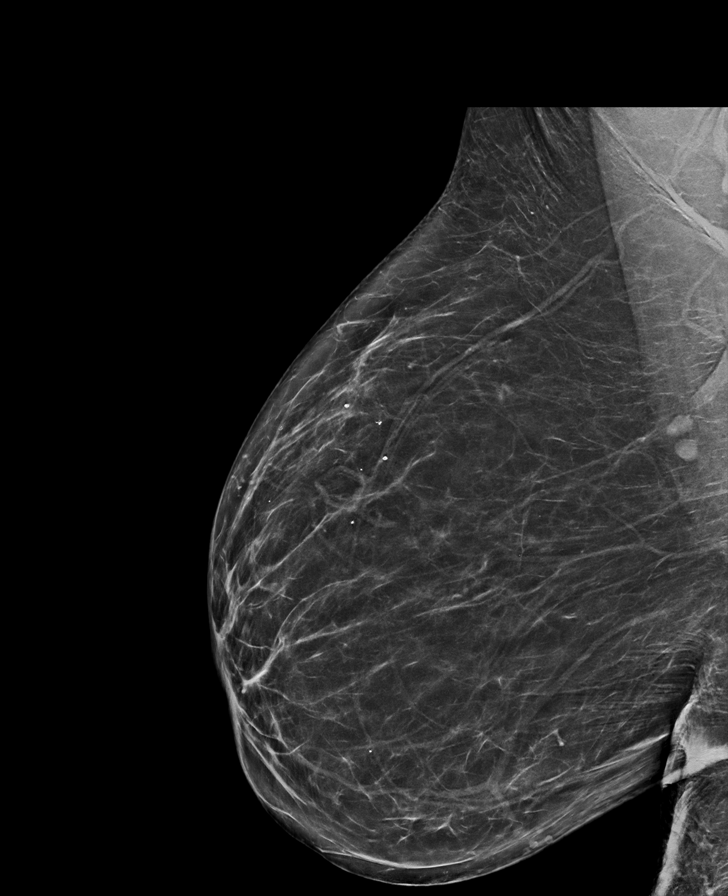

[R CC synth-2D]
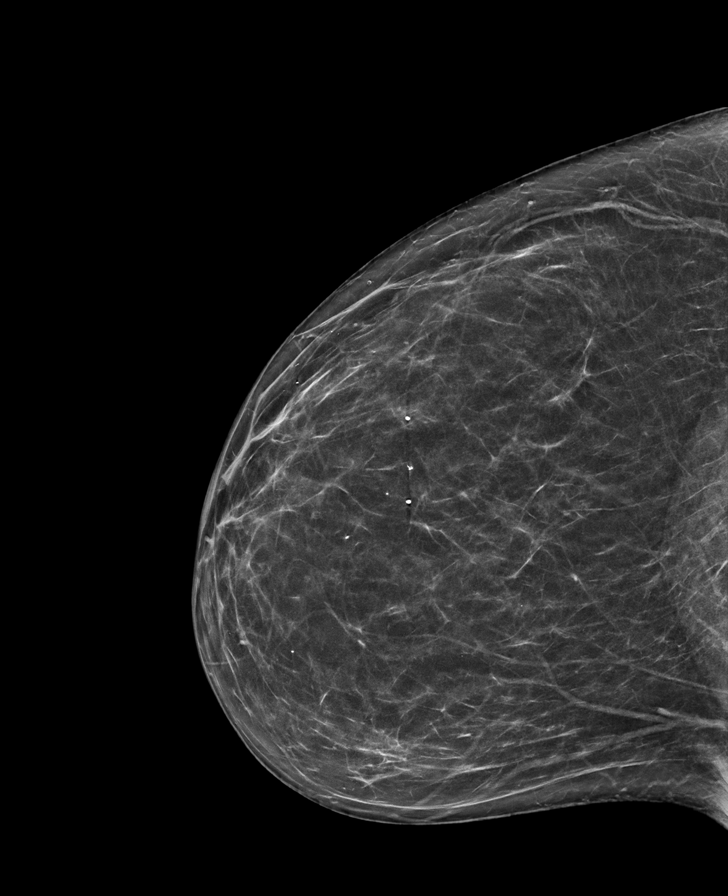

[L MLO tomo · tomo slice 37/74.0]
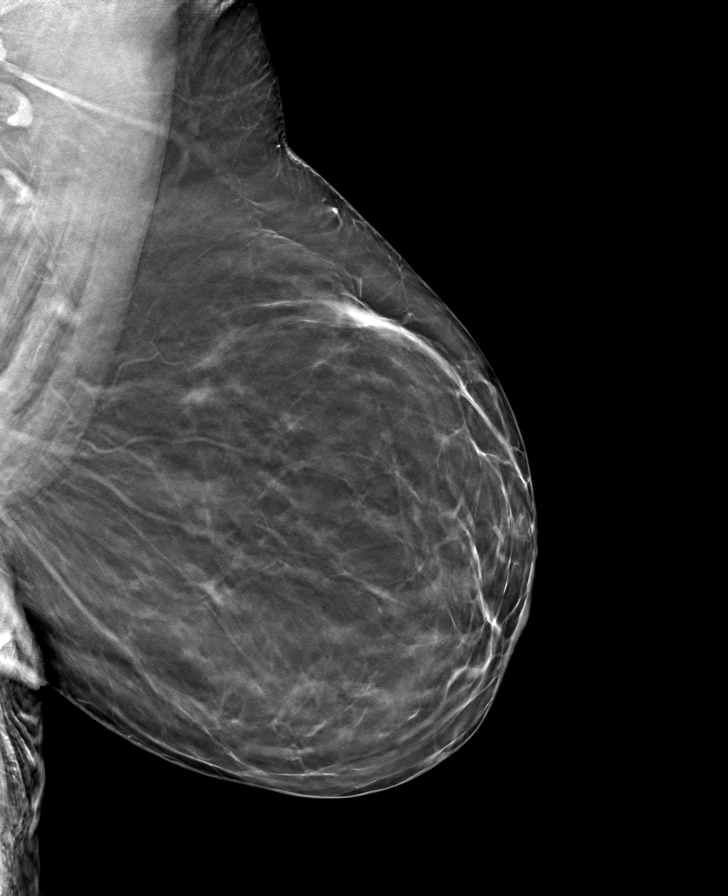

[R CC tomo · tomo slice 33/66.0]
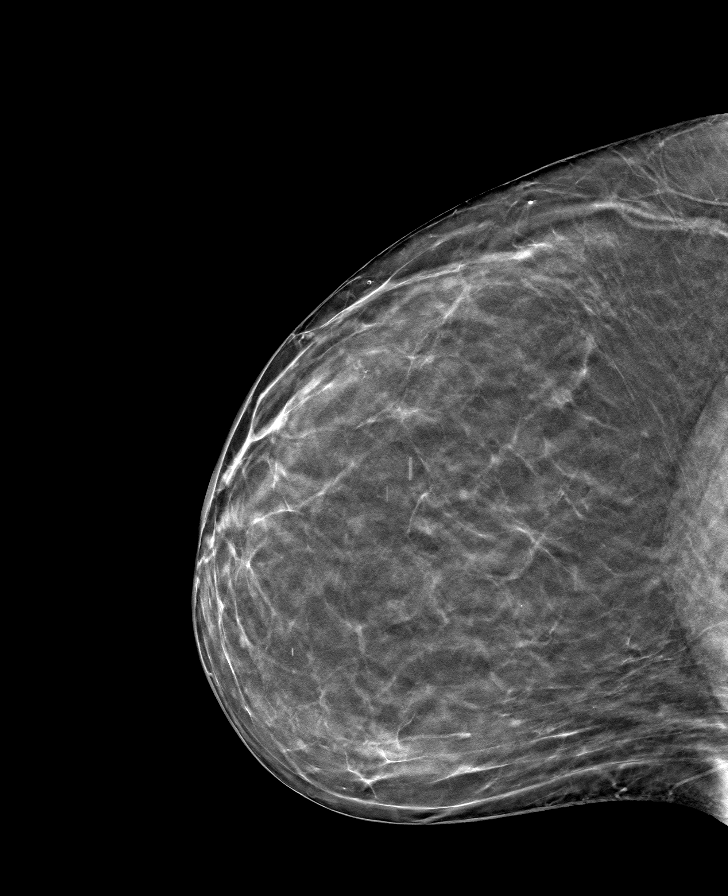

[R MLO tomo · tomo slice 39/77.0]
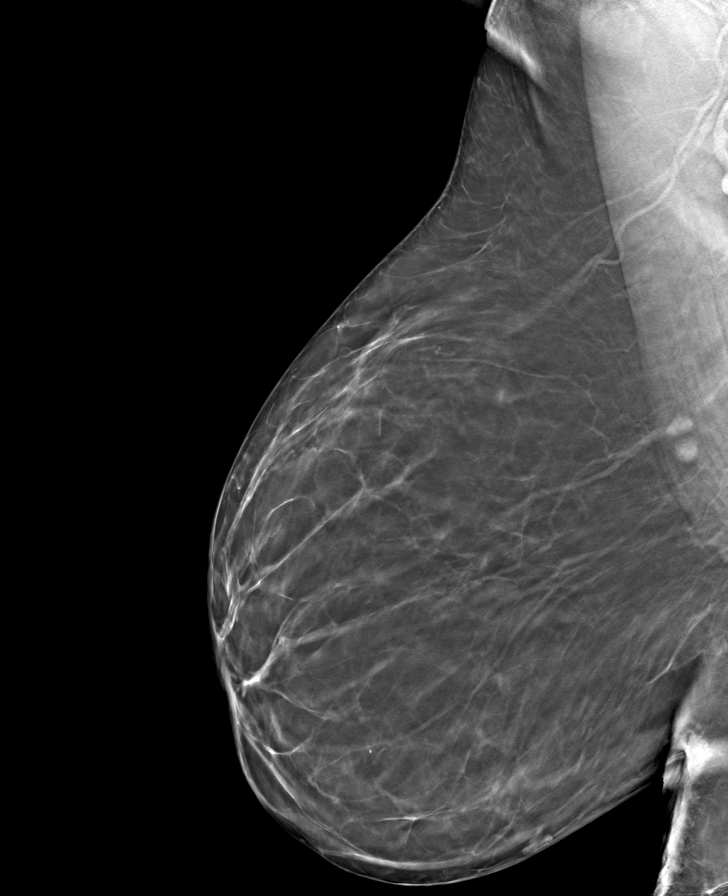

[L CC tomo · tomo slice 32/63.0]
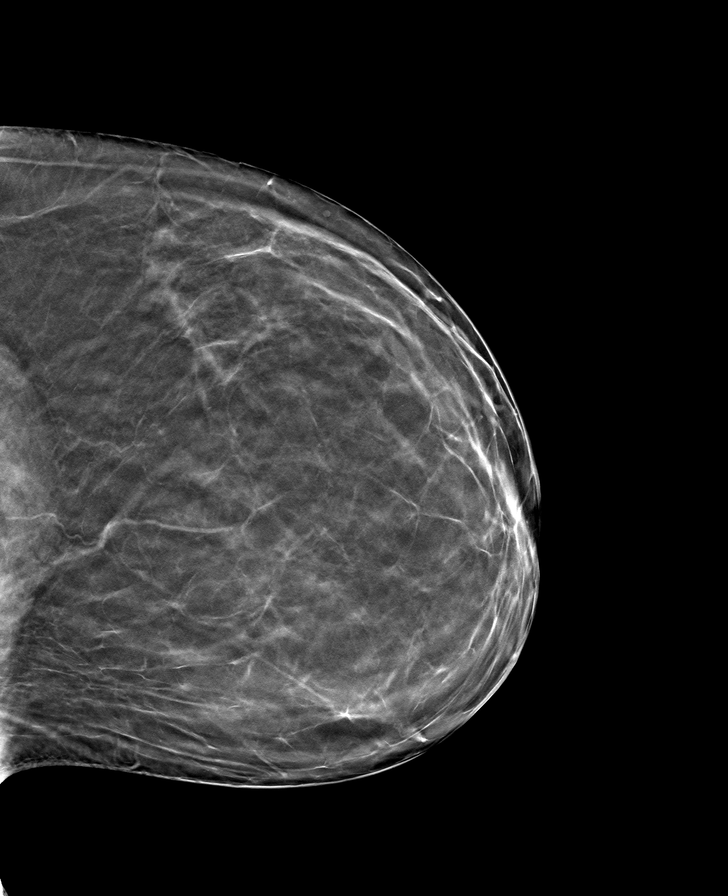

[8 of 24 positions shown; findings below may reference images not displayed]

ACR Breast Density Category b: There are scattered areas of
fibroglandular density.
FINDINGS: There are no findings suspicious for malignancy.
IMPRESSION: No mammographic evidence of malignancy. A result letter of this
screening mammogram will be mailed directly to the patient.

RECOMMENDATION:
Screening mammogram in one year. (Code:51-O-LD2)

BI-RADS CATEGORY  1: Negative.

## 2023-02-28 ENCOUNTER — Inpatient Hospital Stay: Payer: No Typology Code available for payment source | Attending: Hematology and Oncology

## 2023-02-28 ENCOUNTER — Other Ambulatory Visit: Payer: Self-pay | Admitting: Hematology and Oncology

## 2023-02-28 DIAGNOSIS — D500 Iron deficiency anemia secondary to blood loss (chronic): Secondary | ICD-10-CM

## 2023-02-28 LAB — RETIC PANEL
Immature Retic Fract: 23.3 % — ABNORMAL HIGH (ref 2.3–15.9)
RBC.: 3.84 MIL/uL — ABNORMAL LOW (ref 3.87–5.11)
Retic Count, Absolute: 84.1 10*3/uL (ref 19.0–186.0)
Retic Ct Pct: 2.2 % (ref 0.4–3.1)
Reticulocyte Hemoglobin: 31.5 pg (ref >27.9)

## 2023-02-28 LAB — CBC WITH DIFFERENTIAL (CANCER CENTER ONLY)
Abs Immature Granulocytes: 0.13 10*3/uL — ABNORMAL HIGH (ref 0.00–0.07)
Basophils Absolute: 0.1 10*3/uL (ref 0.0–0.1)
Basophils Relative: 1 %
Eosinophils Absolute: 0.2 10*3/uL (ref 0.0–0.5)
Eosinophils Relative: 3 %
HCT: 34 % — ABNORMAL LOW (ref 36.0–46.0)
Hemoglobin: 10.7 g/dL — ABNORMAL LOW (ref 12.0–15.0)
Immature Granulocytes: 2 %
Lymphocytes Relative: 28 %
Lymphs Abs: 2 10*3/uL (ref 0.7–4.0)
MCH: 27.9 pg (ref 26.0–34.0)
MCHC: 31.5 g/dL (ref 30.0–36.0)
MCV: 88.8 fL (ref 80.0–100.0)
Monocytes Absolute: 0.4 10*3/uL (ref 0.1–1.0)
Monocytes Relative: 6 %
Neutro Abs: 4.4 10*3/uL (ref 1.7–7.7)
Neutrophils Relative %: 60 %
Platelet Count: 284 10*3/uL (ref 150–400)
RBC: 3.83 MIL/uL — ABNORMAL LOW (ref 3.87–5.11)
RDW: 14 % (ref 11.5–15.5)
WBC Count: 7.2 10*3/uL (ref 4.0–10.5)
nRBC: 0 % (ref 0.0–0.2)

## 2023-02-28 LAB — CMP (CANCER CENTER ONLY)
ALT: 17 U/L (ref 0–44)
AST: 16 U/L (ref 15–41)
Albumin: 4.4 g/dL (ref 3.5–5.0)
Alkaline Phosphatase: 50 U/L (ref 38–126)
Anion gap: 7 (ref 5–15)
BUN: 21 mg/dL (ref 8–23)
CO2: 28 mmol/L (ref 22–32)
Calcium: 10.5 mg/dL — ABNORMAL HIGH (ref 8.9–10.3)
Chloride: 104 mmol/L (ref 98–111)
Creatinine: 0.75 mg/dL (ref 0.44–1.00)
GFR, Estimated: >60 mL/min (ref >60)
Glucose, Bld: 132 mg/dL — ABNORMAL HIGH (ref 70–99)
Potassium: 4.5 mmol/L (ref 3.5–5.1)
Sodium: 139 mmol/L (ref 135–145)
Total Bilirubin: 0.3 mg/dL (ref 0.0–1.2)
Total Protein: 7.2 g/dL (ref 6.5–8.1)

## 2023-02-28 LAB — IRON AND IRON BINDING CAPACITY (CC-WL,HP ONLY)
Iron: 52 ug/dL (ref 28–170)
Saturation Ratios: 13 % (ref 10.4–31.8)
TIBC: 398 ug/dL (ref 250–450)
UIBC: 346 ug/dL (ref 148–442)

## 2023-03-01 ENCOUNTER — Encounter: Payer: Self-pay | Admitting: Hematology and Oncology

## 2023-03-01 ENCOUNTER — Other Ambulatory Visit: Payer: Self-pay | Admitting: *Deleted

## 2023-03-01 ENCOUNTER — Telehealth: Payer: Self-pay | Admitting: Gastroenterology

## 2023-03-01 DIAGNOSIS — D500 Iron deficiency anemia secondary to blood loss (chronic): Secondary | ICD-10-CM

## 2023-03-01 LAB — FERRITIN: Ferritin: 149 ng/mL (ref 11–307)

## 2023-03-01 NOTE — Telephone Encounter (Signed)
 Inbound call from patient stating that she had her blood checked at her hematologist and her red blood cells are low, patient is requesting a call to discuss if she needs to come in to have her stool tested. Please advise.

## 2023-03-01 NOTE — Telephone Encounter (Signed)
 Patient is concerned that she had CBC done with hematology and her hemoglobin has decreased to 10.7 (4 weeks ago, was 11.8). She states she has had 2 GI bleeds before and is concerned that maybe she has another causing her hemoglobin to decrease. We discussed that while her level has decreased, it is not a significant amount and hematology will make recommendations as they see appropriate (does not look like they have reviewed yet). Patient denies any nausea, vomiting or abdominal pain. Denies any diarrhea or constipation. She does note that her stools have been darker over the last 1-2 days but they are not black nor tarry.   We discussed that should she begin noticing any black, tarry stools, develop abdominal pain, shortness of breath, dizziness or profound fatigue, she should present to the emergency room for urgent evaluation. Otherwise, may wait on hematology recommendations. She verbalizes understanding.

## 2023-03-02 ENCOUNTER — Telehealth: Payer: Self-pay | Admitting: Hematology and Oncology

## 2023-03-02 NOTE — Telephone Encounter (Signed)
 Dr C-  Hematology responded to patient yesterday evening with the following message:  "Dr. Leonides Schanz reviewed your most recent labs. He said the iron levels did not indicate that you need IV iron at this time.  He recommended you follow up with your gastroenterologist -- and then noted in your message that you have contacted Dr. Frankey Shown office.   Dr. Leonides Schanz recommended monitoring your blood counts and iron for any changes with labs every 1-2 weeks at the [facility] between now and your lab appointment in April. If you would like to do this, please let us know how often to schedule your lab appointments."  Do you just want to wait on next set of labs to see if hemoglobin stabilizes or do you feel I need to go ahead and set patient up for endo/enteroscopy?

## 2023-03-07 ENCOUNTER — Inpatient Hospital Stay: Payer: No Typology Code available for payment source

## 2023-03-07 ENCOUNTER — Other Ambulatory Visit: Payer: Self-pay

## 2023-03-07 DIAGNOSIS — D500 Iron deficiency anemia secondary to blood loss (chronic): Secondary | ICD-10-CM

## 2023-03-07 LAB — CBC WITH DIFFERENTIAL (CANCER CENTER ONLY)
Abs Immature Granulocytes: 0.04 10*3/uL (ref 0.00–0.07)
Basophils Absolute: 0.1 10*3/uL (ref 0.0–0.1)
Basophils Relative: 1 %
Eosinophils Absolute: 0.2 10*3/uL (ref 0.0–0.5)
Eosinophils Relative: 3 %
HCT: 33.5 % — ABNORMAL LOW (ref 36.0–46.0)
Hemoglobin: 10.6 g/dL — ABNORMAL LOW (ref 12.0–15.0)
Immature Granulocytes: 1 %
Lymphocytes Relative: 30 %
Lymphs Abs: 1.9 10*3/uL (ref 0.7–4.0)
MCH: 27.7 pg (ref 26.0–34.0)
MCHC: 31.6 g/dL (ref 30.0–36.0)
MCV: 87.7 fL (ref 80.0–100.0)
Monocytes Absolute: 0.5 10*3/uL (ref 0.1–1.0)
Monocytes Relative: 8 %
Neutro Abs: 3.7 10*3/uL (ref 1.7–7.7)
Neutrophils Relative %: 57 %
Platelet Count: 270 10*3/uL (ref 150–400)
RBC: 3.82 MIL/uL — ABNORMAL LOW (ref 3.87–5.11)
RDW: 14.6 % (ref 11.5–15.5)
WBC Count: 6.4 10*3/uL (ref 4.0–10.5)
nRBC: 0 % (ref 0.0–0.2)

## 2023-03-08 LAB — FERRITIN: Ferritin: 110 ng/mL (ref 11–307)

## 2023-03-14 ENCOUNTER — Inpatient Hospital Stay: Payer: No Typology Code available for payment source | Attending: Hematology and Oncology

## 2023-03-14 DIAGNOSIS — D500 Iron deficiency anemia secondary to blood loss (chronic): Secondary | ICD-10-CM | POA: Diagnosis present

## 2023-03-14 LAB — CBC WITH DIFFERENTIAL (CANCER CENTER ONLY)
Abs Immature Granulocytes: 0.02 10*3/uL (ref 0.00–0.07)
Basophils Absolute: 0.1 10*3/uL (ref 0.0–0.1)
Basophils Relative: 1 %
Eosinophils Absolute: 0.2 10*3/uL (ref 0.0–0.5)
Eosinophils Relative: 3 %
HCT: 33.7 % — ABNORMAL LOW (ref 36.0–46.0)
Hemoglobin: 10.6 g/dL — ABNORMAL LOW (ref 12.0–15.0)
Immature Granulocytes: 0 %
Lymphocytes Relative: 32 %
Lymphs Abs: 2.4 10*3/uL (ref 0.7–4.0)
MCH: 28 pg (ref 26.0–34.0)
MCHC: 31.5 g/dL (ref 30.0–36.0)
MCV: 88.9 fL (ref 80.0–100.0)
Monocytes Absolute: 0.8 10*3/uL (ref 0.1–1.0)
Monocytes Relative: 11 %
Neutro Abs: 3.9 10*3/uL (ref 1.7–7.7)
Neutrophils Relative %: 53 %
Platelet Count: 269 10*3/uL (ref 150–400)
RBC: 3.79 MIL/uL — ABNORMAL LOW (ref 3.87–5.11)
RDW: 14.6 % (ref 11.5–15.5)
WBC Count: 7.3 10*3/uL (ref 4.0–10.5)
nRBC: 0 % (ref 0.0–0.2)

## 2023-03-15 LAB — FERRITIN: Ferritin: 97 ng/mL (ref 11–307)

## 2023-03-21 ENCOUNTER — Inpatient Hospital Stay: Payer: No Typology Code available for payment source

## 2023-03-21 DIAGNOSIS — D500 Iron deficiency anemia secondary to blood loss (chronic): Secondary | ICD-10-CM | POA: Diagnosis not present

## 2023-03-21 LAB — CBC WITH DIFFERENTIAL (CANCER CENTER ONLY)
Abs Immature Granulocytes: 0.02 10*3/uL (ref 0.00–0.07)
Basophils Absolute: 0.1 10*3/uL (ref 0.0–0.1)
Basophils Relative: 1 %
Eosinophils Absolute: 0.2 10*3/uL (ref 0.0–0.5)
Eosinophils Relative: 3 %
HCT: 34.3 % — ABNORMAL LOW (ref 36.0–46.0)
Hemoglobin: 10.8 g/dL — ABNORMAL LOW (ref 12.0–15.0)
Immature Granulocytes: 0 %
Lymphocytes Relative: 30 %
Lymphs Abs: 2 10*3/uL (ref 0.7–4.0)
MCH: 27.9 pg (ref 26.0–34.0)
MCHC: 31.5 g/dL (ref 30.0–36.0)
MCV: 88.6 fL (ref 80.0–100.0)
Monocytes Absolute: 0.4 10*3/uL (ref 0.1–1.0)
Monocytes Relative: 7 %
Neutro Abs: 3.8 10*3/uL (ref 1.7–7.7)
Neutrophils Relative %: 59 %
Platelet Count: 251 10*3/uL (ref 150–400)
RBC: 3.87 MIL/uL (ref 3.87–5.11)
RDW: 14.6 % (ref 11.5–15.5)
WBC Count: 6.5 10*3/uL (ref 4.0–10.5)
nRBC: 0 % (ref 0.0–0.2)

## 2023-03-22 LAB — FERRITIN: Ferritin: 88 ng/mL (ref 11–307)

## 2023-03-23 ENCOUNTER — Other Ambulatory Visit: Payer: Self-pay | Admitting: Physician Assistant

## 2023-03-23 DIAGNOSIS — D649 Anemia, unspecified: Secondary | ICD-10-CM

## 2023-03-24 ENCOUNTER — Telehealth: Payer: Self-pay | Admitting: Hematology and Oncology

## 2023-03-24 ENCOUNTER — Telehealth: Payer: Self-pay | Admitting: *Deleted

## 2023-03-24 NOTE — Telephone Encounter (Signed)
 TCT patient regarding recent lab results. No answer but was able to leave vm message for her to return this call to [phone] at her earliest convenience.

## 2023-03-24 NOTE — Telephone Encounter (Signed)
-----   Message from Ulysees Barns IV sent at 03/24/2023  8:45 AM EDT ----- Please let Mrs. Sallas know that her Hgb improved to 10.8, iron levels remain steady.  Our next labs are 4/14, but if she would like another round of labs in 2 weeks we can set that up for her. ----- Message ----- From: Leory Plowman, Lab In [place] Sent: 03/21/2023   3:50 PM EDT To: Jaci Standard, MD

## 2023-03-24 NOTE — Telephone Encounter (Signed)
 Received call back from patient. Advised that her Hgb improved to 10.8, iron levels remain steady. Our next labs are 4/14, but if she would like another round of labs in 2 weeks we can set that up for her.  Pt states she would like to come back in 2 weeks. Advised that she can expect a call from scheduling for labs on 04/04/22.  Scheduling message sent

## 2023-03-25 ENCOUNTER — Other Ambulatory Visit

## 2023-04-04 ENCOUNTER — Inpatient Hospital Stay

## 2023-04-04 DIAGNOSIS — D500 Iron deficiency anemia secondary to blood loss (chronic): Secondary | ICD-10-CM

## 2023-04-04 DIAGNOSIS — D649 Anemia, unspecified: Secondary | ICD-10-CM

## 2023-04-04 LAB — VITAMIN B12: Vitamin B-12: 822 pg/mL (ref 180–914)

## 2023-04-04 LAB — CBC WITH DIFFERENTIAL (CANCER CENTER ONLY)
Abs Immature Granulocytes: 0.01 10*3/uL (ref 0.00–0.07)
Basophils Absolute: 0.1 10*3/uL (ref 0.0–0.1)
Basophils Relative: 1 %
Eosinophils Absolute: 0.2 10*3/uL (ref 0.0–0.5)
Eosinophils Relative: 3 %
HCT: 35.2 % — ABNORMAL LOW (ref 36.0–46.0)
Hemoglobin: 11 g/dL — ABNORMAL LOW (ref 12.0–15.0)
Immature Granulocytes: 0 %
Lymphocytes Relative: 29 %
Lymphs Abs: 1.9 10*3/uL (ref 0.7–4.0)
MCH: 27.4 pg (ref 26.0–34.0)
MCHC: 31.3 g/dL (ref 30.0–36.0)
MCV: 87.8 fL (ref 80.0–100.0)
Monocytes Absolute: 0.5 10*3/uL (ref 0.1–1.0)
Monocytes Relative: 7 %
Neutro Abs: 4.1 10*3/uL (ref 1.7–7.7)
Neutrophils Relative %: 60 %
Platelet Count: 295 10*3/uL (ref 150–400)
RBC: 4.01 MIL/uL (ref 3.87–5.11)
RDW: 14.3 % (ref 11.5–15.5)
WBC Count: 6.7 10*3/uL (ref 4.0–10.5)
nRBC: 0 % (ref 0.0–0.2)

## 2023-04-04 LAB — FOLATE: Folate: 23.5 ng/mL (ref >5.9)

## 2023-04-04 LAB — CMP (CANCER CENTER ONLY)
ALT: 18 U/L (ref 0–44)
AST: 18 U/L (ref 15–41)
Albumin: 4.7 g/dL (ref 3.5–5.0)
Alkaline Phosphatase: 51 U/L (ref 38–126)
Anion gap: 8 (ref 5–15)
BUN: 17 mg/dL (ref 8–23)
CO2: 27 mmol/L (ref 22–32)
Calcium: 10.1 mg/dL (ref 8.9–10.3)
Chloride: 104 mmol/L (ref 98–111)
Creatinine: 0.83 mg/dL (ref 0.44–1.00)
GFR, Estimated: >60 mL/min (ref >60)
Glucose, Bld: 114 mg/dL — ABNORMAL HIGH (ref 70–99)
Potassium: 3.5 mmol/L (ref 3.5–5.1)
Sodium: 139 mmol/L (ref 135–145)
Total Bilirubin: 0.4 mg/dL (ref 0.0–1.2)
Total Protein: 8 g/dL (ref 6.5–8.1)

## 2023-04-05 LAB — FERRITIN: Ferritin: 94 ng/mL (ref 11–307)

## 2023-04-05 LAB — KAPPA/LAMBDA LIGHT CHAINS
Kappa free light chain: 22.3 mg/L — ABNORMAL HIGH (ref 3.3–19.4)
Kappa, lambda light chain ratio: 1.28 (ref 0.26–1.65)
Lambda free light chains: 17.4 mg/L (ref 5.7–26.3)

## 2023-04-07 LAB — MULTIPLE MYELOMA PANEL, SERUM
Albumin SerPl Elph-Mcnc: 3.8 g/dL (ref 2.9–4.4)
Albumin/Glob SerPl: 1.1 (ref 0.7–1.7)
Alpha 1: 0.3 g/dL (ref 0.0–0.4)
Alpha2 Glob SerPl Elph-Mcnc: 1.2 g/dL — ABNORMAL HIGH (ref 0.4–1.0)
B-Globulin SerPl Elph-Mcnc: 1.2 g/dL (ref 0.7–1.3)
Gamma Glob SerPl Elph-Mcnc: 0.8 g/dL (ref 0.4–1.8)
Globulin, Total: 3.6 g/dL (ref 2.2–3.9)
IgA: 139 mg/dL (ref 64–422)
IgG (Immunoglobin G), Serum: 898 mg/dL (ref 586–1602)
IgM (Immunoglobulin M), Srm: 51 mg/dL (ref 26–217)
Total Protein ELP: 7.4 g/dL (ref 6.0–8.5)

## 2023-04-07 LAB — COPPER, SERUM: Copper: 111 ug/dL (ref 80–158)

## 2023-04-11 LAB — METHYLMALONIC ACID, SERUM: Methylmalonic Acid, Quantitative: 191 nmol/L (ref 0–378)

## 2023-04-13 DIAGNOSIS — D500 Iron deficiency anemia secondary to blood loss (chronic): Secondary | ICD-10-CM

## 2023-04-24 ENCOUNTER — Other Ambulatory Visit: Payer: Self-pay | Admitting: Hematology and Oncology

## 2023-04-25 ENCOUNTER — Inpatient Hospital Stay: Payer: No Typology Code available for payment source | Attending: Hematology and Oncology

## 2023-04-25 DIAGNOSIS — D500 Iron deficiency anemia secondary to blood loss (chronic): Secondary | ICD-10-CM | POA: Diagnosis present

## 2023-04-25 LAB — CBC WITH DIFFERENTIAL (CANCER CENTER ONLY)
Abs Immature Granulocytes: 0.03 10*3/uL (ref 0.00–0.07)
Basophils Absolute: 0.1 10*3/uL (ref 0.0–0.1)
Basophils Relative: 1 %
Eosinophils Absolute: 0.2 10*3/uL (ref 0.0–0.5)
Eosinophils Relative: 4 %
HCT: 33.4 % — ABNORMAL LOW (ref 36.0–46.0)
Hemoglobin: 10.4 g/dL — ABNORMAL LOW (ref 12.0–15.0)
Immature Granulocytes: 1 %
Lymphocytes Relative: 30 %
Lymphs Abs: 1.9 10*3/uL (ref 0.7–4.0)
MCH: 26.7 pg (ref 26.0–34.0)
MCHC: 31.1 g/dL (ref 30.0–36.0)
MCV: 85.9 fL (ref 80.0–100.0)
Monocytes Absolute: 0.5 10*3/uL (ref 0.1–1.0)
Monocytes Relative: 8 %
Neutro Abs: 3.7 10*3/uL (ref 1.7–7.7)
Neutrophils Relative %: 56 %
Platelet Count: 273 10*3/uL (ref 150–400)
RBC: 3.89 MIL/uL (ref 3.87–5.11)
RDW: 14.3 % (ref 11.5–15.5)
WBC Count: 6.4 10*3/uL (ref 4.0–10.5)
nRBC: 0 % (ref 0.0–0.2)

## 2023-04-25 LAB — FERRITIN: Ferritin: 45 ng/mL (ref 11–307)

## 2023-05-03 ENCOUNTER — Telehealth: Payer: Self-pay | Admitting: Hematology and Oncology

## 2023-05-03 NOTE — Telephone Encounter (Signed)
 Dana Nielsen left a VM to reschedule her appts. I called [name] and she is aware of all of her appointment details.

## 2023-05-06 ENCOUNTER — Inpatient Hospital Stay

## 2023-05-06 DIAGNOSIS — D500 Iron deficiency anemia secondary to blood loss (chronic): Secondary | ICD-10-CM

## 2023-05-06 LAB — CBC WITH DIFFERENTIAL (CANCER CENTER ONLY)
Abs Immature Granulocytes: 0.03 10*3/uL (ref 0.00–0.07)
Basophils Absolute: 0.1 10*3/uL (ref 0.0–0.1)
Basophils Relative: 1 %
Eosinophils Absolute: 0.2 10*3/uL (ref 0.0–0.5)
Eosinophils Relative: 3 %
HCT: 35.2 % — ABNORMAL LOW (ref 36.0–46.0)
Hemoglobin: 11 g/dL — ABNORMAL LOW (ref 12.0–15.0)
Immature Granulocytes: 0 %
Lymphocytes Relative: 27 %
Lymphs Abs: 1.9 10*3/uL (ref 0.7–4.0)
MCH: 26.3 pg (ref 26.0–34.0)
MCHC: 31.3 g/dL (ref 30.0–36.0)
MCV: 84.2 fL (ref 80.0–100.0)
Monocytes Absolute: 0.5 10*3/uL (ref 0.1–1.0)
Monocytes Relative: 7 %
Neutro Abs: 4.3 10*3/uL (ref 1.7–7.7)
Neutrophils Relative %: 62 %
Platelet Count: 262 10*3/uL (ref 150–400)
RBC: 4.18 MIL/uL (ref 3.87–5.11)
RDW: 14.6 % (ref 11.5–15.5)
WBC Count: 7 10*3/uL (ref 4.0–10.5)
nRBC: 0 % (ref 0.0–0.2)

## 2023-05-06 LAB — FERRITIN: Ferritin: 69 ng/mL (ref 11–307)

## 2023-05-09 ENCOUNTER — Other Ambulatory Visit

## 2023-05-10 ENCOUNTER — Encounter: Payer: Self-pay | Admitting: Physician Assistant

## 2023-05-10 NOTE — Telephone Encounter (Signed)
-----   Message from Nurse Katherne Pander sent at 05/10/2023  6:08 PM EDT -----  ----- Message ----- From: Ander Bame, MD Sent: 05/10/2023   3:51 PM EDT To: Colin Dawley, RN  Please let Ms. Caffrey know that her hemoglobin level has improved to 11.0.  If she would like we can continue to order labs and every 3 to 4-week basis, based on her preferences.  They are currently scheduled to see her back in July.  Please send a message to scheduling for labs per her request. ----- Message ----- From: Dannis Dy, Lab In [place] Sent: 05/06/2023   2:34 PM EDT To: Ander Bame, MD

## 2023-05-10 NOTE — Telephone Encounter (Signed)
 Called pt to inform of 11.0 hgb.  She would like to get labs every 3 wks.  Message to schedulers to schedule.

## 2023-05-11 ENCOUNTER — Telehealth: Payer: Self-pay | Admitting: Hematology and Oncology

## 2023-05-20 ENCOUNTER — Other Ambulatory Visit: Payer: Self-pay | Admitting: Hematology and Oncology

## 2023-05-29 LAB — COLOGUARD: COLOGUARD: POSITIVE — AB

## 2023-05-30 ENCOUNTER — Telehealth: Payer: Self-pay | Admitting: Gastroenterology

## 2023-05-30 ENCOUNTER — Inpatient Hospital Stay: Attending: Hematology and Oncology

## 2023-05-30 DIAGNOSIS — D500 Iron deficiency anemia secondary to blood loss (chronic): Secondary | ICD-10-CM | POA: Insufficient documentation

## 2023-05-30 LAB — CBC WITH DIFFERENTIAL (CANCER CENTER ONLY)
Abs Immature Granulocytes: 0.04 10*3/uL (ref 0.00–0.07)
Basophils Absolute: 0 10*3/uL (ref 0.0–0.1)
Basophils Relative: 0 %
Eosinophils Absolute: 0.1 10*3/uL (ref 0.0–0.5)
Eosinophils Relative: 1 %
HCT: 34.6 % — ABNORMAL LOW (ref 36.0–46.0)
Hemoglobin: 11.2 g/dL — ABNORMAL LOW (ref 12.0–15.0)
Immature Granulocytes: 0 %
Lymphocytes Relative: 19 %
Lymphs Abs: 1.7 10*3/uL (ref 0.7–4.0)
MCH: 26.7 pg (ref 26.0–34.0)
MCHC: 32.4 g/dL (ref 30.0–36.0)
MCV: 82.6 fL (ref 80.0–100.0)
Monocytes Absolute: 0.8 10*3/uL (ref 0.1–1.0)
Monocytes Relative: 9 %
Neutro Abs: 6.3 10*3/uL (ref 1.7–7.7)
Neutrophils Relative %: 71 %
Platelet Count: 207 10*3/uL (ref 150–400)
RBC: 4.19 MIL/uL (ref 3.87–5.11)
RDW: 15.7 % — ABNORMAL HIGH (ref 11.5–15.5)
WBC Count: 9.1 10*3/uL (ref 4.0–10.5)
nRBC: 0 % (ref 0.0–0.2)

## 2023-05-30 NOTE — Telephone Encounter (Signed)
 Dr. Karene Oto, OK to direct book colonoscopy for + cologuard or do you prefer to see patient for OV?

## 2023-05-30 NOTE — Telephone Encounter (Signed)
 PT recently ordered a cologard and it came back positive. She would like to find out if she can just schedule a procedure or would he like to see her in the office first. Please advise.

## 2023-05-30 NOTE — Telephone Encounter (Signed)
 Left voicemail for patient to call and schedule direct colonoscopy.

## 2023-05-31 ENCOUNTER — Encounter: Payer: Self-pay | Admitting: Hematology and Oncology

## 2023-05-31 LAB — FERRITIN: Ferritin: 132 ng/mL (ref 11–307)

## 2023-06-07 NOTE — Telephone Encounter (Signed)
 Patient is scheduled for PV on 06/15/23 at 4 pm, colonoscopy scheduled for 07/06/23 at 2 pm

## 2023-06-15 ENCOUNTER — Other Ambulatory Visit: Payer: Self-pay

## 2023-06-15 ENCOUNTER — Other Ambulatory Visit: Payer: Self-pay | Admitting: Hematology and Oncology

## 2023-06-15 ENCOUNTER — Ambulatory Visit (AMBULATORY_SURGERY_CENTER)

## 2023-06-15 VITALS — Ht 62.0 in | Wt 190.0 lb

## 2023-06-15 DIAGNOSIS — Z1211 Encounter for screening for malignant neoplasm of colon: Secondary | ICD-10-CM

## 2023-06-15 DIAGNOSIS — R195 Other fecal abnormalities: Secondary | ICD-10-CM

## 2023-06-15 MED ORDER — NA SULFATE-K SULFATE-MG SULF 17.5-3.13-1.6 GM/177ML PO SOLN: 1.0000 | Freq: Once | ORAL | 0 refills | Status: AC

## 2023-06-15 NOTE — Progress Notes (Signed)
 Denies allergies to eggs or soy products. Denies complication of anesthesia or sedation. Denies use of weight loss medication. Denies use of O2.   Emmi instructions given for colonoscopy.

## 2023-06-20 ENCOUNTER — Inpatient Hospital Stay: Attending: Hematology and Oncology

## 2023-06-20 DIAGNOSIS — D500 Iron deficiency anemia secondary to blood loss (chronic): Secondary | ICD-10-CM | POA: Insufficient documentation

## 2023-06-20 LAB — CBC WITH DIFFERENTIAL (CANCER CENTER ONLY)
Abs Immature Granulocytes: 0.01 10*3/uL (ref 0.00–0.07)
Basophils Absolute: 0.1 10*3/uL (ref 0.0–0.1)
Basophils Relative: 1 %
Eosinophils Absolute: 0.2 10*3/uL (ref 0.0–0.5)
Eosinophils Relative: 2 %
HCT: 34.3 % — ABNORMAL LOW (ref 36.0–46.0)
Hemoglobin: 11 g/dL — ABNORMAL LOW (ref 12.0–15.0)
Immature Granulocytes: 0 %
Lymphocytes Relative: 35 %
Lymphs Abs: 2.3 10*3/uL (ref 0.7–4.0)
MCH: 26 pg (ref 26.0–34.0)
MCHC: 32.1 g/dL (ref 30.0–36.0)
MCV: 81.1 fL (ref 80.0–100.0)
Monocytes Absolute: 0.6 10*3/uL (ref 0.1–1.0)
Monocytes Relative: 9 %
Neutro Abs: 3.5 10*3/uL (ref 1.7–7.7)
Neutrophils Relative %: 53 %
Platelet Count: 283 10*3/uL (ref 150–400)
RBC: 4.23 MIL/uL (ref 3.87–5.11)
RDW: 15.8 % — ABNORMAL HIGH (ref 11.5–15.5)
WBC Count: 6.6 10*3/uL (ref 4.0–10.5)
nRBC: 0 % (ref 0.0–0.2)

## 2023-06-21 ENCOUNTER — Other Ambulatory Visit: Payer: Self-pay | Admitting: Hematology and Oncology

## 2023-06-21 LAB — FERRITIN: Ferritin: 84 ng/mL (ref 11–307)

## 2023-06-22 ENCOUNTER — Encounter: Payer: Self-pay | Admitting: Physician Assistant

## 2023-07-06 ENCOUNTER — Ambulatory Visit: Admitting: Gastroenterology

## 2023-07-06 ENCOUNTER — Encounter: Payer: Self-pay | Admitting: Gastroenterology

## 2023-07-06 VITALS — BP 137/71 | HR 92 | Temp 97.7°F | Resp 15 | Ht 62.0 in | Wt 190.0 lb

## 2023-07-06 DIAGNOSIS — K5730 Diverticulosis of large intestine without perforation or abscess without bleeding: Secondary | ICD-10-CM

## 2023-07-06 DIAGNOSIS — R195 Other fecal abnormalities: Secondary | ICD-10-CM | POA: Diagnosis not present

## 2023-07-06 DIAGNOSIS — D175 Benign lipomatous neoplasm of intra-abdominal organs: Secondary | ICD-10-CM

## 2023-07-06 DIAGNOSIS — D125 Benign neoplasm of sigmoid colon: Secondary | ICD-10-CM

## 2023-07-06 DIAGNOSIS — Z1211 Encounter for screening for malignant neoplasm of colon: Secondary | ICD-10-CM

## 2023-07-06 MED ORDER — SODIUM CHLORIDE 0.9 % IV SOLN: 500.0000 mL | INTRAVENOUS | Status: DC

## 2023-07-06 NOTE — Progress Notes (Signed)
 Called to room to assist during endoscopic procedure.  Patient ID and intended procedure confirmed with present staff. Received instructions for my participation in the procedure from the performing physician.

## 2023-07-06 NOTE — Patient Instructions (Signed)
Discharge instructions given. Handouts on polyps and Diverticulosis. Resume previous medications. YOU HAD AN ENDOSCOPIC PROCEDURE TODAY AT THE [facility]:   Refer to the procedure report that was given to you for any specific questions about what was found during the examination.  If the procedure report does not answer your questions, please call your gastroenterologist to clarify.  If you requested that your care partner not be given the details of your procedure findings, then the procedure report has been included in a sealed envelope for you to review at your convenience later.  YOU SHOULD EXPECT: Some feelings of bloating in the abdomen. Passage of more gas than usual.  Walking can help get rid of the air that was put into your GI tract during the procedure and reduce the bloating. If you had a lower endoscopy (such as a colonoscopy or flexible sigmoidoscopy) you may notice spotting of blood in your stool or on the toilet paper. If you underwent a bowel prep for your procedure, you may not have a normal bowel movement for a few days.  Please Note:  You might notice some irritation and congestion in your nose or some drainage.  This is from the oxygen used during your procedure.  There is no need for concern and it should clear up in a day or so.  SYMPTOMS TO REPORT IMMEDIATELY:  Following lower endoscopy (colonoscopy or flexible sigmoidoscopy):  Excessive amounts of blood in the stool  Significant tenderness or worsening of abdominal pains  Swelling of the abdomen that is new, acute  Fever of 100F or higher  For urgent or emergent issues, a gastroenterologist can be reached at any hour by calling [phone]. Do not use MyChart messaging for urgent concerns.    DIET:  We do recommend a small meal at first, but then you may proceed to your regular diet.  Drink plenty of fluids but you should avoid alcoholic beverages for 24 hours.  ACTIVITY:  You should plan to take it  easy for the rest of today and you should NOT DRIVE or use heavy machinery until tomorrow (because of the sedation medicines used during the test).    FOLLOW UP: Our staff will call the number listed on your records the next business day following your procedure.  We will call around 7:15- 8:00 am to check on you and address any questions or concerns that you may have regarding the information given to you following your procedure. If we do not reach you, we will leave a message.     If any biopsies were taken you will be contacted by phone or by letter within the next 1-3 weeks.  Please call us at [phone] if you have not heard about the biopsies in 3 weeks.    SIGNATURES/CONFIDENTIALITY: You and/or your care partner have signed paperwork which will be entered into your electronic medical record.  These signatures attest to the fact that that the information above on your After Visit Summary has been reviewed and is understood.  Full responsibility of the confidentiality of this discharge information lies with you and/or your care-partner. 

## 2023-07-06 NOTE — Progress Notes (Signed)
 A/o x 3, VSS, gd SR's, pleased with anesthesia, report to RN

## 2023-07-06 NOTE — Progress Notes (Signed)
 Pt's states no medical or surgical changes since previsit or office visit.

## 2023-07-06 NOTE — Op Note (Signed)
 [facility] Patient Name: Dana Nielsen Procedure Date: 07/06/2023 3:05 PM MRN: [id number] Endoscopist: Sandor Flatter , MD, [id number] Age: 78 Referring MD:  Date of Birth: 07-16-1945 Gender: Female Account #: [id number] Procedure:                Colonoscopy Indications:              Positive Cologuard test Medicines:                Monitored Anesthesia Care Procedure:                Pre-Anesthesia Assessment:                           - Prior to the procedure, a History and Physical                            was performed, and patient medications and                            allergies were reviewed. The patient's tolerance of                            previous anesthesia was also reviewed. The risks                            and benefits of the procedure and the sedation                            options and risks were discussed with the patient.                            All questions were answered, and informed consent                            was obtained. Prior Anticoagulants: The patient has                            taken no anticoagulant or antiplatelet agents. ASA                            Grade Assessment: III - A patient with severe                            systemic disease. After reviewing the risks and                            benefits, the patient was deemed in satisfactory                            condition to undergo the procedure.                           After obtaining informed consent, the colonoscope  was passed under direct vision. Throughout the                            procedure, the patient's blood pressure, pulse, and                            oxygen saturations were monitored continuously. The                            PCF-HQ190L Colonoscope [id number] was introduced                            through the anus and advanced to the 10 cm into the                            ileum. The colonoscopy was  performed without                            difficulty. The patient tolerated the procedure                            well. The quality of the bowel preparation was                            good. The terminal ileum, ileocecal valve,                            appendiceal orifice, and rectum were photographed. Scope In: 3:16:39 PM Scope Out: 3:34:14 PM Scope Withdrawal Time: 0 hours 13 minutes 32 seconds  Total Procedure Duration: 0 hours 17 minutes 35 seconds  Findings:                 The perianal and digital rectal examinations were                            normal.                           Two sessile polyps were found in the distal sigmoid                            colon. The polyps were 2 to 4 mm in size. These                            polyps were removed with a cold snare. Resection                            and retrieval were complete. Estimated blood loss                            was minimal.                           A few small-mouthed diverticula were found in the  sigmoid colon and ascending colon.                           There was a medium-sized lipoma, in the ascending                            colon.                           The retroflexed view of the distal rectum and anal                            verge was normal and showed no anal or rectal                            abnormalities.                           The terminal ileum appeared normal. Complications:            No immediate complications. Estimated Blood Loss:     Estimated blood loss was minimal. Impression:               - Two 2 to 4 mm polyps in the distal sigmoid colon,                            removed with a cold snare. Resected and retrieved.                           - Diverticulosis in the sigmoid colon and in the                            ascending colon.                           - Medium-sized lipoma in the ascending colon.                           -  The distal rectum and anal verge are normal on                            retroflexion view.                           - The examined portion of the ileum was normal. Recommendation:           - Patient has a contact number available for                            emergencies. The signs and symptoms of potential                            delayed complications were discussed with the                            patient. Return to  normal activities tomorrow.                            Written discharge instructions were provided to the                            patient.                           - Resume previous diet.                           - Continue present medications.                           - Await pathology results.                           - Repeat colonoscopy for surveillance based on                            pathology results.                           - Return to GI office PRN. Sandor Flatter, MD 07/06/2023 3:40:21 PM

## 2023-07-06 NOTE — Progress Notes (Signed)
 GASTROENTEROLOGY PROCEDURE H&P NOTE   Primary Care Physician: Pura Lenis, MD    Reason for Procedure:  Cologuard+, Colon Cancer screening  Plan:    Colonoscopy  Patient is appropriate for endoscopic procedure(s) in the ambulatory ([facility]) setting.  The nature of the procedure, as well as the risks, benefits, and alternatives were carefully and thoroughly reviewed with the patient. Ample time for discussion and questions allowed. The patient understood, was satisfied, and agreed to proceed.     HPI: Dana Nielsen is a 78 y.o. female who presents for colonoscopy for evalaution of +Cologuard and Colon Cancer screening.  No active GI symptoms.  Patient is otherwise without complaints or active issues today.  Follows in the Hematology Clinic for IDA. Most recent CBC with stable H/H at 11/34.3 and ferritin normal at 84.   Hospital admission 02/2019 with upper GI bleed 2/2 actively bleeding duodenal Dieulafoy lesion.     - 03/08/2019: Inpatient push enteroscopy: 2 cm HH, 10 mm cratered gastric antral ulcer (clips x2), moderate gastritis with erosions (biopsies negative for H. pylori), residual food in stomach, active oozing Dieulafoy lesion in third portion of duodenum (clips x1; tattoo placed 1-2 cm distal to the lesion for future identification).  Was treated with IV iron  infusion, high-dose PPI, sucralfate  and discharged the following day. - 05/15/2021: EGD: Normal esophagus, gastric polyps, otherwise normal stomach/duodenum - 09/19/2021: Small bowel enteroscopy: 5 cm HH, benign gastric polyps, blood in the third/fourth portion of the duodenum. - 06/02/2022: VCE: faint red area near the tattoo site (from the prior dieulafoy lesion endoscopic intervention/clip placement) possibly representing a small nonbleeding AVM or local inflammation. No active bleeding or stigmata of bleeding was noted. tattoo was seen in the 3rd portion of the duodenum. The tattoo site appeared normal. - A dieulafoy  lesion with bleeding in the distal duodenum, a few cms distal to prior tattoo. Treated with argon plasma coagulation (APC). Clips were placed.  Tattoo placed in the distal aspect reached in the jejunum  Past Medical History:  Diagnosis Date   Allergy    Cataract    Endometrial mass    Family history of colon cancer 12/09/2020   Family history of lung cancer 12/09/2020   Family history of multiple myeloma 12/09/2020   Full dentures    GERD (gastroesophageal reflux disease)    H/O: upper GI bleed    las time 09-19-2021  s/p EGD with bleeding Dielufoy lesion of distal duodedum tx'd APC and clips;  previous gi bleed 03-08-2019  s/p EGD w/ hemastatis and clips   Heart murmur    History of gastric ulcer    w/ upper GI bleed 03-08-2019 gastric ulcer nonbleeding and duodunal bleeding lesion   HLD (hyperlipidemia)    Hypertension    Hypothyroidism    followed by pcp   Iron  deficiency anemia    hematologist--- dr jinny. federico;   due to hx GI bleed and blood product refusal   MDD (major depressive disorder)    OA (osteoarthritis)    hands, knees   Ovarian cyst, right    Refusal of blood transfusions as patient is Jehovah's Witness    Type 2 diabetes mellitus (HCC)    followed by pcp  (12-29-2021  per pt check blood sugar daily in am fasting, average 105--110)   Wears hearing aid in both ears     Past Surgical History:  Procedure Laterality Date   BIOPSY  03/08/2019   Procedure: BIOPSY;  Surgeon: San Sandor GAILS, DO;  Location: [facility];  Service: Gastroenterology;;   CATARACT EXTRACTION Left 05/2022   COLONOSCOPY  2019   x2 [place] and in [place]   DILATATION & CURETTAGE/HYSTEROSCOPY WITH MYOSURE N/A 12/30/2021   Procedure: DILATATION & CURETTAGE/HYSTEROSCOPY WITH MYOSURE;  Surgeon: Storm Setter, DO;  Location: [facility];  Service: Gynecology;  Laterality: N/A;   DILATION AND CURETTAGE OF UTERUS     yrs ago   ENTEROSCOPY N/A 03/08/2019    Procedure: ENTEROSCOPY;  Surgeon: San Sandor GAILS, DO;  Location: [facility];  Service: Gastroenterology;  Laterality: N/A;   ENTEROSCOPY N/A 09/19/2021   Procedure: ENTEROSCOPY;  Surgeon: Leigh Elspeth SQUIBB, MD;  Location: [facility];  Service: Gastroenterology;  Laterality: N/A;   HEMOSTASIS CLIP PLACEMENT  03/08/2019   Procedure: HEMOSTASIS CLIP PLACEMENT;  Surgeon: San Sandor GAILS, DO;  Location: [facility];  Service: Gastroenterology;;   HEMOSTASIS CLIP PLACEMENT  09/19/2021   Procedure: HEMOSTASIS CLIP PLACEMENT;  Surgeon: Leigh Elspeth SQUIBB, MD;  Location: [facility];  Service: Gastroenterology;;   HOT HEMOSTASIS N/A 09/19/2021   Procedure: HOT HEMOSTASIS (ARGON PLASMA COAGULATION/BICAP);  Surgeon: Leigh Elspeth SQUIBB, MD;  Location: [facility];  Service: Gastroenterology;  Laterality: N/A;   KNEE ARTHROSCOPY Left 2003   LAPAROSCOPIC BILATERAL SALPINGO OOPHERECTOMY Bilateral 12/30/2021   Procedure: LAPAROSCOPIC BILATERAL SALPINGO OOPHORECTOMY;  Surgeon: Storm Setter, DO;  Location: [facility];  Service: Gynecology;  Laterality: Bilateral;   SUBMUCOSAL TATTOO INJECTION  03/08/2019   Procedure: SUBMUCOSAL TATTOO INJECTION;  Surgeon: San Sandor GAILS, DO;  Location: [facility];  Service: Gastroenterology;;   SUBMUCOSAL TATTOO INJECTION  09/19/2021   Procedure: SUBMUCOSAL TATTOO INJECTION;  Surgeon: Leigh Elspeth SQUIBB, MD;  Location: [facility];  Service: Gastroenterology;;   TONSILLECTOMY     child   TUBAL LIGATION Bilateral 1990   WISDOM TOOTH EXTRACTION     WRIST FRACTURE SURGERY Right 2013   per pt for crush injury,  no hardware    Prior to Admission medications   Medication Sig Start Date End Date Taking? Authorizing Provider  AMBULATORY NON FORMULARY MEDICATION Take 2 capsules by mouth daily. Medication Name: FLORADIX (iron  supplement)    [provider]  finasteride (PROPECIA) 1 MG tablet Take by mouth. 01/17/23   [provider]  glucose blood (ACCU-CHEK AVIVA PLUS) test strip Use to test your blood sugar In Vitro twice a day    [provider]  losartan  (COZAAR ) 25 MG tablet Take 25 mg by mouth in the morning.    [provider]  losartan -hydrochlorothiazide (HYZAAR) 50-12.5 MG tablet Take 1 tablet by mouth daily. 10/06/22   [provider]  MAGNESIUM -OXIDE 400 (240 Mg) MG tablet Take 1 tablet by mouth once daily 06/22/23   Dorsey, John T IV, MD  metFORMIN (GLUCOPHAGE) 1000 MG tablet Take 1,000 mg by mouth 2 (two) times daily with a meal.    [provider]  Multiple Vitamins-Minerals (HAIR/SKIN/NAILS) CAPS Take 1 capsule by mouth in the morning.    [provider]  Multiple Vitamins-Minerals (ONE-A-DAY WOMENS 50+) TABS Take 1 tablet by mouth in the morning.    [provider]  omeprazole (PRILOSEC) 40 MG capsule Take 40 mg by mouth in the morning. 09/16/21   [provider]  OVER THE COUNTER MEDICATION Pt taking oral Monoxidil    [provider]  Semaglutide (OZEMPIC, 0.25 OR 0.5 MG/DOSE, [place]) Inject 0.5 mg into the skin once a week.    [provider]  simvastatin  (  ZOCOR ) 40 MG tablet Take 1 tablet (40 mg total) by mouth daily at 6 PM. Patient taking differently: Take 40 mg by mouth at bedtime. 04/06/19   Berneta Elsie Sayre, MD  SYNTHROID  100 MCG tablet Take 100 mcg by mouth daily before breakfast.    [provider]  traZODone  (DESYREL ) 100 MG tablet TAKE 1 TABLET BY MOUTH AT BEDTIME Patient taking differently: Take 100 mg by mouth at bedtime. 09/20/19   Berneta Elsie Sayre, MD  Vitamin D, Ergocalciferol, (DRISDOL) 1.25 MG (50000 UNIT) CAPS capsule Take 50,000 Units by mouth every 7 (seven) days. 04/27/22   [provider]    Current Outpatient Medications  Medication Sig Dispense Refill   AMBULATORY NON FORMULARY MEDICATION Take 2 capsules by mouth daily. Medication Name: FLORADIX (iron  supplement)      finasteride (PROPECIA) 1 MG tablet Take by mouth.     glucose blood (ACCU-CHEK AVIVA PLUS) test strip Use to test your blood sugar In Vitro twice a day     losartan  (COZAAR ) 25 MG tablet Take 25 mg by mouth in the morning.     losartan -hydrochlorothiazide (HYZAAR) 50-12.5 MG tablet Take 1 tablet by mouth daily.     MAGNESIUM -OXIDE 400 (240 Mg) MG tablet Take 1 tablet by mouth once daily 30 tablet 0   metFORMIN (GLUCOPHAGE) 1000 MG tablet Take 1,000 mg by mouth 2 (two) times daily with a meal.     Multiple Vitamins-Minerals (HAIR/SKIN/NAILS) CAPS Take 1 capsule by mouth in the morning.     Multiple Vitamins-Minerals (ONE-A-DAY WOMENS 50+) TABS Take 1 tablet by mouth in the morning.     omeprazole (PRILOSEC) 40 MG capsule Take 40 mg by mouth in the morning.     OVER THE COUNTER MEDICATION Pt taking oral Monoxidil     Semaglutide (OZEMPIC, 0.25 OR 0.5 MG/DOSE, [place]) Inject 0.5 mg into the skin once a week.     simvastatin  (ZOCOR ) 40 MG tablet Take 1 tablet (40 mg total) by mouth daily at 6 PM. (Patient taking differently: Take 40 mg by mouth at bedtime.) 90 tablet 3   SYNTHROID  100 MCG tablet Take 100 mcg by mouth daily before breakfast.     traZODone  (DESYREL ) 100 MG tablet TAKE 1 TABLET BY MOUTH AT BEDTIME (Patient taking differently: Take 100 mg by mouth at bedtime.) 90 tablet 0   Vitamin D, Ergocalciferol, (DRISDOL) 1.25 MG (50000 UNIT) CAPS capsule Take 50,000 Units by mouth every 7 (seven) days.     Current Facility-Administered Medications  Medication Dose Route Frequency Provider Last Rate Last Admin   0.9 %  sodium chloride  infusion  500 mL Intravenous Continuous Alyjah Lovingood V, DO        Allergies as of 07/06/2023 - Review Complete 07/06/2023  Allergen Reaction Noted   Lidocaine  Shortness Of Breath 12/14/2018   Macrobid  [nitrofurantoin ] Other (See Comments) 04/06/2021   Minoxidil Dermatitis and Other (See Comments) 06/15/2023    Family History  Problem Relation Age of Onset    Multiple myeloma Mother 44   Heart attack Father    Lung cancer Sister 13       smoking hx   Leukemia Paternal Aunt        dx before 41   Cancer Paternal Aunt        unknown type; dx after 50   Colon cancer Paternal Aunt        dx after 50   Head & neck cancer Paternal Uncle  dx before 65   Cancer Paternal Uncle        unknown type; dx after 50   Cancer Paternal Uncle        color or gastric; dx after 50   Esophageal cancer Neg Hx    Stomach cancer Neg Hx    Rectal cancer Neg Hx     Social History   Socioeconomic History   Marital status: Widowed    Spouse name: Not on file   Number of children: 9   Years of education: Not on file   Highest education level: Not on file  Occupational History   Not on file  Tobacco Use   Smoking status: Never   Smokeless tobacco: Never  Vaping Use   Vaping status: Never Used  Substance and Sexual Activity   Alcohol use: Yes    Comment: maybe once a month   Drug use: Never   Sexual activity: Not on file  Other Topics Concern   Not on file  Social History Narrative   Not on file   Social Drivers of Health   Financial Resource Strain: Low Risk  (01/26/2023)   Received from [facility]   Overall Financial Resource Strain (CARDIA)    Difficulty of Paying Living Expenses: Not hard at all  Food Insecurity: No Food Insecurity (01/26/2023)   Received from [facility]   Hunger Vital Sign    Within the past 12 months, you worried that your food would run out before you got the money to buy more.: Never true    Within the past 12 months, the food you bought just didn't last and you didn't have money to get more.: Never true  Transportation Needs: No Transportation Needs (01/26/2023)   Received from [facility] - Transportation    Lack of Transportation (Medical): No    Lack of Transportation (Non-Medical): No  Physical Activity: Unknown (07/20/2022)   Received from [facility]   Exercise Vital Sign    On average,  how many days per week do you engage in moderate to strenuous exercise (like a brisk walk)?: 0 days    Minutes of Exercise per Session: Not on file  Stress: Stress Concern Present (07/20/2022)   Received from [facility] - Occupational Stress Questionnaire    Feeling of Stress : Very much  Social Connections: Socially Integrated (07/20/2022)   Received from [facility]   Social Network    How would you rate your social network (family, work, friends)?: Good participation with social networks  Intimate Partner Violence: Not At Risk (07/20/2022)   Received from [facility]   HITS    Over the last 12 months how often did your partner physically hurt you?: Never    Over the last 12 months how often did your partner insult you or talk down to you?: Never    Over the last 12 months how often did your partner threaten you with physical harm?: Never    Over the last 12 months how often did your partner scream or curse at you?: Never    Physical Exam: Vital signs in last 24 hours: @BP  120/73   Pulse 96   Temp 97.7 F (36.5 C) (Temporal)   Ht 5' 2 (1.575 m)   Wt 190 lb (86.2 kg)   SpO2 98%   BMI 34.75 kg/m  GEN: NAD EYE: Sclerae anicteric Nielsen: MMM CV: Non-tachycardic Pulm: CTA b/l GI: Soft, NT/ND NEURO:  Alert & Oriented x 3   Sandor Flatter, DO [facility]   07/06/2023 2:18 PM

## 2023-07-07 ENCOUNTER — Telehealth: Payer: Self-pay | Admitting: *Deleted

## 2023-07-07 NOTE — Telephone Encounter (Signed)
 Attempted post procedure follow up call.  No answer - LVM.

## 2023-07-11 ENCOUNTER — Inpatient Hospital Stay

## 2023-07-11 LAB — SURGICAL PATHOLOGY

## 2023-07-18 ENCOUNTER — Inpatient Hospital Stay: Payer: No Typology Code available for payment source | Attending: Hematology and Oncology

## 2023-07-18 ENCOUNTER — Other Ambulatory Visit: Payer: Self-pay | Admitting: Hematology and Oncology

## 2023-07-18 ENCOUNTER — Inpatient Hospital Stay (HOSPITAL_BASED_OUTPATIENT_CLINIC_OR_DEPARTMENT_OTHER): Payer: No Typology Code available for payment source | Admitting: Hematology and Oncology

## 2023-07-18 ENCOUNTER — Ambulatory Visit: Payer: Self-pay | Admitting: Gastroenterology

## 2023-07-18 VITALS — BP 145/89 | HR 95 | Temp 97.3°F | Resp 16 | Wt 193.0 lb

## 2023-07-18 DIAGNOSIS — Z801 Family history of malignant neoplasm of trachea, bronchus and lung: Secondary | ICD-10-CM | POA: Diagnosis not present

## 2023-07-18 DIAGNOSIS — Z800 Family history of malignant neoplasm of digestive organs: Secondary | ICD-10-CM | POA: Diagnosis not present

## 2023-07-18 DIAGNOSIS — Z806 Family history of leukemia: Secondary | ICD-10-CM | POA: Insufficient documentation

## 2023-07-18 DIAGNOSIS — D500 Iron deficiency anemia secondary to blood loss (chronic): Secondary | ICD-10-CM

## 2023-07-18 DIAGNOSIS — Z807 Family history of other malignant neoplasms of lymphoid, hematopoietic and related tissues: Secondary | ICD-10-CM | POA: Insufficient documentation

## 2023-07-18 LAB — CBC WITH DIFFERENTIAL (CANCER CENTER ONLY)
Abs Immature Granulocytes: 0.01 K/uL (ref 0.00–0.07)
Basophils Absolute: 0.1 K/uL (ref 0.0–0.1)
Basophils Relative: 1 %
Eosinophils Absolute: 0.2 K/uL (ref 0.0–0.5)
Eosinophils Relative: 3 %
HCT: 34 % — ABNORMAL LOW (ref 36.0–46.0)
Hemoglobin: 10.8 g/dL — ABNORMAL LOW (ref 12.0–15.0)
Immature Granulocytes: 0 %
Lymphocytes Relative: 27 %
Lymphs Abs: 2 K/uL (ref 0.7–4.0)
MCH: 26 pg (ref 26.0–34.0)
MCHC: 31.8 g/dL (ref 30.0–36.0)
MCV: 81.7 fL (ref 80.0–100.0)
Monocytes Absolute: 0.7 K/uL (ref 0.1–1.0)
Monocytes Relative: 9 %
Neutro Abs: 4.4 K/uL (ref 1.7–7.7)
Neutrophils Relative %: 60 %
Platelet Count: 248 K/uL (ref 150–400)
RBC: 4.16 MIL/uL (ref 3.87–5.11)
RDW: 16.6 % — ABNORMAL HIGH (ref 11.5–15.5)
WBC Count: 7.3 K/uL (ref 4.0–10.5)
nRBC: 0 % (ref 0.0–0.2)

## 2023-07-18 LAB — RETIC PANEL
Immature Retic Fract: 21.9 % — ABNORMAL HIGH (ref 2.3–15.9)
RBC.: 4.17 MIL/uL (ref 3.87–5.11)
Retic Count, Absolute: 74.2 K/uL (ref 19.0–186.0)
Retic Ct Pct: 1.8 % (ref 0.4–3.1)
Reticulocyte Hemoglobin: 29.4 pg (ref >27.9)

## 2023-07-18 LAB — CMP (CANCER CENTER ONLY)
ALT: 19 U/L (ref 0–44)
AST: 19 U/L (ref 15–41)
Albumin: 4.1 g/dL (ref 3.5–5.0)
Alkaline Phosphatase: 42 U/L (ref 38–126)
Anion gap: 10 (ref 5–15)
BUN: 17 mg/dL (ref 8–23)
CO2: 28 mmol/L (ref 22–32)
Calcium: 9.9 mg/dL (ref 8.9–10.3)
Chloride: 104 mmol/L (ref 98–111)
Creatinine: 0.85 mg/dL (ref 0.44–1.00)
GFR, Estimated: >60 mL/min (ref >60)
Glucose, Bld: 133 mg/dL — ABNORMAL HIGH (ref 70–99)
Potassium: 4.1 mmol/L (ref 3.5–5.1)
Sodium: 142 mmol/L (ref 135–145)
Total Bilirubin: 0.4 mg/dL (ref 0.0–1.2)
Total Protein: 6.9 g/dL (ref 6.5–8.1)

## 2023-07-18 LAB — IRON AND IRON BINDING CAPACITY (CC-WL,HP ONLY)
Iron: 51 ug/dL (ref 28–170)
Saturation Ratios: 11 % (ref 10.4–31.8)
TIBC: 447 ug/dL (ref 250–450)
UIBC: 396 ug/dL (ref 148–442)

## 2023-07-18 NOTE — Progress Notes (Unsigned)
 [facility] [phone]   [phone]  PROGRESS NOTE  Patient Care Team: Pura Lenis, MD as PCP - General (Family Medicine)  Hematological/Oncological History #Iron  Deficiency Anemia 2/2 to GI Bleeding 1) 2019: presented to [facility] in [facility] with symptoms of anemia. Found to have Hgb 5.0. EGD/colon/capsule reported found no source of bleed 2) 01/18/2019: establish care with Dr. Federico  3) 03/08/2019: admitted for acute GI bleed. Endoscopy revealed an actively bleeding Dieulafoy lesion that was clipped. Received IV feraheme  510mg  x 1 dose during admission 4) 03/15/2019: received dose 2 of IV feraheme  510mg  in outpatient setting. 5) 04/13/2019: WBC 8.6, Hgb 12.2, MCV 92.1, Plt 216 6) 07/19/2019: WBC 7.1, Hgb 12.2, MCV 90.6, Plt 226 7) 09/20/2021-09/21/2021: IV Ferrleciti 250 mg x 2 doses 8) 10/15/2021-1012/2023: IV venofer  200 mg x 2 doses 9) 08/02/2022: WBC 6.6, Hgb 12.8, MCV 86.3, Plt 235  Interval History:  Dana Nielsen 78 y.o. female with medical history significant for Dieulafoy lesion bleeding with iron  deficiency anemia who presents for a follow up visit. The patient's last visit was on 01/31/2023. In the interim since the last visit she reports she has had no major changes in her health.  On exam today Dana Nielsen reports she feels like she has gotten old weekly.  She reports that she has low energy and is not able to do much with activity that she would like.  She reports a other day she was in bed till 3 PM and then went back to bed at 8 PM.  She reports that she had a positive Cologuard test and underwent a colonoscopy recently with Dr. San and no clear source of bleeding was found.  She notes that she also had to hold her Floradix medication for 2 weeks prior to the colonoscopy.  She notes that she is not having any trouble with bruising or bleeding.  She notes that she is not having any blood in the urine or stool.  She notes that she is  doing her best to try to eat at least 2 meals per day.  She does enjoy eating protein and is trying to eat more steak and liver.  She notes that she otherwise has had no major changes in her health.  She denies any fevers, chills, sweats.  A full 10 point ROS is otherwise negative.  MEDICAL HISTORY:  Past Medical History:  Diagnosis Date   Allergy    Cataract    Endometrial mass    Family history of colon cancer 12/09/2020   Family history of lung cancer 12/09/2020   Family history of multiple myeloma 12/09/2020   Full dentures    GERD (gastroesophageal reflux disease)    H/O: upper GI bleed    las time 09-19-2021  s/p EGD with bleeding Dielufoy lesion of distal duodedum tx'd APC and clips;  previous gi bleed 03-08-2019  s/p EGD w/ hemastatis and clips   Heart murmur    History of gastric ulcer    w/ upper GI bleed 03-08-2019 gastric ulcer nonbleeding and duodunal bleeding lesion   HLD (hyperlipidemia)    Hypertension    Hypothyroidism    followed by pcp   Iron  deficiency anemia    hematologist--- dr j. federico;   due to hx GI bleed and blood product refusal   MDD (major depressive disorder)    OA (osteoarthritis)    hands, knees   Ovarian cyst, right    Refusal of blood transfusions as patient is  Jehovah's Witness    Type 2 diabetes mellitus (HCC)    followed by pcp  (12-29-2021  per pt check blood sugar daily in am fasting, average 105--110)   Wears hearing aid in both ears     SURGICAL HISTORY: Past Surgical History:  Procedure Laterality Date   BIOPSY  03/08/2019   Procedure: BIOPSY;  Surgeon: San Sandor GAILS, DO;  Location: [facility];  Service: Gastroenterology;;   CATARACT EXTRACTION Left 05/2022   [place]  [place]   DILATATION & CURETTAGE/HYSTEROSCOPY WITH MYOSURE N/A 12/30/2021   Procedure: DILATATION & CURETTAGE/HYSTEROSCOPY WITH MYOSURE;  Surgeon: Storm Setter, DO;  Location: [facility];  Service:  Gynecology;  Laterality: N/A;   DILATION AND CURETTAGE OF UTERUS     yrs ago   ENTEROSCOPY N/A 03/08/2019   Procedure: ENTEROSCOPY;  Surgeon: San Sandor GAILS, DO;  Location: [facility];  Service: Gastroenterology;  Laterality: N/A;   ENTEROSCOPY N/A 09/19/2021   Procedure: ENTEROSCOPY;  Surgeon: Leigh Elspeth SQUIBB, MD;  Location: [facility];  Service: Gastroenterology;  Laterality: N/A;   HEMOSTASIS CLIP PLACEMENT  03/08/2019   Procedure: HEMOSTASIS CLIP PLACEMENT;  Surgeon: San Sandor GAILS, DO;  Location: [facility];  Service: Gastroenterology;;   HEMOSTASIS CLIP PLACEMENT  09/19/2021   Procedure: HEMOSTASIS CLIP PLACEMENT;  Surgeon: Leigh Elspeth SQUIBB, MD;  Location: [facility];  Service: Gastroenterology;;   HOT HEMOSTASIS N/A 09/19/2021   Procedure: HOT HEMOSTASIS (ARGON PLASMA COAGULATION/BICAP);  Surgeon: Leigh Elspeth SQUIBB, MD;  Location: [facility];  Service: Gastroenterology;  Laterality: N/A;   KNEE ARTHROSCOPY Left 2003   LAPAROSCOPIC BILATERAL SALPINGO OOPHERECTOMY Bilateral 12/30/2021   Procedure: LAPAROSCOPIC BILATERAL SALPINGO OOPHORECTOMY;  Surgeon: Storm Setter, DO;  Location: [facility];  Service: Gynecology;  Laterality: Bilateral;   SUBMUCOSAL TATTOO INJECTION  03/08/2019   Procedure: SUBMUCOSAL TATTOO INJECTION;  Surgeon: San Sandor GAILS, DO;  Location: [facility];  Service: Gastroenterology;;   SUBMUCOSAL TATTOO INJECTION  09/19/2021   Procedure: SUBMUCOSAL TATTOO INJECTION;  Surgeon: Leigh Elspeth SQUIBB, MD;  Location: [facility];  Service: Gastroenterology;;   TONSILLECTOMY     child   TUBAL LIGATION Bilateral 1990   WISDOM TOOTH EXTRACTION     WRIST FRACTURE SURGERY Right 2013   per pt for crush injury,  no hardware    SOCIAL HISTORY: Social History   Socioeconomic History   Marital status: Widowed    Spouse name: Not on file   Number of children: 9   Years of education: Not on file   Highest education  level: Not on file  Occupational History   Not on file  Tobacco Use   Smoking status: Never   Smokeless tobacco: Never  Vaping Use   Vaping status: Never Used  Substance and Sexual Activity   Alcohol use: Yes    Comment: maybe once a month   Drug use: Never   Sexual activity: Not on file  Other Topics Concern   Not on file  Social History Narrative   Not on file   Social Drivers of Health   Financial Resource Strain: Low Risk  (01/26/2023)   [facility]   Overall Financial Resource Strain (CARDIA)    Difficulty of Paying Living Expenses: Not hard at all  Food Insecurity: No Food Insecurity (01/26/2023)   [facility]   Hunger Vital Sign    Within the past 12 months, you worried that your food would run  out before you got the money to buy more.: Never true    Within the past 12 months, the food you bought just didn't last and you didn't have money to get more.: Never true  Transportation Needs: No Transportation Needs (01/26/2023)   Received from [facility]   PRAPARE - Transportation    Lack of Transportation (Medical): No    Lack of Transportation (Non-Medical): No  Physical Activity: Unknown (07/20/2022)   Received from [facility]   Exercise Vital Sign    On average, how many days per week do you engage in moderate to strenuous exercise (like a brisk walk)?: 0 days    Minutes of Exercise per Session: Not on file  Stress: Stress Concern Present (07/20/2022)   Received from [facility] of Occupational Health - Occupational Stress Questionnaire    Feeling of Stress : Very much  Social Connections: Socially Integrated (07/20/2022)   Received from [facility]   Social Network    How would you rate your social network (family, work, friends)?: Good participation with social networks  Intimate Partner Violence: Not At Risk (07/20/2022)   Received from [facility]   HITS    Over the last 12 months how often did your  partner physically hurt you?: Never    Over the last 12 months how often did your partner insult you or talk down to you?: Never    Over the last 12 months how often did your partner threaten you with physical harm?: Never    Over the last 12 months how often did your partner scream or curse at you?: Never    FAMILY HISTORY: Family History  Problem Relation Age of Onset   Multiple myeloma Mother 73   Heart attack Father    Lung cancer Sister 56       smoking hx   Leukemia Paternal Aunt        dx before 14   Cancer Paternal Aunt        unknown type; dx after 33   Colon cancer Paternal Aunt        dx after 50   Head & neck cancer Paternal Uncle        dx before 74   Cancer Paternal Uncle        unknown type; dx after 50   Cancer Paternal Uncle        color or gastric; dx after 50   Esophageal cancer Neg Hx    Stomach cancer Neg Hx    Rectal cancer Neg Hx     ALLERGIES:  is allergic to lidocaine , macrobid  [nitrofurantoin ], and minoxidil.  MEDICATIONS:  Current Outpatient Medications  Medication Sig Dispense Refill   AMBULATORY NON FORMULARY MEDICATION Take 2 capsules by mouth daily. Medication Name: FLORADIX (iron  supplement)     cetirizine  (ZYRTEC ) 10 MG tablet Take 10 mg by mouth daily.     finasteride (PROPECIA) 1 MG tablet Take by mouth.     glucose blood (ACCU-CHEK AVIVA PLUS) test strip Use to test your blood sugar In Vitro twice a day     losartan -hydrochlorothiazide (HYZAAR) 50-12.5 MG tablet Take 1 tablet by mouth daily.     MAGNESIUM -OXIDE 400 (240 Mg) MG tablet Take 1 tablet by mouth once daily 30 tablet 0   metFORMIN (GLUCOPHAGE) 1000 MG tablet Take 1,000 mg by mouth 2 (two) times daily with a meal.     Multiple Vitamins-Minerals (HAIR/SKIN/NAILS) CAPS Take 1 capsule by mouth in  the morning.     Multiple Vitamins-Minerals (ONE-A-DAY WOMENS 50+) TABS Take 1 tablet by mouth in the morning.     omeprazole (PRILOSEC) 40 MG capsule Take 40 mg by mouth in the morning.      OVER THE COUNTER MEDICATION Pt taking oral Monoxidil     Semaglutide (OZEMPIC, 0.25 OR 0.5 MG/DOSE, [place]) Inject 0.5 mg into the skin once a week.     simvastatin  (ZOCOR ) 40 MG tablet Take 1 tablet (40 mg total) by mouth daily at 6 PM. (Patient taking differently: Take 40 mg by mouth at bedtime.) 90 tablet 3   SYNTHROID  100 MCG tablet Take 100 mcg by mouth daily before breakfast.     traZODone  (DESYREL ) 100 MG tablet TAKE 1 TABLET BY MOUTH AT BEDTIME (Patient taking differently: Take 100 mg by mouth at bedtime.) 90 tablet 0   Vitamin D, Ergocalciferol, (DRISDOL) 1.25 MG (50000 UNIT) CAPS capsule Take 50,000 Units by mouth every 7 (seven) days.     No current facility-administered medications for this visit.    REVIEW OF SYSTEMS:   Constitutional: ( - ) fevers, ( - )  chills , ( - ) night sweats Eyes: ( - ) blurriness of vision, ( - ) double vision, ( - ) watery eyes Ears, nose, mouth, throat, and face: ( - ) mucositis, ( - ) sore throat Respiratory: ( - ) cough, ( - ) dyspnea, ( - ) wheezes Cardiovascular: ( - ) palpitation, ( - ) chest discomfort, ( - ) lower extremity swelling Gastrointestinal:  ( - ) nausea, ( - ) heartburn, ( - ) change in bowel habits Skin: ( - ) abnormal skin rashes Lymphatics: ( - ) new lymphadenopathy, ( - ) easy bruising Neurological: ( - ) numbness, ( - ) tingling, ( - ) new weaknesses Behavioral/Psych: ( - ) mood change, ( - ) new changes  All other systems were reviewed with the patient and are negative.  PHYSICAL EXAMINATION: ECOG PERFORMANCE STATUS: 1 - Symptomatic but completely ambulatory  Vitals:   07/18/23 1522  BP: (!) 145/89  Pulse: 95  Resp: 16  Temp: (!) 97.3 F (36.3 C)  SpO2: 100%      Filed Weights   07/18/23 1522  Weight: 193 lb (87.5 kg)       GENERAL:well appearing elderly Caucasian female alert, no distress and comfortable SKIN: skin color, texture, turgor are normal, no rashes or significant lesions EYES: conjunctiva are  pink and non-injected, sclera clear LUNGS: clear to auscultation and percussion with normal breathing effort HEART: regular rate & rhythm and no murmurs and no lower extremity edema Musculoskeletal: no cyanosis of digits and no clubbing  PSYCH: alert & oriented x 3, fluent speech NEURO: no focal motor/sensory deficits  LABORATORY DATA:  I have reviewed the data as listed    Latest Ref Rng & Units 07/18/2023    2:55 PM 06/20/2023    3:35 PM 05/30/2023    3:45 PM  CBC  WBC 4.0 - 10.5 K/uL 7.3  6.6  9.1   Hemoglobin 12.0 - 15.0 g/dL 89.1  88.9  88.7   Hematocrit 36.0 - 46.0 % 34.0  34.3  34.6   Platelets 150 - 400 K/uL 248  283  207        Latest Ref Rng & Units 07/18/2023    2:55 PM 04/04/2023    3:22 PM 02/28/2023    3:10 PM  CMP  Glucose 70 - 99 mg/dL 866  885  132   BUN 8 - 23 mg/dL 17  17  21    Creatinine 0.44 - 1.00 mg/dL 9.14  9.16  9.24   Sodium 135 - 145 mmol/L 142  139  139   Potassium 3.5 - 5.1 mmol/L 4.1  3.5  4.5   Chloride 98 - 111 mmol/L 104  104  104   CO2 22 - 32 mmol/L 28  27  28    Calcium 8.9 - 10.3 mg/dL 9.9  89.8  89.4   Total Protein 6.5 - 8.1 g/dL 6.9  8.0  7.2   Total Bilirubin 0.0 - 1.2 mg/dL 0.4  0.4  0.3   Alkaline Phos 38 - 126 U/L 42  51  50   AST 15 - 41 U/L 19  18  16    ALT 0 - 44 U/L 19  18  17        RADIOGRAPHIC STUDIES: No results found.  ASSESSMENT & PLAN Demeisha Geraghty 78 y.o. female with medical history significant for Dieulafoy lesion bleeding with iron  deficiency anemia who presents for a follow up visit.   #Iron  Deficiency Anemia 2/2 to GI Bleeding --no evidence of bleeding on today's exam.  --most recent small bowel endoscopy from 09/19/2021 showed dieulafoy lesion with bleeding in the distal duodenum, a few cms distal to prior tattoo.Treated with argon plasma coagulation --last received IV venofer  200 mg x 2 doses on 10/15/2021 and 10/22/2021.  --unable to tolerate PO iron  due to chronic constipation.  --labs today shows white  blood cell 7.3, hemoglobin 10.8, MCV 81.7, platelets 248 --awaiting final results of iron  panel to determine if IV iron  is required.  --RTC q 3 weeks for labs and 12 weeks for clinic visit     # Hypomagnesemia -- Patient has low levels of magnesium , unclear etiology.  May be secondary to diarrhea bouts --Patient takes 400 mg p.o. of magnesium  daily.   --magnesium  levels today pending. Last 1.5 on 04/14/2022.  --recommend to continue magnesium  supplement.   #Jehovah's Witness --the patient is not to receive blood products under any circumstances, per her request. We have copied and uploaded her Blood Contract which she carries into her wallet into our system. --in the event the patient is admitted with severe anemia or bleed please make the Hematology service aware so that we may provide bloodless options for support.    No orders of the defined types were placed in this encounter.  All questions were answered. The patient knows to call the clinic with any problems, questions or concerns.  I have spent a total of 30 minutes minutes of face-to-face and non-face-to-face time, preparing to see the patient, performing a medically appropriate examination, counseling and educating the patient, ordering medications, documenting clinical information in the electronic health record, and care coordination.   Norleen IVAR Kidney, MD Department of Hematology/Oncology [facility] Phone: [phone] Pager: [phone] Email: [email or web address]  07/19/2023 9:07 AM

## 2023-07-19 ENCOUNTER — Encounter: Payer: Self-pay | Admitting: Physician Assistant

## 2023-07-19 LAB — FERRITIN: Ferritin: 58 ng/mL (ref 11–307)

## 2023-07-27 ENCOUNTER — Telehealth: Payer: Self-pay | Admitting: *Deleted

## 2023-07-27 NOTE — Telephone Encounter (Signed)
 Received vm message from pt regarding her anemia. She is taking 2 doses daily of her iron  for the last week and had her CBC checked at her PCP office and it has not changed much.  TCT pt and spoke with her. Advised that it can take up to 3 months to boost up iron  stores in your. Body. Advised that her stored iron -Ferritin-is good @ 58. She is asking if she needs another endoscopy. Advised that I will discuss with Dr. Federico in the morning and then call her back.  She voiced understanding.

## 2023-08-02 ENCOUNTER — Other Ambulatory Visit: Payer: Self-pay | Admitting: Family Medicine

## 2023-08-02 DIAGNOSIS — Z0000 Encounter for general adult medical examination without abnormal findings: Secondary | ICD-10-CM

## 2023-08-08 ENCOUNTER — Other Ambulatory Visit: Payer: Self-pay | Admitting: Hematology and Oncology

## 2023-08-08 ENCOUNTER — Inpatient Hospital Stay

## 2023-08-08 DIAGNOSIS — D500 Iron deficiency anemia secondary to blood loss (chronic): Secondary | ICD-10-CM | POA: Diagnosis not present

## 2023-08-08 LAB — CMP (CANCER CENTER ONLY)
ALT: 25 U/L (ref 0–44)
AST: 25 U/L (ref 15–41)
Albumin: 4.5 g/dL (ref 3.5–5.0)
Alkaline Phosphatase: 43 U/L (ref 38–126)
Anion gap: 12 (ref 5–15)
BUN: 27 mg/dL — ABNORMAL HIGH (ref 8–23)
CO2: 24 mmol/L (ref 22–32)
Calcium: 10.3 mg/dL (ref 8.9–10.3)
Chloride: 103 mmol/L (ref 98–111)
Creatinine: 0.97 mg/dL (ref 0.44–1.00)
GFR, Estimated: 60 mL/min — ABNORMAL LOW (ref >60)
Glucose, Bld: 121 mg/dL — ABNORMAL HIGH (ref 70–99)
Potassium: 4.1 mmol/L (ref 3.5–5.1)
Sodium: 139 mmol/L (ref 135–145)
Total Bilirubin: 0.4 mg/dL (ref 0.0–1.2)
Total Protein: 7.7 g/dL (ref 6.5–8.1)

## 2023-08-08 LAB — CBC WITH DIFFERENTIAL (CANCER CENTER ONLY)
Abs Immature Granulocytes: 0.03 K/uL (ref 0.00–0.07)
Basophils Absolute: 0.1 K/uL (ref 0.0–0.1)
Basophils Relative: 1 %
Eosinophils Absolute: 0.2 K/uL (ref 0.0–0.5)
Eosinophils Relative: 2 %
HCT: 36.8 % (ref 36.0–46.0)
Hemoglobin: 11.9 g/dL — ABNORMAL LOW (ref 12.0–15.0)
Immature Granulocytes: 0 %
Lymphocytes Relative: 29 %
Lymphs Abs: 2.3 K/uL (ref 0.7–4.0)
MCH: 26.5 pg (ref 26.0–34.0)
MCHC: 32.3 g/dL (ref 30.0–36.0)
MCV: 82 fL (ref 80.0–100.0)
Monocytes Absolute: 0.6 K/uL (ref 0.1–1.0)
Monocytes Relative: 8 %
Neutro Abs: 4.7 K/uL (ref 1.7–7.7)
Neutrophils Relative %: 60 %
Platelet Count: 256 K/uL (ref 150–400)
RBC: 4.49 MIL/uL (ref 3.87–5.11)
RDW: 17.2 % — ABNORMAL HIGH (ref 11.5–15.5)
WBC Count: 7.9 K/uL (ref 4.0–10.5)
nRBC: 0 % (ref 0.0–0.2)

## 2023-08-08 LAB — IRON AND IRON BINDING CAPACITY (CC-WL,HP ONLY)
Iron: 38 ug/dL (ref 28–170)
Saturation Ratios: 8 % — ABNORMAL LOW (ref 10.4–31.8)
TIBC: 476 ug/dL — ABNORMAL HIGH (ref 250–450)
UIBC: 438 ug/dL (ref 148–442)

## 2023-08-08 LAB — RETIC PANEL
Immature Retic Fract: 16.9 % — ABNORMAL HIGH (ref 2.3–15.9)
RBC.: 4.57 MIL/uL (ref 3.87–5.11)
Retic Count, Absolute: 66.7 K/uL (ref 19.0–186.0)
Retic Ct Pct: 1.5 % (ref 0.4–3.1)
Reticulocyte Hemoglobin: 31.3 pg (ref >27.9)

## 2023-08-09 ENCOUNTER — Telehealth: Payer: Self-pay | Admitting: *Deleted

## 2023-08-09 LAB — FERRITIN: Ferritin: 66 ng/mL (ref 11–307)

## 2023-08-09 NOTE — Telephone Encounter (Signed)
 Received call from pt regarding lab results from yesterday. Reviewed them with her. Her iron  saturation is down to 8, otherwise her labs look better. Pt states she is very fatigued and has trouble even get out of bed. Advised that I would discuss with Dr. Federico and call her back with his recommendations either today or tomorrow. Pt voiced understanding.

## 2023-08-12 ENCOUNTER — Telehealth: Payer: Self-pay | Admitting: *Deleted

## 2023-08-12 NOTE — Telephone Encounter (Signed)
 Received call from pt stating that she is still feeling very, very fatigued and wondering if she would be getting any IV iron . Advise dthta I was waiting to hear back from Dr. Federico about the IV iron . And that I would call her back on 08/15/23. Re-emphasized that that her HGB was better this time than the time before and her Ferritin levels were very good. He iron  saturation was low but that I needed Dr. Lafonda input. Pt voiced understanding.

## 2023-08-16 ENCOUNTER — Ambulatory Visit
Admission: RE | Admit: 2023-08-16 | Discharge: 2023-08-16 | Disposition: A | Discharge status: Home / Self Care | Source: Ambulatory Visit | Attending: Family Medicine | Admitting: Family Medicine

## 2023-08-16 DIAGNOSIS — Z0000 Encounter for general adult medical examination without abnormal findings: Secondary | ICD-10-CM

## 2023-08-20 ENCOUNTER — Other Ambulatory Visit: Payer: Self-pay | Admitting: Hematology and Oncology

## 2023-08-21 ENCOUNTER — Encounter: Payer: Self-pay | Admitting: Physician Assistant

## 2023-08-23 NOTE — Progress Notes (Signed)
 08/23/2023  [facility]   Patient ID:  Dana Nielsen is a 78 y.o. (DOB 1945-03-13) female.  Assessment and Plan  Dana Nielsen was seen today for mobility.  Diagnoses and all orders for this visit:  Iron  deficiency anemia due to chronic blood loss -     CBC And Differential; Future -     Iron  and Total Iron -Binding Capacity (TIBC); Future -     Iron  and Total Iron -Binding Capacity (TIBC) -     CBC And Differential  Primary osteoarthritis of both knees  Mobility impaired Comments: Uses a cane and walker in the house.  Mostly homebound due to knee pain   Assessment & Plan 1. Knee and hip pain. - Reports severe limitations in mobility due to knee and hip pain, including a history of arthroscopic surgery on the left knee and bursitis in the hip. - Uses a cane and a walker for support and has undergone physical therapy. - Not a candidate for knee replacement surgery due to a bleeding issue. - Prescription for a scooter will be provided once she communicates the specific model she intends to purchase; she will need to contact her insurance company for approval.  2. Iron  deficiency anemia. - History of a slow bleed in the small intestine, which has been sealed three times. - Hemoglobin levels fluctuate; currently taking Fordix. Iron  pills cause constipation, which could exacerbate her bleeding condition. - Blood work will be ordered today to check iron  levels and CBC. - If iron  levels are low, an iron  infusion may be considered.  3. Elevated blood pressure. - Blood pressure was slightly elevated at 138/70 mmHg. - Had not taken her medication prior to the visit. - Will monitor blood pressure and ensure medication adherence.  No follow-ups on file.   Health Maintenance Due  Topic Date Due  . Pneumococcal Vaccine: 50+ Years (1 of 2 - PCV) Never done  . DTaP/Tdap/Td Vaccines (1 - Tdap) Never done  . Zoster Vaccine (1 of 2) Never done  . RSV Adult and  Pregnancy (1 - 1-dose 75+ series) Never done  . COVID-19 Vaccine (5 - 2024-25 season) 09/12/2022  . Diabetes Eye Exam  07/14/2023     Risks, benefits, and alternatives of the medications and treatment plan prescribed today were discussed, and patient expressed understanding. Plan follow-up as discussed or as needed if any worsening symptoms or change in condition.    A yearly preventative health exam was recommended and current age based recommendations were discussed.   Subjective   Patient ID:  Dana Nielsen is a 78 y.o. (DOB 03/24/1945) female    Patient presents with  . Mobility     History of Present Illness The patient presents for evaluation of knee and hip pain, iron  deficiency anemia, and elevated blood pressure.  She is seeking a scooter that can be easily transported in an [place], as her mobility is increasingly limited due to pain in her knees and hips. She has been diagnosed with bone-on-bone arthritis in both knees. Her mobility is restricted to moving from room to room within her home, and she is unable to stand for extended periods or climb stairs without assistance. She relies on a cane and walker for support and has undergone physical therapy several times. She also performs exercises for her knees. She has not had recent x-rays of her hips or knees. She was previously under the care of an orthopedist who recommended knee replacement surgery, but she declined due  to a bleeding issue. She has received injections in her knees, but the relief was temporary. She is unable to take arthritis medication due to her bleeding condition. She has a history of left knee arthroscopic surgery for meniscus repair and recurrent bursitis in her sacrum.  She has a slow bleed in her small intestine, which has been sealed three times. She is under the care of a hematologist and has a history of anemia. Her hemoglobin levels increased slightly last month, but her iron  saturation decreased.  She is currently taking Fordix, which she reports is effective. She is unable to take iron  pills due to constipation and the risk of straining, which could exacerbate her bleeding. Her hematologist has discussed iron  infusions with her, but her iron  levels are currently within the normal range.  Her blood pressure was slightly elevated today, but she had not yet taken her medication.  She also suffers from hammertoes, which affect her balance.  PAST SURGICAL HISTORY: Left knee arthroscopic surgery for meniscus repair.   Current Outpatient Medications  Medication Instructions  . Blood Glucose Monitoring Suppl (ONETOUCH VERIO FLEX SYSTEM) w/Device KIT SMARTSIG:1 device As Directed  . CENTRUM (CENTRUM) chewable tablet 1 tablet, Daily  . cetirizine  (ZYRTEC ) 10 mg, Oral, Daily  . ergocalciferol (VITAMIN D2) 50,000 Units, Oral, Weekly  . finasteride (PROPECIA) 1 mg, Oral, Daily  . fluconazole (DIFLUCAN) 150 mg tablet Take 1 tablet as a single dose today. Take the second tablet if symptoms are still present in 4 days.  . furosemide (LASIX) 20 mg, As needed  . Insulin  Pen Needle (PEN NEEDLE/5-BEVEL TIP) 32G X 4 MM MISC 1 each, Subcutaneous, Daily  . Lancets (ONETOUCH DELICA PLUS LANCET33G) MISC SMARTSIG:1 Lancet(s) Topical 3 Times Daily PRN  . levothyroxine  sodium (SYNTHROID ,LEVOTHROID,LEVOXYL ) 100 mcg, Oral, Daily 0600  . losartan  potassium (COZAAR ) 25 mg, Daily  . losartan -hydrochlorothiazide (HYZAAR) 50-12.5 mg per tablet 1 tablet, Oral, Daily  . MAGNESIUM -OXIDE 400 mg, Daily  . metformin (GLUCOPHAGE) 1,000 mg, Oral, Two times a day with meals  . minoxidil (LONITEN) 2.5 mg, Oral, Daily  . omeprazole (PRILOSEC) 40 mg, Oral, Daily  . ONETOUCH VERIO test strip SMARTSIG:1 Strip(s) Via Meter 3 Times Daily PRN  . semaglutide (1 mg/dose) (OZEMPIC) 1 mg, Subcutaneous, Weekly  . simvastatin  (ZOCOR ) 40 mg, Oral, At bedtime  . traZODone  (DESYREL ) 100 mg, Oral, At bedtime   Patient Care Team: Alm FORBES Bilis, MD as PCP - General (Family Medicine) Norleen ONEIDA Federico MADISON, MD (Hematology) Sandor San GAILS, DO (Gastroenterology) Social History   Tobacco Use  . Smoking status: Never    Passive exposure: Never  . Smokeless tobacco: Never  Substance Use Topics  . Alcohol use: Yes    Alcohol/week: 2.0 standard drinks of alcohol    Types: 2 Drinks containing 0.5 oz of alcohol per week    Reviewed and updated this visit by provider: Tobacco  Allergies  Meds  Problems  Med Hx  Surg Hx  Fam Hx       Review of Systems is complete and negative except as noted.  Objective   Vitals:   08/23/23 1413 08/23/23 1437  BP: 142/68 138/70  Pulse: 87   Temp: 97.4 F (36.3 C)   TempSrc: Temporal   Resp: 16   Height: 5' 2 (1.575 m)   Weight: 190 lb (86.2 kg)   SpO2: 97%   BMI (Calculated): 34.7    Wt Readings from Last 3 Encounters:  08/23/23 190 lb (86.2 kg)  07/27/23 190 lb (86.2 kg)  05/17/23 188 lb (85.3 kg)      Constitutional: Well-developed and well-nourished.  Sitting comfortably conversing normally.  Eyes: Conjunctivae, lids, and EOM are normal. Pupils are equal, round, and reactive to light. Lymphatics: There is no anterior or posterior cervical adenopathy. Neck: Supple with normal range of motion. No thyroid  mass and no thyromegaly present. Cardiovascular: Regular rate and rhythm with normal heart sounds and no edema present. There are no carotid bruits. Respiratory: Normal effort and clear to auscultation without wheezing or prolonged expiration.  Musculoskeletal: Walking very slowly with her cane. Skin: Skin is warm and dry. No rashes or bruising noted.  Psychiatric: Behavior is Cooperative and Polite. Mood euthymyic. Affect is appropriate.  Computer technology was used to create visit note. Consent from the patient/caregiver was obtained prior to its use.  Alm FORBES Bilis, MD

## 2023-08-24 ENCOUNTER — Telehealth: Payer: Self-pay | Admitting: *Deleted

## 2023-08-24 ENCOUNTER — Other Ambulatory Visit: Payer: Self-pay | Admitting: Hematology and Oncology

## 2023-08-24 NOTE — Telephone Encounter (Signed)
 Dr. Federico reviewed recent iron  results and patient message with symptoms. He ordered IV iron  for patient. Contacted patient to inform her that IV iron  has been ordered. [address] [employer] will contact her to schedule infusions. Ms. Stricker verbalized understanding of all information and thanked caller.

## 2023-08-24 NOTE — Telephone Encounter (Signed)
 Patient Dana Nielsen with following: She saw Dr. Pura yesterday. Her HGB and iron  saturation are both down. She has very low energy and a persistent 24 hour headache. She wants to know if she needs IV iron .   Dr. Federico informed of patient concerns and question.

## 2023-08-29 ENCOUNTER — Inpatient Hospital Stay: Attending: Hematology and Oncology

## 2023-08-29 ENCOUNTER — Other Ambulatory Visit: Payer: Self-pay | Admitting: Hematology and Oncology

## 2023-08-30 ENCOUNTER — Telehealth: Payer: Self-pay

## 2023-08-30 ENCOUNTER — Encounter: Payer: Self-pay | Admitting: Hematology and Oncology

## 2023-08-30 NOTE — Telephone Encounter (Signed)
 Dr. Federico, patient will be scheduled as soon as possible.  Auth Submission: NO AUTH NEEDED Site of care: Site of care: [facility] Payer: [employer] Medication & CPT/J Code(s) submitted: Venofer  (Iron  Sucrose) J1756 Diagnosis Code:  Route of submission (phone, fax, portal):  Phone # Fax # Auth type: Buy/Bill PB Units/visits requested: 200mg  x 5 doses Reference number:  Approval from: 08/30/23 to 12/30/23

## 2023-09-07 ENCOUNTER — Encounter: Payer: Self-pay | Admitting: Physician Assistant

## 2023-09-07 ENCOUNTER — Encounter: Payer: Self-pay | Admitting: Hematology and Oncology

## 2023-09-07 ENCOUNTER — Ambulatory Visit (INDEPENDENT_AMBULATORY_CARE_PROVIDER_SITE_OTHER)

## 2023-09-07 VITALS — BP 158/84 | HR 83 | Temp 98.7°F | Resp 14 | Ht 62.0 in | Wt 191.6 lb

## 2023-09-07 DIAGNOSIS — K922 Gastrointestinal hemorrhage, unspecified: Secondary | ICD-10-CM

## 2023-09-07 DIAGNOSIS — D500 Iron deficiency anemia secondary to blood loss (chronic): Secondary | ICD-10-CM

## 2023-09-07 MED ORDER — IRON SUCROSE 20 MG/ML IV SOLN
200.0000 mg | Freq: Once | INTRAVENOUS | Status: AC
  Administered 2023-09-07: 200 mg via INTRAVENOUS
  Filled 2023-09-07: qty 10

## 2023-09-07 NOTE — Progress Notes (Signed)
 Diagnosis: Iron  deficiency anemia  Provider:  Praveen Mannam MD  Procedure: IV Push  IV Type: Peripheral, IV Location: R Antecubital  Venofer  (Iron  Sucrose), Dose: 200 mg  Post Infusion IV Care: Observation period completed and Peripheral IV Discontinued  Discharge: Condition: Good, Destination: Home . AVS Declined  Performed by:  Millissa Deese G [place]Burchett, RN

## 2023-09-14 ENCOUNTER — Ambulatory Visit

## 2023-09-14 VITALS — BP 121/75 | HR 103 | Temp 98.5°F | Resp 16 | Ht 62.0 in | Wt 190.0 lb

## 2023-09-14 DIAGNOSIS — D500 Iron deficiency anemia secondary to blood loss (chronic): Secondary | ICD-10-CM | POA: Diagnosis not present

## 2023-09-14 MED ORDER — IRON SUCROSE 20 MG/ML IV SOLN
200.0000 mg | Freq: Once | INTRAVENOUS | Status: AC
  Administered 2023-09-14: 200 mg via INTRAVENOUS

## 2023-09-14 NOTE — Progress Notes (Signed)
 Diagnosis: Iron  Deficiency Anemia  Provider:  Praveen Mannam MD  Procedure: IV Push  IV Type: Peripheral, IV Location: L Antecubital  Venofer  (Iron  Sucrose), Dose: 200 mg  Post Infusion IV Care: Observation period completed and Peripheral IV Discontinued  Discharge: Condition: Good, Destination: Home . AVS Declined  Performed by:  Leita FORBES Miles, LPN

## 2023-09-18 ENCOUNTER — Other Ambulatory Visit: Payer: Self-pay | Admitting: Hematology and Oncology

## 2023-09-19 ENCOUNTER — Inpatient Hospital Stay: Attending: Hematology and Oncology

## 2023-09-19 DIAGNOSIS — D500 Iron deficiency anemia secondary to blood loss (chronic): Secondary | ICD-10-CM | POA: Diagnosis present

## 2023-09-19 LAB — IRON AND IRON BINDING CAPACITY (CC-WL,HP ONLY)
Iron: 66 ug/dL (ref 28–170)
Saturation Ratios: 16 % (ref 10.4–31.8)
TIBC: 419 ug/dL (ref 250–450)
UIBC: 353 ug/dL (ref 148–442)

## 2023-09-19 LAB — CMP (CANCER CENTER ONLY)
ALT: 23 U/L (ref 0–44)
AST: 20 U/L (ref 15–41)
Albumin: 4.6 g/dL (ref 3.5–5.0)
Alkaline Phosphatase: 52 U/L (ref 38–126)
Anion gap: 8 (ref 5–15)
BUN: 13 mg/dL (ref 8–23)
CO2: 27 mmol/L (ref 22–32)
Calcium: 9.9 mg/dL (ref 8.9–10.3)
Chloride: 106 mmol/L (ref 98–111)
Creatinine: 0.72 mg/dL (ref 0.44–1.00)
GFR, Estimated: >60 mL/min (ref >60)
Glucose, Bld: 126 mg/dL — ABNORMAL HIGH (ref 70–99)
Potassium: 3.9 mmol/L (ref 3.5–5.1)
Sodium: 141 mmol/L (ref 135–145)
Total Bilirubin: 0.4 mg/dL (ref 0.0–1.2)
Total Protein: 7.6 g/dL (ref 6.5–8.1)

## 2023-09-19 LAB — CBC WITH DIFFERENTIAL (CANCER CENTER ONLY)
Abs Immature Granulocytes: 0.02 K/uL (ref 0.00–0.07)
Basophils Absolute: 0.1 K/uL (ref 0.0–0.1)
Basophils Relative: 1 %
Eosinophils Absolute: 0.2 K/uL (ref 0.0–0.5)
Eosinophils Relative: 3 %
HCT: 37.3 % (ref 36.0–46.0)
Hemoglobin: 11.8 g/dL — ABNORMAL LOW (ref 12.0–15.0)
Immature Granulocytes: 0 %
Lymphocytes Relative: 31 %
Lymphs Abs: 2 K/uL (ref 0.7–4.0)
MCH: 26.6 pg (ref 26.0–34.0)
MCHC: 31.6 g/dL (ref 30.0–36.0)
MCV: 84.2 fL (ref 80.0–100.0)
Monocytes Absolute: 0.4 K/uL (ref 0.1–1.0)
Monocytes Relative: 7 %
Neutro Abs: 3.6 K/uL (ref 1.7–7.7)
Neutrophils Relative %: 58 %
Platelet Count: 251 K/uL (ref 150–400)
RBC: 4.43 MIL/uL (ref 3.87–5.11)
RDW: 16.4 % — ABNORMAL HIGH (ref 11.5–15.5)
WBC Count: 6.3 K/uL (ref 4.0–10.5)
nRBC: 0 % (ref 0.0–0.2)

## 2023-09-19 LAB — RETIC PANEL
Immature Retic Fract: 14.8 % (ref 2.3–15.9)
RBC.: 4.49 MIL/uL (ref 3.87–5.11)
Retic Count, Absolute: 92.5 K/uL (ref 19.0–186.0)
Retic Ct Pct: 2.1 % (ref 0.4–3.1)
Reticulocyte Hemoglobin: 32.7 pg (ref >27.9)

## 2023-09-20 ENCOUNTER — Ambulatory Visit: Payer: Self-pay

## 2023-09-20 LAB — FERRITIN: Ferritin: 349 ng/mL — ABNORMAL HIGH (ref 11–307)

## 2023-09-21 ENCOUNTER — Ambulatory Visit (INDEPENDENT_AMBULATORY_CARE_PROVIDER_SITE_OTHER): Admitting: *Deleted

## 2023-09-21 VITALS — BP 116/73 | HR 92 | Temp 97.7°F | Resp 16 | Ht 62.0 in | Wt 192.6 lb

## 2023-09-21 DIAGNOSIS — D500 Iron deficiency anemia secondary to blood loss (chronic): Secondary | ICD-10-CM

## 2023-09-21 DIAGNOSIS — K922 Gastrointestinal hemorrhage, unspecified: Secondary | ICD-10-CM

## 2023-09-21 MED ORDER — IRON SUCROSE 20 MG/ML IV SOLN
200.0000 mg | Freq: Once | INTRAVENOUS | Status: AC
  Administered 2023-09-21: 200 mg via INTRAVENOUS
  Filled 2023-09-21: qty 10

## 2023-09-21 NOTE — Progress Notes (Signed)
 Diagnosis: Iron Deficiency Anemia  Provider:  Chilton Greathouse MD  Procedure: IV Push  IV Type: Peripheral, IV Location: R Antecubital  Venofer (Iron Sucrose), Dose: 200 mg  Post Infusion IV Care: Observation period completed and Peripheral IV Discontinued  Discharge: Condition: Good, Destination: Home . AVS Declined  Performed by:  Forrest Moron, RN

## 2023-09-28 ENCOUNTER — Ambulatory Visit (INDEPENDENT_AMBULATORY_CARE_PROVIDER_SITE_OTHER)

## 2023-09-28 ENCOUNTER — Ambulatory Visit

## 2023-09-28 VITALS — BP 125/78 | HR 96 | Temp 97.8°F | Resp 16 | Ht 62.0 in | Wt 189.0 lb

## 2023-09-28 DIAGNOSIS — D500 Iron deficiency anemia secondary to blood loss (chronic): Secondary | ICD-10-CM | POA: Diagnosis not present

## 2023-09-28 DIAGNOSIS — K922 Gastrointestinal hemorrhage, unspecified: Secondary | ICD-10-CM | POA: Diagnosis not present

## 2023-09-28 MED ORDER — IRON SUCROSE 20 MG/ML IV SOLN
200.0000 mg | Freq: Once | INTRAVENOUS | Status: AC
  Administered 2023-09-28: 200 mg via INTRAVENOUS
  Filled 2023-09-28: qty 10

## 2023-09-28 NOTE — Progress Notes (Signed)
 Diagnosis: Iron  Deficiency Anemia  Provider:  Praveen Mannam MD  Procedure: IV Push  IV Type: Peripheral, IV Location: L Antecubital  Venofer  (Iron  Sucrose), Dose: 200 mg  Post Infusion IV Care: Observation period completed and Peripheral IV Discontinued  Discharge: Condition: Good, Destination: Home . AVS Declined  Performed by:  Leita FORBES Miles, LPN

## 2023-10-05 ENCOUNTER — Ambulatory Visit (INDEPENDENT_AMBULATORY_CARE_PROVIDER_SITE_OTHER)

## 2023-10-05 ENCOUNTER — Ambulatory Visit

## 2023-10-05 VITALS — BP 122/79 | HR 95 | Temp 97.8°F | Resp 16 | Ht 62.0 in | Wt 190.4 lb

## 2023-10-05 DIAGNOSIS — K922 Gastrointestinal hemorrhage, unspecified: Secondary | ICD-10-CM

## 2023-10-05 DIAGNOSIS — D500 Iron deficiency anemia secondary to blood loss (chronic): Secondary | ICD-10-CM | POA: Diagnosis not present

## 2023-10-05 MED ORDER — IRON SUCROSE 20 MG/ML IV SOLN
200.0000 mg | Freq: Once | INTRAVENOUS | Status: AC
  Administered 2023-10-05: 200 mg via INTRAVENOUS
  Filled 2023-10-05: qty 10

## 2023-10-05 NOTE — Progress Notes (Signed)
 Diagnosis: Iron Deficiency Anemia  Provider:  Chilton Greathouse MD  Procedure: IV Push  IV Type: Peripheral, IV Location: R Antecubital  Venofer (Iron Sucrose), Dose: 200 mg  Post Infusion IV Care: Observation period completed and Peripheral IV Discontinued  Discharge: Condition: Stable, Destination: Home . AVS Declined  Performed by:  Wyvonne Lenz, RN

## 2023-10-13 NOTE — Progress Notes (Signed)
 [name] D [facility] [phone]   [phone]  PROGRESS NOTE  Patient Care Team: Pura Lenis, MD as PCP - General (Family Medicine)  Hematological/Oncological History #Iron  Deficiency Anemia 2/2 to GI Bleeding 1) 2019: presented to [facility] in [facility] with symptoms of anemia. Found to have Hgb 5.0. EGD/colon/capsule reported found no source of bleed 2) 01/18/2019: establish care with Dr. Federico  3) 03/08/2019: admitted for acute GI bleed. Endoscopy revealed an actively bleeding Dieulafoy lesion that was clipped. Received IV feraheme  510mg  x 1 dose during admission 4) 03/15/2019: received dose 2 of IV feraheme  510mg  in outpatient setting. 5) 04/13/2019: WBC 8.6, Hgb 12.2, MCV 92.1, Plt 216 6) 07/19/2019: WBC 7.1, Hgb 12.2, MCV 90.6, Plt 226 7) 09/20/2021-09/21/2021: IV Ferrleciti 250 mg x 2 doses 8) 10/15/2021-1012/2023: IV venofer  200 mg x 2 doses 9) 08/02/2022: WBC 6.6, Hgb 12.8, MCV 86.3, Plt 235  Interval History:  Dana Nielsen 78 y.o. female with medical history significant for Dieulafoy lesion bleeding with iron  deficiency anemia who presents for a follow up visit. The patient's last visit was on 07/18/2023. In the interim since the last visit she reports she has had no major changes in her health.  On exam today Dana Nielsen reports she has been feeling better overall in the room since our last visit.  She reports her energy today is about a 7 out of 10.  She is not having any lightheadedness, dizziness, or shortness of breath.  She reports that her first 3 infusions went well but with the 4th and 5th 1 she developed headaches.  She notes that she is still taking her liquid Floradix to try to help boost her iron  levels.  She notes that she is currently on Ozempic therapy and it is not working.  She notes her target weight is 170 pounds.  She reports that she does feel tired on occasion but pushes herself.  She knows she is not having any overt signs of  bleeding, bruising, or dark stools.  Overall she feels well and has no additional questions concerns or complaints today.  Full 10 point ROS otherwise negative.  MEDICAL HISTORY:  Past Medical History:  Diagnosis Date   Allergy    Cataract    Endometrial mass    Family history of colon cancer 12/09/2020   Family history of lung cancer 12/09/2020   Family history of multiple myeloma 12/09/2020   Full dentures    GERD (gastroesophageal reflux disease)    H/O: upper GI bleed    las time 09-19-2021  s/p EGD with bleeding Dielufoy lesion of distal duodedum tx'd APC and clips;  previous gi bleed 03-08-2019  s/p EGD w/ hemastatis and clips   Heart murmur    History of gastric ulcer    w/ upper GI bleed 03-08-2019 gastric ulcer nonbleeding and duodunal bleeding lesion   HLD (hyperlipidemia)    Hypertension    Hypothyroidism    followed by pcp   Iron  deficiency anemia    hematologist--- dr jinny. federico;   due to hx GI bleed and blood product refusal   MDD (major depressive disorder)    OA (osteoarthritis)    hands, knees   Ovarian cyst, right    Refusal of blood transfusions as patient is Jehovah's Witness    Type 2 diabetes mellitus (HCC)    followed by pcp  (12-29-2021  per pt check blood sugar daily in am fasting, average 105--110)   Wears hearing aid in both ears  SURGICAL HISTORY: Past Surgical History:  Procedure Laterality Date   BIOPSY  03/08/2019   Procedure: BIOPSY;  Surgeon: San Sandor GAILS, DO;  Location: [facility];  Service: Gastroenterology;;   BREAST BIOPSY Left    CATARACT EXTRACTION Left 05/2022   [place]  [place] in [place]   DILATATION & CURETTAGE/HYSTEROSCOPY WITH MYOSURE N/A 12/30/2021   Procedure: DILATATION & CURETTAGE/HYSTEROSCOPY WITH MYOSURE;  Surgeon: Storm Setter, DO;  Location: [facility] [place];  Service: Gynecology;  Laterality: N/A;   DILATION AND CURETTAGE OF UTERUS     yrs ago   ENTEROSCOPY N/A  03/08/2019   Procedure: ENTEROSCOPY;  Surgeon: San Sandor GAILS, DO;  Location: [facility];  Service: Gastroenterology;  Laterality: N/A;   ENTEROSCOPY N/A 09/19/2021   Procedure: ENTEROSCOPY;  Surgeon: Leigh Elspeth SQUIBB, MD;  Location: [facility];  Service: Gastroenterology;  Laterality: N/A;   HEMOSTASIS CLIP PLACEMENT  03/08/2019   Procedure: HEMOSTASIS CLIP PLACEMENT;  Surgeon: San Sandor GAILS, DO;  Location: [facility];  Service: Gastroenterology;;   HEMOSTASIS CLIP PLACEMENT  09/19/2021   Procedure: HEMOSTASIS CLIP PLACEMENT;  Surgeon: Leigh Elspeth SQUIBB, MD;  Location: [facility];  Service: Gastroenterology;;   HOT HEMOSTASIS N/A 09/19/2021   Procedure: HOT HEMOSTASIS (ARGON PLASMA COAGULATION/BICAP);  Surgeon: Leigh Elspeth SQUIBB, MD;  Location: [facility];  Service: Gastroenterology;  Laterality: N/A;   KNEE ARTHROSCOPY Left 2003   LAPAROSCOPIC BILATERAL SALPINGO OOPHERECTOMY Bilateral 12/30/2021   Procedure: LAPAROSCOPIC BILATERAL SALPINGO OOPHORECTOMY;  Surgeon: Storm Setter, DO;  Location: [facility] [place];  Service: Gynecology;  Laterality: Bilateral;   SUBMUCOSAL TATTOO INJECTION  03/08/2019   Procedure: SUBMUCOSAL TATTOO INJECTION;  Surgeon: San Sandor GAILS, DO;  Location: [facility];  Service: Gastroenterology;;   SUBMUCOSAL TATTOO INJECTION  09/19/2021   Procedure: SUBMUCOSAL TATTOO INJECTION;  Surgeon: Leigh Elspeth SQUIBB, MD;  Location: [facility];  Service: Gastroenterology;;   TONSILLECTOMY     child   TUBAL LIGATION Bilateral 1990   WISDOM TOOTH EXTRACTION     WRIST FRACTURE SURGERY Right 2013   per pt for crush injury,  no hardware    SOCIAL HISTORY: Social History   Socioeconomic History   Marital status: Widowed    Spouse name: Not on file   Number of children: 9   Years of education: Not on file   Highest education level: Not on file  Occupational History   Not on file  Tobacco Use   Smoking status: Never    Smokeless tobacco: Never  Vaping Use   Vaping status: Never Used  Substance and Sexual Activity   Alcohol use: Yes    Comment: maybe once a month   Drug use: Never   Sexual activity: Not on file  Other Topics Concern   Not on file  Social History Narrative   Not on file   Social Drivers of Health   Financial Resource Strain: Low Risk  (07/27/2023)   [facility]   Overall Financial Resource Strain (CARDIA)    How hard is it for you to pay for the very basics like food, housing, medical care, and heating?: Not hard at all  Food Insecurity: No Food Insecurity (07/27/2023)   Received from [facility]   Hunger Vital Sign    Within the past 12 months, you worried that your food would run out before you got the money to buy more.: Never true    Within the past 12 months, the food you  bought just didn't last and you didn't have money to get more.: Never true  Transportation Needs: Unmet Transportation Needs (07/27/2023)   Received from [facility] - Transportation    In the past 12 months, has lack of transportation kept you from medical appointments or from getting medications?: Yes    In the past 12 months, has lack of transportation kept you from meetings, work, or from getting things needed for daily living?: Yes  Physical Activity: Inactive (07/27/2023)   Received from [facility]   Exercise Vital Sign    On average, how many days per week do you engage in moderate to strenuous exercise (like a brisk walk)?: 0 days    Minutes of Exercise per Session: Not on file  Stress: Stress Concern Present (07/27/2023)   Received from [facility] - Occupational Stress Questionnaire    Do you feel stress - tense, restless, nervous, or anxious, or unable to sleep at night because your mind is troubled all the time - these days?: Very much  Social Connections: Somewhat Isolated (07/27/2023)   Received from [facility]    Social Network    How would you rate your social network (family, work, friends)?: Restricted participation with some degree of social isolation  Intimate Partner Violence: Not At Risk (07/27/2023)   Received from [facility]   HITS    Over the last 12 months how often did your partner physically hurt you?: Never    Over the last 12 months how often did your partner insult you or talk down to you?: Never    Over the last 12 months how often did your partner threaten you with physical harm?: Never    Over the last 12 months how often did your partner scream or curse at you?: Never    FAMILY HISTORY: Family History  Problem Relation Age of Onset   Multiple myeloma Mother 64   Heart attack Father    Lung cancer Sister 67       smoking hx   Leukemia Paternal Aunt        dx before 53   Cancer Paternal Aunt        unknown type; dx after 61   Colon cancer Paternal Aunt        dx after 50   Head & neck cancer Paternal Uncle        dx before 65   Cancer Paternal Uncle        unknown type; dx after 50   Cancer Paternal Uncle        color or gastric; dx after 50   Esophageal cancer Neg Hx    Stomach cancer Neg Hx    Rectal cancer Neg Hx     ALLERGIES:  is allergic to lidocaine , macrobid  [nitrofurantoin ], and minoxidil.  MEDICATIONS:  Current Outpatient Medications  Medication Sig Dispense Refill   AMBULATORY NON FORMULARY MEDICATION Take 2 capsules by mouth daily. Medication Name: FLORADIX (iron  supplement)     cetirizine  (ZYRTEC ) 10 MG tablet Take 10 mg by mouth daily.     finasteride (PROPECIA) 1 MG tablet Take by mouth.     glucose blood (ACCU-CHEK AVIVA PLUS) test strip Use to test your blood sugar In Vitro twice a day     losartan -hydrochlorothiazide (HYZAAR) 50-12.5 MG tablet Take 1 tablet by mouth daily.     MAGNESIUM -OXIDE 400 (240 Mg) MG tablet Take 1 tablet by mouth once daily  30 tablet 0   metFORMIN (GLUCOPHAGE) 1000 MG tablet Take 1,000 mg by mouth 2 (two) times daily  with a meal.     Multiple Vitamins-Minerals (HAIR/SKIN/NAILS) CAPS Take 1 capsule by mouth in the morning.     Multiple Vitamins-Minerals (ONE-A-DAY WOMENS 50+) TABS Take 1 tablet by mouth in the morning.     omeprazole (PRILOSEC) 40 MG capsule Take 40 mg by mouth in the morning.     OVER THE COUNTER MEDICATION Pt taking oral Monoxidil     Semaglutide (OZEMPIC, 0.25 OR 0.5 MG/DOSE, [place]) Inject 0.5 mg into the skin once a week.     simvastatin  (ZOCOR ) 40 MG tablet Take 1 tablet (40 mg total) by mouth daily at 6 PM. (Patient taking differently: Take 40 mg by mouth at bedtime.) 90 tablet 3   SYNTHROID  100 MCG tablet Take 100 mcg by mouth daily before breakfast.     traZODone  (DESYREL ) 100 MG tablet TAKE 1 TABLET BY MOUTH AT BEDTIME (Patient taking differently: Take 100 mg by mouth at bedtime.) 90 tablet 0   Vitamin D, Ergocalciferol, (DRISDOL) 1.25 MG (50000 UNIT) CAPS capsule Take 50,000 Units by mouth every 7 (seven) days.     No current facility-administered medications for this visit.    REVIEW OF SYSTEMS:   Constitutional: ( - ) fevers, ( - )  chills , ( - ) night sweats Eyes: ( - ) blurriness of vision, ( - ) double vision, ( - ) watery eyes Ears, nose, mouth, throat, and face: ( - ) mucositis, ( - ) sore throat Respiratory: ( - ) cough, ( - ) dyspnea, ( - ) wheezes Cardiovascular: ( - ) palpitation, ( - ) chest discomfort, ( - ) lower extremity swelling Gastrointestinal:  ( - ) nausea, ( - ) heartburn, ( - ) change in bowel habits Skin: ( - ) abnormal skin rashes Lymphatics: ( - ) new lymphadenopathy, ( - ) easy bruising Neurological: ( - ) numbness, ( - ) tingling, ( - ) new weaknesses Behavioral/Psych: ( - ) mood change, ( - ) new changes  All other systems were reviewed with the patient and are negative.  PHYSICAL EXAMINATION: ECOG PERFORMANCE STATUS: 1 - Symptomatic but completely ambulatory  Vitals:   10/17/23 1502  BP: 122/72  Pulse: 93  Resp: 16  Temp: (!) 97.3 F (36.3  C)  SpO2: 98%       Filed Weights   10/17/23 1502  Weight: 189 lb 8 oz (86 kg)        GENERAL:well appearing elderly Caucasian female alert, no distress and comfortable SKIN: skin color, texture, turgor are normal, no rashes or significant lesions EYES: conjunctiva are pink and non-injected, sclera clear LUNGS: clear to auscultation and percussion with normal breathing effort HEART: regular rate & rhythm and no murmurs and no lower extremity edema Musculoskeletal: no cyanosis of digits and no clubbing  PSYCH: alert & oriented x 3, fluent speech NEURO: no focal motor/sensory deficits  LABORATORY DATA:  I have reviewed the data as listed    Latest Ref Rng & Units 10/17/2023    2:47 PM 09/19/2023    3:27 PM 08/08/2023    3:43 PM  CBC  WBC 4.0 - 10.5 K/uL 6.4  6.3  7.9   Hemoglobin 12.0 - 15.0 g/dL 87.6  88.1  88.0   Hematocrit 36.0 - 46.0 % 39.0  37.3  36.8   Platelets 150 - 400 K/uL 217  251  256  Latest Ref Rng & Units 10/17/2023    2:47 PM 09/19/2023    3:27 PM 08/08/2023    3:43 PM  CMP  Glucose 70 - 99 mg/dL 851  873  878   BUN 8 - 23 mg/dL 12  13  27    Creatinine 0.44 - 1.00 mg/dL 9.12  9.27  9.02   Sodium 135 - 145 mmol/L 140  141  139   Potassium 3.5 - 5.1 mmol/L 4.4  3.9  4.1   Chloride 98 - 111 mmol/L 102  106  103   CO2 22 - 32 mmol/L 29  27  24    Calcium 8.9 - 10.3 mg/dL 89.8  9.9  89.6   Total Protein 6.5 - 8.1 g/dL 7.3  7.6  7.7   Total Bilirubin 0.0 - 1.2 mg/dL 0.4  0.4  0.4   Alkaline Phos 38 - 126 U/L 46  52  43   AST 15 - 41 U/L 18  20  25    ALT 0 - 44 U/L 18  23  25        RADIOGRAPHIC STUDIES: No results found.  ASSESSMENT & PLAN Itali Mckendry 78 y.o. female with medical history significant for Dieulafoy lesion bleeding with iron  deficiency anemia who presents for a follow up visit.   #Iron  Deficiency Anemia 2/2 to GI Bleeding --no evidence of bleeding on today's exam.  --most recent small bowel endoscopy from 09/19/2021 showed  dieulafoy lesion with bleeding in the distal duodenum, a few cms distal to prior tattoo.Treated with argon plasma coagulation --last received IV venofer  200 mg x 5 doses on 8/27-9/24/2025 --unable to tolerate PO iron  due to chronic constipation, does tolerate liquid iron . --labs today shows white blood cell 6.4, hemoglobin 12.3, MCV 86.3, platelets 217 --awaiting final results of iron  panel to determine if IV iron  is required.  --RTC q 4 weeks for labs and 12 weeks for clinic visit     # Hypomagnesemia -- Patient has low levels of magnesium , unclear etiology.  May be secondary to diarrhea bouts --Patient takes 400 mg p.o. of magnesium  daily.   --magnesium  levels today pending. Last 1.5 on 04/14/2022.  --recommend to continue magnesium  supplement.   #Jehovah's Witness --the patient is not to receive blood products under any circumstances, per her request. We have copied and uploaded her Blood Contract which she carries into her wallet into our system. --in the event the patient is admitted with severe anemia or bleed please make the Hematology service aware so that we may provide bloodless options for support.    No orders of the defined types were placed in this encounter.  All questions were answered. The patient knows to call the clinic with any problems, questions or concerns.  I have spent a total of 30 minutes minutes of face-to-face and non-face-to-face time, preparing to see the patient, performing a medically appropriate examination, counseling and educating the patient, ordering medications, documenting clinical information in the electronic health record, and care coordination.   Norleen IVAR Kidney, MD Department of Hematology/Oncology [facility] Phone: [phone] Pager: [phone] Email: [email or web address]  10/25/2023 1:59 PM

## 2023-10-16 ENCOUNTER — Other Ambulatory Visit: Payer: Self-pay | Admitting: Hematology and Oncology

## 2023-10-17 ENCOUNTER — Inpatient Hospital Stay: Attending: Hematology and Oncology

## 2023-10-17 ENCOUNTER — Inpatient Hospital Stay (HOSPITAL_BASED_OUTPATIENT_CLINIC_OR_DEPARTMENT_OTHER): Admitting: Hematology and Oncology

## 2023-10-17 VITALS — BP 122/72 | HR 93 | Temp 97.3°F | Resp 16 | Wt 189.5 lb

## 2023-10-17 DIAGNOSIS — D500 Iron deficiency anemia secondary to blood loss (chronic): Secondary | ICD-10-CM

## 2023-10-17 DIAGNOSIS — Z806 Family history of leukemia: Secondary | ICD-10-CM | POA: Insufficient documentation

## 2023-10-17 DIAGNOSIS — Z800 Family history of malignant neoplasm of digestive organs: Secondary | ICD-10-CM | POA: Diagnosis not present

## 2023-10-17 DIAGNOSIS — Z807 Family history of other malignant neoplasms of lymphoid, hematopoietic and related tissues: Secondary | ICD-10-CM | POA: Diagnosis not present

## 2023-10-17 DIAGNOSIS — D649 Anemia, unspecified: Secondary | ICD-10-CM

## 2023-10-17 DIAGNOSIS — Z801 Family history of malignant neoplasm of trachea, bronchus and lung: Secondary | ICD-10-CM | POA: Insufficient documentation

## 2023-10-17 DIAGNOSIS — Z808 Family history of malignant neoplasm of other organs or systems: Secondary | ICD-10-CM | POA: Insufficient documentation

## 2023-10-17 LAB — CBC WITH DIFFERENTIAL (CANCER CENTER ONLY)
Abs Immature Granulocytes: 0.07 K/uL (ref 0.00–0.07)
Basophils Absolute: 0.1 K/uL (ref 0.0–0.1)
Basophils Relative: 1 %
Eosinophils Absolute: 0.2 K/uL (ref 0.0–0.5)
Eosinophils Relative: 3 %
HCT: 39 % (ref 36.0–46.0)
Hemoglobin: 12.3 g/dL (ref 12.0–15.0)
Immature Granulocytes: 1 %
Lymphocytes Relative: 35 %
Lymphs Abs: 2.2 K/uL (ref 0.7–4.0)
MCH: 27.2 pg (ref 26.0–34.0)
MCHC: 31.5 g/dL (ref 30.0–36.0)
MCV: 86.3 fL (ref 80.0–100.0)
Monocytes Absolute: 0.6 K/uL (ref 0.1–1.0)
Monocytes Relative: 9 %
Neutro Abs: 3.3 K/uL (ref 1.7–7.7)
Neutrophils Relative %: 51 %
Platelet Count: 217 K/uL (ref 150–400)
RBC: 4.52 MIL/uL (ref 3.87–5.11)
RDW: 16.5 % — ABNORMAL HIGH (ref 11.5–15.5)
WBC Count: 6.4 K/uL (ref 4.0–10.5)
nRBC: 0 % (ref 0.0–0.2)

## 2023-10-17 LAB — CMP (CANCER CENTER ONLY)
ALT: 18 U/L (ref 0–44)
AST: 18 U/L (ref 15–41)
Albumin: 4.5 g/dL (ref 3.5–5.0)
Alkaline Phosphatase: 46 U/L (ref 38–126)
Anion gap: 9 (ref 5–15)
BUN: 12 mg/dL (ref 8–23)
CO2: 29 mmol/L (ref 22–32)
Calcium: 10.1 mg/dL (ref 8.9–10.3)
Chloride: 102 mmol/L (ref 98–111)
Creatinine: 0.87 mg/dL (ref 0.44–1.00)
GFR, Estimated: >60 mL/min (ref >60)
Glucose, Bld: 148 mg/dL — ABNORMAL HIGH (ref 70–99)
Potassium: 4.4 mmol/L (ref 3.5–5.1)
Sodium: 140 mmol/L (ref 135–145)
Total Bilirubin: 0.4 mg/dL (ref 0.0–1.2)
Total Protein: 7.3 g/dL (ref 6.5–8.1)

## 2023-10-17 LAB — RETIC PANEL
Immature Retic Fract: 16.6 % — ABNORMAL HIGH (ref 2.3–15.9)
RBC.: 4.49 MIL/uL (ref 3.87–5.11)
Retic Count, Absolute: 69.1 K/uL (ref 19.0–186.0)
Retic Ct Pct: 1.5 % (ref 0.4–3.1)
Reticulocyte Hemoglobin: 33.3 pg (ref >27.9)

## 2023-10-17 LAB — IRON AND IRON BINDING CAPACITY (CC-WL,HP ONLY)
Iron: 64 ug/dL (ref 28–170)
Saturation Ratios: 17 % (ref 10.4–31.8)
TIBC: 381 ug/dL (ref 250–450)
UIBC: 317 ug/dL (ref 148–442)

## 2023-10-17 LAB — FERRITIN: Ferritin: 439 ng/mL — ABNORMAL HIGH (ref 11–307)

## 2023-10-25 ENCOUNTER — Encounter: Payer: Self-pay | Admitting: Physician Assistant

## 2023-10-25 ENCOUNTER — Encounter: Payer: Self-pay | Admitting: Hematology and Oncology

## 2023-11-12 ENCOUNTER — Other Ambulatory Visit: Payer: Self-pay | Admitting: Hematology and Oncology

## 2023-11-14 ENCOUNTER — Encounter: Payer: Self-pay | Admitting: Physician Assistant

## 2023-11-14 ENCOUNTER — Encounter: Payer: Self-pay | Admitting: Hematology and Oncology

## 2023-11-21 ENCOUNTER — Telehealth: Payer: Self-pay

## 2023-11-21 NOTE — Telephone Encounter (Signed)
 Attempted to reach patient by phone to scheduled 1 year follow up appointment with Dr San or a POD B app.  Will continue efforts.

## 2023-11-21 NOTE — Telephone Encounter (Signed)
-----   Message from [facility] [place] [facility] sent at 10/31/2023  4:12 PM EDT ----- Regarding: FW: [id number]  ----- Message ----- From: Benjamine Nat DEL, CMA Sent: 10/31/2023  12:00 AM EDT To: Nat DEL Benjamine, CMA Subject: [id number]                                    89yr, gastric ulcers, hepatic steatosis, VC

## 2023-11-22 NOTE — Telephone Encounter (Signed)
 Patient aware that she is scheduled for 1 year follow up with Dr Quenton on 01-19-24 at 1:40pm.  Patient agreed to plan and verbalized understanding.  No further questions or concerns.

## 2023-12-12 ENCOUNTER — Inpatient Hospital Stay: Attending: Hematology and Oncology

## 2023-12-12 ENCOUNTER — Other Ambulatory Visit: Payer: Self-pay | Admitting: Hematology and Oncology

## 2023-12-12 DIAGNOSIS — D500 Iron deficiency anemia secondary to blood loss (chronic): Secondary | ICD-10-CM | POA: Insufficient documentation

## 2023-12-12 DIAGNOSIS — K922 Gastrointestinal hemorrhage, unspecified: Secondary | ICD-10-CM | POA: Insufficient documentation

## 2023-12-12 LAB — RETIC PANEL
Immature Retic Fract: 13.9 % (ref 2.3–15.9)
RBC.: 4.47 MIL/uL (ref 3.87–5.11)
Retic Count, Absolute: 70.6 K/uL (ref 19.0–186.0)
Retic Ct Pct: 1.6 % (ref 0.4–3.1)
Reticulocyte Hemoglobin: 32.8 pg (ref >27.9)

## 2023-12-12 LAB — CBC WITH DIFFERENTIAL (CANCER CENTER ONLY)
Abs Immature Granulocytes: 0.03 K/uL (ref 0.00–0.07)
Basophils Absolute: 0.1 K/uL (ref 0.0–0.1)
Basophils Relative: 1 %
Eosinophils Absolute: 0.2 K/uL (ref 0.0–0.5)
Eosinophils Relative: 2 %
HCT: 38.8 % (ref 36.0–46.0)
Hemoglobin: 12.9 g/dL (ref 12.0–15.0)
Immature Granulocytes: 0 %
Lymphocytes Relative: 23 %
Lymphs Abs: 1.9 K/uL (ref 0.7–4.0)
MCH: 28.7 pg (ref 26.0–34.0)
MCHC: 33.2 g/dL (ref 30.0–36.0)
MCV: 86.2 fL (ref 80.0–100.0)
Monocytes Absolute: 0.7 K/uL (ref 0.1–1.0)
Monocytes Relative: 9 %
Neutro Abs: 5 K/uL (ref 1.7–7.7)
Neutrophils Relative %: 65 %
Platelet Count: 239 K/uL (ref 150–400)
RBC: 4.5 MIL/uL (ref 3.87–5.11)
RDW: 15.1 % (ref 11.5–15.5)
WBC Count: 7.9 K/uL (ref 4.0–10.5)
nRBC: 0 % (ref 0.0–0.2)

## 2023-12-12 LAB — CMP (CANCER CENTER ONLY)
ALT: 24 U/L (ref 0–44)
AST: 26 U/L (ref 15–41)
Albumin: 4.5 g/dL (ref 3.5–5.0)
Alkaline Phosphatase: 59 U/L (ref 38–126)
Anion gap: 12 (ref 5–15)
BUN: 18 mg/dL (ref 8–23)
CO2: 24 mmol/L (ref 22–32)
Calcium: 10.6 mg/dL — ABNORMAL HIGH (ref 8.9–10.3)
Chloride: 103 mmol/L (ref 98–111)
Creatinine: 0.87 mg/dL (ref 0.44–1.00)
GFR, Estimated: >60 mL/min (ref >60)
Glucose, Bld: 129 mg/dL — ABNORMAL HIGH (ref 70–99)
Potassium: 4 mmol/L (ref 3.5–5.1)
Sodium: 140 mmol/L (ref 135–145)
Total Bilirubin: 0.5 mg/dL (ref 0.0–1.2)
Total Protein: 7.1 g/dL (ref 6.5–8.1)

## 2023-12-12 LAB — IRON AND IRON BINDING CAPACITY (CC-WL,HP ONLY)
Iron: 80 ug/dL (ref 28–170)
Saturation Ratios: 21 % (ref 10.4–31.8)
TIBC: 377 ug/dL (ref 250–450)
UIBC: 296 ug/dL

## 2023-12-12 LAB — FERRITIN: Ferritin: 334 ng/mL — ABNORMAL HIGH (ref 11–307)

## 2023-12-19 ENCOUNTER — Ambulatory Visit: Payer: Self-pay

## 2023-12-19 NOTE — Telephone Encounter (Signed)
-----   Message from Nurse Almarie T sent at 12/19/2023 11:52 AM EST -----  ----- Message ----- From: Federico Norleen ONEIDA MADISON, MD Sent: 12/15/2023   1:33 PM EST To: Almarie DELENA Arabia, RN  Please let Mrs. Grim know that her iron  levels are strong with a good Hgb 12.9. We will plan to check her labs again in Jan 2026 ----- Message ----- From: Rebecka, Lab In [place] Sent: 12/12/2023   3:33 PM EST To: Norleen ONEIDA Federico MADISON, MD

## 2023-12-19 NOTE — Telephone Encounter (Signed)
 TCT patient regarding recent lab results No answer. Was able to leave vm requesting call back to [phone]. To advise that her iron  levels are strong with a good Hgb 12.9. We will plan to check her labs again in Jan 2026

## 2023-12-21 ENCOUNTER — Telehealth: Payer: Self-pay

## 2023-12-21 NOTE — Telephone Encounter (Signed)
 Spoke with pt regarding recent lab results. Reminded of next appt Jan 16, 2024. Pt verbalized understanding.

## 2024-01-11 ENCOUNTER — Other Ambulatory Visit: Payer: Self-pay | Admitting: Hematology and Oncology

## 2024-01-15 NOTE — Progress Notes (Unsigned)
 Rescheduled

## 2024-01-16 ENCOUNTER — Telehealth: Payer: Self-pay | Admitting: Hematology and Oncology

## 2024-01-16 ENCOUNTER — Inpatient Hospital Stay: Attending: Hematology and Oncology

## 2024-01-16 ENCOUNTER — Inpatient Hospital Stay: Admitting: Hematology and Oncology

## 2024-01-16 DIAGNOSIS — D649 Anemia, unspecified: Secondary | ICD-10-CM

## 2024-01-16 DIAGNOSIS — D500 Iron deficiency anemia secondary to blood loss (chronic): Secondary | ICD-10-CM

## 2024-01-16 NOTE — Telephone Encounter (Signed)
 Patient called in to reschedule lab and MD appointments from 01/16/2024 to 02/13/2024 due to being sick.

## 2024-01-17 ENCOUNTER — Encounter: Payer: Self-pay | Admitting: Physician Assistant

## 2024-01-19 ENCOUNTER — Ambulatory Visit: Admitting: Gastroenterology

## 2024-01-19 ENCOUNTER — Encounter: Payer: Self-pay | Admitting: Gastroenterology

## 2024-01-19 VITALS — BP 110/72 | HR 86 | Ht 62.0 in | Wt 186.0 lb

## 2024-01-19 DIAGNOSIS — Z8719 Personal history of other diseases of the digestive system: Secondary | ICD-10-CM

## 2024-01-19 DIAGNOSIS — K760 Fatty (change of) liver, not elsewhere classified: Secondary | ICD-10-CM

## 2024-01-19 DIAGNOSIS — Z8711 Personal history of peptic ulcer disease: Secondary | ICD-10-CM

## 2024-01-19 DIAGNOSIS — Z760 Encounter for issue of repeat prescription: Secondary | ICD-10-CM

## 2024-01-19 DIAGNOSIS — E118 Type 2 diabetes mellitus with unspecified complications: Secondary | ICD-10-CM | POA: Diagnosis not present

## 2024-01-19 DIAGNOSIS — K3182 Dieulafoy lesion (hemorrhagic) of stomach and duodenum: Secondary | ICD-10-CM

## 2024-01-19 DIAGNOSIS — D509 Iron deficiency anemia, unspecified: Secondary | ICD-10-CM | POA: Diagnosis not present

## 2024-01-19 DIAGNOSIS — T887XXA Unspecified adverse effect of drug or medicament, initial encounter: Secondary | ICD-10-CM | POA: Diagnosis not present

## 2024-01-19 DIAGNOSIS — K219 Gastro-esophageal reflux disease without esophagitis: Secondary | ICD-10-CM | POA: Diagnosis not present

## 2024-01-19 MED ORDER — OMEPRAZOLE 40 MG PO CPDR: 40.0000 mg | DELAYED_RELEASE_CAPSULE | Freq: Every morning | ORAL | 11 refills | Status: DC

## 2024-01-19 MED ORDER — OMEPRAZOLE 40 MG PO CPDR: 40.0000 mg | DELAYED_RELEASE_CAPSULE | Freq: Every morning | ORAL | 3 refills | Status: AC

## 2024-01-19 NOTE — Progress Notes (Signed)
 "  Chief Complaint:    Nausea, decreased appetite, GERD  GI History: 79 year old female [employer] with history of diabetes, hyperlipidemia, hypothyroidism, IDA, hiatal hernia, gastric ulcer.   History of obscure GI bleed and IDA with prior extensive evaluation in [place], [place]. Reports having a negative/normal EGD, colonoscopy, VCE (no records for review).   Hospital admission 02/2019 with upper GI bleed 2/2 actively bleeding duodenal Dieulafoy lesion.     - 03/08/2019: Inpatient push enteroscopy: 2 cm HH, 10 mm cratered gastric antral ulcer (clips x2), moderate gastritis with erosions (biopsies negative for H. pylori), residual food in stomach, active oozing Dieulafoy lesion in third portion of duodenum (clips x1; tattoo placed 1-2 cm distal to the lesion for future identification).  Was treated with IV iron  infusion, high-dose PPI, sucralfate  and discharged the following day. - 05/15/2021: EGD: Normal esophagus, gastric polyps, otherwise normal stomach/duodenum - 09/19/2021: Small bowel enteroscopy: 5 cm HH, benign gastric polyps, blood in the third/fourth portion of the duodenum. - 06/02/2022: VCE: faint red area near the tattoo site (from the prior dieulafoy lesion endoscopic intervention/clip placement) possibly representing a small nonbleeding AVM or local inflammation. No active bleeding or stigmata of bleeding was noted. tattoo was seen in the 3rd portion of the duodenum. The tattoo site appeared normal. - A dieulafoy lesion with bleeding in the distal duodenum, a few cms distal to prior tattoo. Treated with argon plasma coagulation (APC). Clips were placed.  Tattoo placed in the distal aspect reached in the jejunum - 07/06/2023: Colonoscopy (positive Cologuard): 2 small 2-4 mm sigmoid adenomas, sigmoid/descending colon diverticulosis, medium sized ascending colon lipoma.  Normal TI.  Per conversation with the patient, can likely forego repeat colonoscopy for ongoing surveillance given repeat  would be at age 11  HPI:     Patient is a 79 y.o. female presenting to the [facility]ology [facility] for routine follow-up.  Was last seen by me on 01/28/2023.  Reflux well-controlled with omeprazole .  Requesting refill today.  She tried stopping the omeprazole , but had recurrence of reflux symptoms within 3-4 days.  Has since restarted and good control of symptoms again.  Insurance no longer covering Victoza, and was changed to  Ozempic about 5 months ago.  Since then has had decreased appetite and nausea along with diarrhea and fecal urgency, all since starting the Ozempic.  No weight loss since starting Ozempic.  Had none of the symptoms prior to Ozempic.    Reviewed most recent labs from 12/2023: - Normal CBC including H/H 12.9/38.8 - Calcium 10.6, otherwise normal CMP - Normal iron  indices  Was seen by Dr. Federico in the Hematology Clinic on 01/16/2024.   Review of systems:     No chest pain, no SOB, no fevers, no urinary sx   Past Medical History:  Diagnosis Date   Allergy    Cataract    Endometrial mass    Family history of colon cancer 12/09/2020   Family history of lung cancer 12/09/2020   Family history of multiple myeloma 12/09/2020   Full dentures    GERD (gastroesophageal reflux disease)    H/O: upper GI bleed    [facility] time 09-19-2021  s/p EGD with bleeding Dielufoy lesion of distal duodedum tx'd APC and clips;  previous gi bleed 03-08-2019  s/p EGD w/ hemastatis and clips   Heart murmur    History of gastric ulcer    w/ upper GI bleed 03-08-2019 gastric ulcer nonbleeding and duodunal bleeding lesion   HLD (hyperlipidemia)  Hypertension    Hypothyroidism    followed by pcp   Iron  deficiency anemia    hematologist--- dr j. federico;   due to hx GI bleed and blood product refusal   MDD (major depressive disorder)    OA (osteoarthritis)    hands, knees   Ovarian cyst, right    Refusal of blood transfusions as patient is Jehov[employer]'s Witness    Type 2 diabetes  mellitus (HCC)    followed by pcp  (12-29-2021  per pt check blood sugar daily in am fasting, average 105--110)   Wears hearing aid in both ears     Patient's surgical history, family medical history, social history, medications and allergies were all reviewed in Epic    Current Outpatient Medications  Medication Sig Dispense Refill   AMBULATORY NON FORMULARY MEDICATION Take 2 capsules by mouth daily. Medication Name: FLORADIX (iron  supplement)     cetirizine  (ZYRTEC ) 10 MG tablet Take 10 mg by mouth daily.     finasteride (PROPECIA) 1 MG tablet Take by mouth.     glucose blood (ACCU-CHEK AVIVA PLUS) test strip Use to test your blood sugar In Vitro twice a day     losartan -hydrochlorothiazide (HYZAAR) 50-12.5 MG tablet Take 1 tablet by mouth daily.     MAGNESIUM -OXIDE 400 (240 Mg) MG tablet Take 1 tablet by mouth once daily 30 tablet 0   metFORMIN (GLUCOPHAGE) 1000 MG tablet Take 1,000 mg by mouth 2 (two) times daily with a meal.     Multiple Vitamins-Minerals (HAIR/SKIN/NAILS) CAPS Take 1 capsule by mouth in the morning.     Multiple Vitamins-Minerals (ONE-A-DAY WOMENS 50+) TABS Take 1 tablet by mouth in the morning.     omeprazole  (PRILOSEC) 40 MG capsule Take 40 mg by mouth in the morning.     OVER THE COUNTER MEDICATION Pt taking oral Monoxidil     Semaglutide (OZEMPIC, 0.25 OR 0.5 MG/DOSE, [place]) Inject 0.5 mg into the skin once a week.     simvastatin  (ZOCOR ) 40 MG tablet Take 1 tablet (40 mg total) by mouth daily at 6 PM. (Patient taking differently: Take 40 mg by mouth at bedtime.) 90 tablet 3   SYNTHROID  100 MCG tablet Take 100 mcg by mouth daily before breakfast.     traZODone  (DESYREL ) 100 MG tablet TAKE 1 TABLET BY MOUTH AT BEDTIME (Patient taking differently: Take 100 mg by mouth at bedtime.) 90 tablet 0   Vitamin D, Ergocalciferol, (DRISDOL) 1.25 MG (50000 UNIT) CAPS capsule Take 50,000 Units by mouth every 7 (seven) days.     No current facility-administered medications for  this visit.    Physical Exam:     BP 110/72   Pulse 86   Ht 5' 2 (1.575 m)   Wt 186 lb (84.4 kg)   SpO2 97%   BMI 34.02 kg/m   GENERAL:  Pleasant female in NAD PSYCH: : Cooperative, normal affect Musculoskeletal:  Normal muscle tone, normal strength NEURO: Alert and oriented x 3, no focal neurologic deficits   IMPRESSION and PLAN:    1) Iron  deficiency anemia 2) History of duodenal Dieulafoy bleed 3) History of gastric ulcer H/H stable and normal with normal iron  indices last month.    No further bleeding. - Continue Prilosec for gastric prophylaxis and reflux management - Keep follow-up appointment with Dr. Federico next week in the Hematology Clinic - Continue oral iron  with IV iron  as needed - Patient knows to contact our office if concern for rebleeding or ER if  after hours  4) GERD - Well controlled with omeprazole  daily - Refill omperazole 40 mg daily, 90-day supply, RF 5   5) Hepatic steatosis Ultrasound in 10/2022 with increased hepatic echogenicity.  Otherwise normal liver enzymes and no radiographic or serologic evidence of impaired hepatic synthetic function.   - Continue treating underlying comorbidities as currently doing - Consider FibroScan when locally available  6) Diabetes 7) Medication side effect Has clearly had GI side effects from Ozempic.  Discussed ADR profile, and GI side effects are quite common with GLP-1 medications.  She has follow-up with her PCP next month, and can hopefully able to get insurance approval to get her back on Victoza which worked well for her and tolerated without difficulty.  I spent 35 minutes of time, including in depth chart review, independent review of results as outlined above, communicating results with the patient directly, face-to-face time with the patient, coordinating care, and ordering studies and medications as appropriate, and documentation.          Sandor GAILS Jacie Tristan ,DO, FACG 01/19/2024, 1:50 PM  "

## 2024-01-19 NOTE — Patient Instructions (Signed)
 _______________________________________________________  If your blood pressure at your visit was 140/90 or greater, please contact your primary care physician to follow up on this.  _______________________________________________________  If you are age 79 or older, your body mass index should be between 23-30. Your Body mass index is 34.02 kg/m. If this is out of the aforementioned range listed, please consider follow up with your Primary Care Provider.  If you are age 18 or younger, your body mass index should be between 19-25. Your Body mass index is 34.02 kg/m. If this is out of the aformentioned range listed, please consider follow up with your Primary Care Provider.   ________________________________________________________  The [facility] providers would like to encourage you to use MYCHART to communicate with providers for non-urgent requests or questions.  Due to long hold times on the telephone, sending your provider a message by [facility] may be a faster and more efficient way to get a response.  Please allow 48 business hours for a response.  Please remember that this is for non-urgent requests.  _______________________________________________________  [facility] is using a team-based approach to care.  Your team is made up of your doctor and two to three APPS. Our APPS (Nurse Practitioners and Physician Assistants) work with your physician to ensure care continuity for you. They are fully qualified to address your health concerns and develop a treatment plan. They communicate directly with your gastroenterologist to care for you. Seeing the Advanced Practice Practitioners on your physician's team can help you by facilitating care more promptly, often allowing for earlier appointments, access to diagnostic testing, procedures, and other specialty referrals.   We have sent the following medications to your pharmacy for you to pick up at your convenience:  Omeprazole  40mg   one capsule daily  It was a pleasure to see you today!  Vito Cirigliano, D.O.

## 2024-02-13 ENCOUNTER — Inpatient Hospital Stay: Admitting: Hematology and Oncology

## 2024-02-13 ENCOUNTER — Other Ambulatory Visit: Payer: Self-pay | Admitting: Hematology and Oncology

## 2024-02-13 ENCOUNTER — Inpatient Hospital Stay
